# Patient Record
Sex: Female | Born: 1985 | Race: Black or African American | Hispanic: No | Marital: Single | Smoking: Current every day smoker
Health system: Southern US, Community
[De-identification: ages and names within clinical notes are randomized; demographics above are authoritative.]

---
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
## 2018-07-23 ENCOUNTER — Other Ambulatory Visit: Payer: Self-pay

## 2018-07-23 ENCOUNTER — Emergency Department (HOSPITAL_COMMUNITY)
Admission: EM | Admit: 2018-07-23 | Discharge: 2018-07-23 | Disposition: A | Discharge status: Home / Self Care | Payer: Medicaid Other | Attending: Emergency Medicine | Admitting: Emergency Medicine

## 2018-07-23 ENCOUNTER — Emergency Department (HOSPITAL_COMMUNITY): Payer: Medicaid Other

## 2018-07-23 ENCOUNTER — Encounter (HOSPITAL_COMMUNITY): Payer: Self-pay | Admitting: Emergency Medicine

## 2018-07-23 DIAGNOSIS — R079 Chest pain, unspecified: Secondary | ICD-10-CM | POA: Diagnosis present

## 2018-07-23 DIAGNOSIS — F17210 Nicotine dependence, cigarettes, uncomplicated: Secondary | ICD-10-CM | POA: Insufficient documentation

## 2018-07-23 NOTE — ED Triage Notes (Signed)
Pt reports chest spasms approximately 2 hours after eating yesterday. Pt states has had intermittent episodes since. Denies n/v/d/sob.

## 2018-07-23 NOTE — Discharge Instructions (Addendum)
Tests showed no life-threatening condition.  Suggest ibuprofen 2 to 3 tablets 3 times a day until pain gone.  Return if worse.

## 2018-07-27 NOTE — ED Provider Notes (Signed)
[facility] EMERGENCY DEPARTMENT Provider Note   CSN: [id number] Arrival date & time: 07/23/18  1051     History   Chief Complaint Chief Complaint  Patient presents with  . Chest Pain    HPI Jill Mora is a 32 y.o. female.  Chest pain intermittently since last night described as cramping, worse with position and palpation.  No dyspnea, diaphoresis, nausea.  No known history of cardiac disease.  She is a smoker.  Severity is mild.  No prolonged travel or immobilization     History reviewed. No pertinent past medical history.  There are no active problems to display for this patient.   History reviewed. No pertinent surgical history.   OB History   None      Home Medications    Prior to Admission medications   Not on File    Family History History reviewed. No pertinent family history.  Social History Social History   Tobacco Use  . Smoking status: Current Every Day Smoker    Packs/day: 1.00    Types: Cigarettes  . Smokeless tobacco: Never Used  Substance Use Topics  . Alcohol use: Not Currently    Comment: socially  . Drug use: Not on file     Allergies   Iodine; Ivp dye [iodinated diagnostic agents]; Morphine and related; and Shellfish allergy   Review of Systems Review of Systems  All other systems reviewed and are negative.    Physical Exam Updated Vital Signs BP 125/89 Comment: Simultaneous filing. User may not have seen previous data.  Pulse 66   Temp 98.7 F (37.1 C) (Oral)   Resp 18   LMP 06/14/2018   SpO2 100%   Physical Exam  Constitutional: She is oriented to person, place, and time. She appears well-developed and well-nourished.  HENT:  Head: Normocephalic and atraumatic.  Eyes: Conjunctivae are normal.  Neck: Neck supple.  Cardiovascular: Normal rate and regular rhythm.  Pulmonary/Chest: Effort normal and breath sounds normal.  Tender to palpation  Abdominal: Soft. Bowel sounds are normal.  Musculoskeletal:  Normal range of motion.  Neurological: She is alert and oriented to person, place, and time.  Skin: Skin is warm and dry.  Psychiatric: She has a normal mood and affect. Her behavior is normal.  Nursing note and vitals reviewed.    ED Treatments / Results  Labs (all labs ordered are listed, but only abnormal results are displayed) Labs Reviewed - No data to display  EKG EKG Interpretation  Date/Time:  Friday July 23 2018 11:05:29 EST Ventricular Rate:  79 PR Interval:    QRS Duration: 83 QT Interval:  374 QTC Calculation: 429 R Axis:   74 Text Interpretation:  Sinus rhythm ST elev, probable normal early repol pattern Baseline wander in lead(s) II III aVF V2 Confirmed by Donnetta Hutchingook, Kalika Smay ([id number]) on 07/23/2018 12:59:37 PM   Radiology No results found.  Procedures Procedures (including critical care time)  Medications Ordered in ED Medications - No data to display   Initial Impression / Assessment and Plan / ED Course  I have reviewed the triage vital signs and the nursing notes.  Pertinent labs & imaging results that were available during my care of the patient were reviewed by me and considered in my medical decision making (see chart for details).     Patient presents with chest pain.  She is low risk for ACS or PE.  EKG normal.  Chest x-ray shows atelectasis.  No clinical evidence of pneumonia.  Final Clinical  Impressions(s) / ED Diagnoses   Final diagnoses:  Chest pain, unspecified type    ED Discharge Orders    None       Donnetta Hutching, MD 07/27/18 [phone]

## 2018-10-13 ENCOUNTER — Encounter (HOSPITAL_COMMUNITY): Payer: Self-pay | Admitting: Emergency Medicine

## 2018-10-13 ENCOUNTER — Other Ambulatory Visit: Payer: Self-pay

## 2018-10-13 ENCOUNTER — Emergency Department (HOSPITAL_COMMUNITY)
Admission: EM | Admit: 2018-10-13 | Discharge: 2018-10-13 | Disposition: A | Discharge status: Home / Self Care | Payer: Medicaid Other | Attending: Emergency Medicine | Admitting: Emergency Medicine

## 2018-10-13 DIAGNOSIS — F17210 Nicotine dependence, cigarettes, uncomplicated: Secondary | ICD-10-CM | POA: Diagnosis not present

## 2018-10-13 DIAGNOSIS — K610 Anal abscess: Secondary | ICD-10-CM | POA: Insufficient documentation

## 2018-10-13 LAB — POC URINE PREG, ED: Preg Test, Ur: NEGATIVE

## 2018-10-13 MED ORDER — SULFAMETHOXAZOLE-TRIMETHOPRIM 800-160 MG PO TABS: 1.0000 | ORAL_TABLET | Freq: Two times a day (BID) | ORAL | 0 refills | Status: AC

## 2018-10-13 NOTE — Discharge Instructions (Addendum)
Wound Care - After I&D of Abscess  Bandages, if used, should be replaced daily or whenever soiled.    Cleaning: Clean the wound and surrounding area gently with tap water and mild soap. Rinse well and blot dry. You may shower normally. Soaking the wound in Epsom salt baths for no more than 15 minutes once a day may help rinse out any remaining pus and help with wound healing.  Clean the wound daily to prevent further infection. Do not use cleaners such as hydrogen peroxide or alcohol.  Please take all of your antibiotics until finished!   You may develop abdominal discomfort or diarrhea from the antibiotic.  You may help offset this with probiotics which you can buy or get in yogurt. Do not eat or take the probiotics until 2 hours after your antibiotic.   Scar reduction: Application of a topical antibiotic ointment, such as Neosporin, after the wound has begun to close and heal well can decrease scab formation and reduce scarring. After the wound has healed, application of ointments such as Aquaphor can also reduce scar formation.  The key to scar reduction is keeping the skin well hydrated and supple. Drinking plenty of water throughout the day (At least eight 8oz glasses of water a day) is essential to staying well hydrated.  Sun exposure: Keep the wound out of the sun. After the wound has healed, continue to protect it from the sun by wearing protective clothing or applying sunscreen.  Pain: You may use Tylenol, naproxen, or ibuprofen for pain.  Prevention: There is some people that have a predisposition to abscess formation, however, there are some things that can be done to prevent abscesses in many people.  Most abscesses form because bacteria that naturally lives on the skin gets trapped underneath the skin.  This can occur through openings too small to see. Before and after any area of skin is shaved, wax, or abraded in any manner, the area should be washed with soap and water and rinsed  well.   If you are having trouble with recurrent abscesses, it may be wise to perform a chlorhexidine wash regimen.  For 1 week, wash all of your body with chlorhexidine (available over-the-counter at most pharmacies). You may also need to reevaluate your use of daily soap as soaps with perfumes or dyes can increase the chances of infection in some people.  Follow up: Follow-up with the general surgeon on this matter.  Call to set up an appointment.  Return: Return to the ED sooner should signs of worsening infection arise, such as spreading redness, worsening puffiness/swelling, severe increase in pain, fever over 100.24F, or any other major issues.  For prescription assistance, may try using prescription discount sites or apps, such as goodrx.com  Your blood pressure was noted to be higher than normal today.  Please follow-up with your primary care provider for reevaluation and any further management of this issue.

## 2018-10-13 NOTE — ED Provider Notes (Signed)
[facility] EMERGENCY DEPARTMENT Provider Note   CSN: [id number] Arrival date & time: 10/13/18  2035    History   Chief Complaint Chief Complaint  Patient presents with  . Abscess    HPI Jill Mora is a 34 y.o. female.     HPI   Jill Mora is a 33 y.o. female, with a history of perianal abscess, presenting to the ED with  Perianal abscess that she has noted for the last 2 to 3 weeks.  She has had bloody and purulent drainage from the region.  Pain is sharp, moderate to severe, nonradiating. She has had an abscess in this region before which underwent I&D by a surgeon in [place], [place] a year ago. LMP December 2019. Denies fever/chills, N/V/C/D, abdominal pain, abnormal vaginal discharge, or any other complaints.      History reviewed. No pertinent past medical history.  There are no active problems to display for this patient.   History reviewed. No pertinent surgical history.   OB History   No obstetric history on file.      Home Medications    Prior to Admission medications   Medication Sig Start Date End Date Taking? Authorizing Provider  sulfamethoxazole-trimethoprim (BACTRIM DS,SEPTRA DS) 800-160 MG tablet Take 1 tablet by mouth 2 (two) times daily for 7 days. 10/13/18 10/20/18  Anselm Pancoast, PA-C    Family History No family history on file.  Social History Social History   Tobacco Use  . Smoking status: Current Every Day Smoker    Packs/day: 1.00    Types: Cigarettes  . Smokeless tobacco: Never Used  Substance Use Topics  . Alcohol use: Not Currently    Comment: socially  . Drug use: Not on file     Allergies   Iodine; Ivp dye [iodinated diagnostic agents]; Morphine and related; and Shellfish allergy   Review of Systems Review of Systems  Constitutional: Negative for fever.  Gastrointestinal: Negative for abdominal pain, blood in stool, constipation, diarrhea, nausea and vomiting.  Genitourinary: Negative for dysuria,  hematuria and vaginal discharge.  Musculoskeletal: Negative for back pain.  Skin: Positive for wound.  Neurological: Negative for weakness and numbness.  All other systems reviewed and are negative.    Physical Exam Updated Vital Signs BP (!) 183/104 (BP Location: Left Arm)   Pulse 89   Temp 98.1 F (36.7 C) (Oral)   Resp 20   SpO2 98%   Physical Exam Vitals signs and nursing note reviewed. Exam conducted with a chaperone present.  Constitutional:      General: She is not in acute distress.    Appearance: She is well-developed. She is not diaphoretic.  HENT:     Head: Normocephalic and atraumatic.     Mouth/Throat:     Mouth: Mucous membranes are moist.     Pharynx: Oropharynx is clear.  Eyes:     Conjunctiva/sclera: Conjunctivae normal.  Neck:     Musculoskeletal: Neck supple.  Cardiovascular:     Rate and Rhythm: Normal rate and regular rhythm.     Pulses: Normal pulses.  Pulmonary:     Effort: Pulmonary effort is normal. No respiratory distress.  Abdominal:     Palpations: Abdomen is soft.     Tenderness: There is no abdominal tenderness. There is no guarding.  Genitourinary:    Comments: Tenderness and small opening in the skin on the left medial buttock approximately 3 cm from the anal opening.  Minimal discharge present no noted fluctuance or  induration.  No erythema or increased warmth.  No tenderness to the anal sphincter or distal rectum. Musculoskeletal:     Right lower leg: No edema.     Left lower leg: No edema.  Lymphadenopathy:     Cervical: No cervical adenopathy.  Skin:    General: Skin is warm and dry.  Neurological:     Mental Status: She is alert.  Psychiatric:        Mood and Affect: Mood and affect normal.        Speech: Speech normal.        Behavior: Behavior normal.      ED Treatments / Results  Labs (all labs ordered are listed, but only abnormal results are displayed) Labs Reviewed  POC URINE PREG, ED     EKG None  Radiology No results found.  Procedures Procedures (including critical care time)  Medications Ordered in ED Medications - No data to display   Initial Impression / Assessment and Plan / ED Course  I have reviewed the triage vital signs and the nursing notes.  Pertinent labs & imaging results that were available during my care of the patient were reviewed by me and considered in my medical decision making (see chart for details).        Patient presents with a perianal abscess.  States this is actively draining.  We discussed methods to encourage continued drainage.  We will start antibiotic therapy and refer to general surgery. The patient was given instructions for home care as well as return precautions. Patient voices understanding of these instructions, accepts the plan, and is comfortable with discharge.  Vitals:   10/13/18 0855 10/13/18 1110  BP: (!) 183/104 (!) 166/102  Pulse: 89 71  Resp: 20 16  Temp: 98.1 F (36.7 C)   TempSrc: Oral   SpO2: 98% 97%   Patient's hypertension noted.  She does not appear to be symptomatic to this.  Doubt hypertensive emergency.  We will have her follow-up with her PCP on this matter.     Final Clinical Impressions(s) / ED Diagnoses   Final diagnoses:  Perianal abscess    ED Discharge Orders         Ordered    sulfamethoxazole-trimethoprim (BACTRIM DS,SEPTRA DS) 800-160 MG tablet  2 times daily     10/13/18 1122           Anselm Pancoast, [place] 10/13/18 1123    Tegeler, Canary Brim, MD 10/13/18 1729

## 2018-10-13 NOTE — ED Triage Notes (Signed)
Pt with history of peri-rectal abscess a year ago reports drainage without erythema and pain. Denies fevers. NAD at triage.

## 2019-09-15 ENCOUNTER — Ambulatory Visit
Admit: 2019-09-16 | Payer: MEDICAID | Attending: Student in an Organized Health Care Education/Training Program | Primary: Student in an Organized Health Care Education/Training Program

## 2019-09-15 DIAGNOSIS — T782XXA Anaphylactic shock, unspecified, initial encounter: Secondary | ICD-10-CM

## 2019-09-15 DIAGNOSIS — T1490XA Injury, unspecified, initial encounter: Secondary | ICD-10-CM

## 2019-09-15 DIAGNOSIS — Z809 Family history of malignant neoplasm, unspecified: Secondary | ICD-10-CM

## 2019-09-15 DIAGNOSIS — N926 Irregular menstruation, unspecified: Secondary | ICD-10-CM

## 2019-09-15 DIAGNOSIS — F419 Anxiety disorder, unspecified: Secondary | ICD-10-CM

## 2019-09-15 DIAGNOSIS — F1290 Cannabis use, unspecified, uncomplicated: Secondary | ICD-10-CM

## 2019-09-15 DIAGNOSIS — E669 Obesity, unspecified: Secondary | ICD-10-CM

## 2019-09-15 DIAGNOSIS — Z720 Tobacco use: Secondary | ICD-10-CM

## 2019-09-15 MED ORDER — EPINEPHRINE 0.3 MG/0.3 ML INJECTION, AUTO-INJECTOR
0.30.3 mg/ mL | Freq: Once | INTRAMUSCULAR | 6 refills | Status: DC | PRN
Start: 2019-09-15 — End: 2020-05-13

## 2019-09-15 MED ORDER — LEVONORGESTREL 0.15 MG-ETHINYL ESTRADIOL 30 MCG TABLETS,3 MOS PACK(91): 0.15 mg-30 mcg (91) | ORAL | 3 refills | Status: DC

## 2019-09-15 MED ORDER — SERTRALINE 25 MG TABLET: 25 mg | ORAL | 2 refills | Status: DC

## 2019-09-15 NOTE — Assessment & Plan Note (Signed)
Referral to weight mgmt clinic
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
## 2019-09-15 NOTE — Patient Instructions (Addendum)
Dear Ms Reichard,    It was good to see you in virtual clinic today. This document is your After Visit Summary, which includes what we discussed in clinic today, what the next steps are, and a list of your medications.     Things you should do after you leave clinic today:    For your mood symptoms:    - I will place referral for behavioral health navigator to contact you with list of in-network providers. They should reach out to you in the next weeks with in-network providers.  Please reach out to them to make an appointment    - Please start taking sertraline (zoloft) 25mg  once daily     - Our admin team will send you two questionnaires: GAD7 and PHQ9. Please complete    - We will set up a video visit with my practice partner for a check in next month     For your irregular periods:    - Please go to one of the [facility] labs to get the following tests done: TSH, FSH, prolactin, CBC    - Please start oral contraception pills (jolessa) one tab daily     For your allergies    - I sent in an epi pen prescription       For your obesity    - I will place a referral to weight management clinic. They will reach out to you to schedule an appointment       Healthcare maintenance:   - A1C  - Cholesterol panel     Today we ordered a set of blood tests.  Please go to one of the following locations for your labs.     IF THESE ARE 'FASTING LABS' (cholesterol, blood sugar), please do not eat for at least 10 hours prior to the test.  You CAN drink water, and you CAN take medicines before the lab test.    Laboratory [facility]  [address] First Floor  At the corner of [address]  Phone [phone]   Hours: Monday to Friday 7 a.m.  5:30 p.m.    Laboratory at [facility]  [address], First Floor  [place], [place]  Phone: [phone]  Hours: Monday to Friday 7:30 a.m.   6:30 p.m.    Blood Draw Lab at [facility]   [address] Floor   [place], [place]  [place]   Phone: [phone]   Hours: Monday to Friday, 7 a.m.  5:30 p.m.    Please confirm early or late hours by calling or checking ou website at  [email or web address]    You will receive you laboratory results either by MyChart, if you have registered, or by letter.  If there are urgent results, you will receive a call from me or from a nurse on my team. Please make sure we have the correct phone number for you at our front desk.            It was a pleasure seeing you today. Please don't hesitate to reach out if you have any additional questions or concerns.    Best,    [email or web address]

## 2019-09-15 NOTE — Telephone Encounter (Signed)
Dear Madlyn Frankel,     Could you please send Ms adylynn hertenstein and PHQ9 to complete via mychat and schedule her for a 2X video visit with my practice partner Dr. Cyndie Chime in 3-4 weeks to discuss her: anxiety (rxn to SSRIs), obesity, and irregular menstruation? Thank you very much for your help!    Best,    Shanda Bumps

## 2019-09-16 ENCOUNTER — Ambulatory Visit: Admit: 2019-09-16 | Payer: MEDICAID | Primary: Student in an Organized Health Care Education/Training Program

## 2019-09-16 DIAGNOSIS — N926 Irregular menstruation, unspecified: Secondary | ICD-10-CM

## 2019-09-16 LAB — PROLACTIN: Prolactin: 8.2 ug/L (ref 4.3–30.0)

## 2019-09-16 LAB — THYROID STIMULATING HORMONE: Thyroid Stimulating Hormone: 1.64 mIU/L (ref 0.45–4.12)

## 2019-09-16 LAB — HCG PREGNANCY, SERUM, QUANTITA: HCG Pregnancy, Serum, >= 18 ye: <1 IU/L (ref <5)

## 2019-09-16 NOTE — Result Quicknote (Signed)
Dear Kayla Frey,    Good news! Your FSH, prolactin, TSH and HCG (which was our initial workup for your irregular periods) have all come back in a healthy and normal range. We will see if your periods become more regular after starting the oral contraception pills and will continue to discuss this in your next appointment with me. Please feel free to reach out if you have any additional questions or concerns!    Best,    Park Liter, MD PGY-2

## 2019-09-18 LAB — FOLLICLE STIMULATING HORMONE,: Follicle stimulating hormone: 4 IU/L

## 2019-09-23 NOTE — Telephone Encounter (Signed)
Left a message and sent pt via mychart.  Scheduled VV on 2/18 and GAD7, PHQ9 questionnaire.

## 2019-09-26 DIAGNOSIS — F329 Major depressive disorder, single episode, unspecified: Secondary | ICD-10-CM

## 2019-09-26 DIAGNOSIS — F32A Depression, unspecified: Secondary | ICD-10-CM

## 2019-09-26 NOTE — Telephone Encounter (Signed)
Called and LM to pt in regards to scheduling error as New Patient in [facility]    Informed pt to check her Mychart for new appt time

## 2019-09-26 NOTE — Telephone Encounter (Signed)
Copied from CRM #[id number]. Topic: WM - Referral - New Renewal  >> Sep 26, 2019  8:31 AM Barnet Glasgow wrote:  REFERRAL / AUTHORIZATION TEMPLATE    PATIENT NAME: Kayla Frey  DATE OF BIRTH: August 19, 1986  MRN: [id number]    HOME PHONE NUMBER:    [phone]    ALTERNATE PHONE NUMBER:        LEAVING A MESSAGE OK?:        SPECIALTY:     wm  SPECIALTY NAME:       SPECIALTY ADDRESS:       SPECIALIST PHONE NUMBER:       SPECIALIST FAX NUMBER:       DATE OF APPOINTMENT:       REASON FOR VISIT:       SEND / FAX / MAIL REFERRAL TO:       INSURANCE INFORMATION:  PAYOR: [facility]   PLAN: [facility]  SUBSCRIBER INFO:         MESSAGE / PROBLEM:     Pt scheduled a new pt video appt with Dr. Michelle Piper on 3/23, but agent was unable to attach the referral due to a status of "closed".        MESSAGE GENERATED BY [facility]  CRM NUMBER [id number] CREATED BY:    Barnet Glasgow,     09/26/2019,      8:31 AM

## 2019-10-13 ENCOUNTER — Ambulatory Visit
Admit: 2019-10-14 | Payer: MEDICAID | Attending: Student in an Organized Health Care Education/Training Program | Primary: Student in an Organized Health Care Education/Training Program

## 2019-10-13 DIAGNOSIS — F419 Anxiety disorder, unspecified: Secondary | ICD-10-CM

## 2019-10-13 MED ORDER — ESCITALOPRAM 5 MG TABLET: 5 mg | ORAL | 3 refills | Status: DC

## 2019-10-13 NOTE — Progress Notes (Signed)
Completed a Gad-7 with pt.

## 2019-10-13 NOTE — Telephone Encounter (Signed)
Admin,   Please call to schedule a video appointment with me on 2/25 in the afternoon. Thanks!    Kayla Frey

## 2019-10-13 NOTE — Patient Instructions (Addendum)
-   pick up the lexapro 5mg  tablets and start taking them tomorrow morning.   - stop taking sertraline  - if tomorrow, when you take the lexapro in the morning you find that it makes you sleepy, you can switch to taking them at night (the next day, start taking them at night)  - someone will call you to make an appointment to see me in one week
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
## 2019-10-14 NOTE — E-Consult Note (Signed)
I am requesting an eConsult from psychiatry for this 34 y.o. woman with Anxiety.     My clinical question (including major comorbidities): Young woman with years of anxiety that manifests as obsessive thoughts about cleaning, and compulsions to clean. I'm having trouble diagnostically if this is GAD, OCD. Don't think OCPD or a delusional disorder since she finds her symptoms bothersome. Need help with diagnostics and next steps. Sertraline did not work, switched to [employer] today. No psychotic features.       The most current assessment of this problem can be found in the [facility] note dated 10/13/2019   The following studies are available in [facility]: TSH  Free T4       Date                     Value               Ref Range           Status                06/26/2000               11                  9 - 24 pmol/L       Final              Comment:    (by UC) ADD ON  ----------  Thyroid Stimulating Hormone       Date                     Value               Ref Range           Status                09/16/2019               1.64                0.45 - 4.12 mI*     Final              Comment:    Note: TSH results are flagged or not flagged according to reference intervals for non-pregnant patients. In pregnant patients, the upper limit of the TSH reference range in weeks 7-12 of the first trimester is approximately 0.5 mIU/L lower than in   non-pregnant patients and the reference interval is estimated to be 0.05 to 3.62 mIU/L.  During the 2nd and 3rd trimesters, the TSH reference range gradually returns towards the reference range for non-pregnant patients which is 0.45 - 4.12 mIU/L in the current assay.    ----------  If this clinical question is deemed too complex for eConsult, please, Schedule this patient for in-person consultation. The patient understands that she may receive a phone call from the specialty practice to schedule an appointment.    SPECIALIST'S E-CONSULT RESPONSE        Thanks for the e-consult. Classic OCD  around cleaning would focus on worries that things are out of place, or unhygienic/contaminated, and that she needs to clean them just so for them to be ok.  The interpersonal anger is much less common.  What does she think will happen if something is messy?    One way to further evaluate her symptoms around the OCD symptom cluster is to use the YBOCS - https://carter.com/  The last page is a list of common obsessions and compulsions, and I generally ask at least a couple in each category (both currently and in the past) to get a better sense of the scope of what's going on. People commonly don't volunteer aggressive or sexual obsessions unless you ask.   The first 2 pages then are used to track severity of symptoms through treatment, as you would with a PHQ9 or GAD7.      As another diagnostic possibility, people can also develop a psychological rigidity around things being cleaned just so, sometimes in response to having many things in life be completely out of her control.  If this is truly an isolated anxiety/frustration, I would ask about when this developed (if she knows), and what was going on in her life around that time.  This rigidity would more commonly be associated with interpersonal irritability than would OCD, and it could be helpful to ask about her internal monologue when someone else puts things out of place. What are the inner voice saying?  If rigidity seems to be more likely than OCD, therapy would probably be the best place to start.    I see that she was trialed on sertraline 25mg  and was fatigued without benefit.  Because this is such a low-dose of sertraline, I would say that she is intolerant of the medication (as compared to a failed trial of sertraline, which would require at least 4 weeks at sertraline 200mg ).  Lexapro is a fine medication to try next.  For OCD, we not uncommonly need higher doses of SSRIs to see benefit (rule of thumb  up to 1.5x FDA max).  If for depression we commonly see benefit in the lexapro 10-20mg  range, for OCD we're probably looking at the lexapro 20-40mg  range (and I would push it to 30mg  if possible before calling it a failed trial).  Similarly for sertraline for OCD, the range of effective doses is probably 150-300mg  daily.  Lexapro tends to be a bit more sedating than sertraline on average, so be quick to switch it to bedtime.  If she finds that too sedating, I would try fluoxetine next (most activating of the SSRIs).    Lenard Simmer Samelson-Jones, MD        I spent 21-30 minutes reviewing and completing thiseConsult.      This eConsult is based on the clinical data available to me and is furnished without benefit of a comprehensive evaluation or physical examination.  The above will need to be interpreted in light of any clinical issues, or changes in patient status, not available to me at the time of filing this eConsult.  Please alert me if you have further questions.

## 2019-10-14 NOTE — Telephone Encounter (Signed)
Spoke with pt and scheduled appt for 2/25 @ 2pm.

## 2019-10-21 NOTE — Telephone Encounter (Signed)
Appointment already scheduled for 3/31 @ 3:30pm.

## 2019-11-15 ENCOUNTER — Ambulatory Visit
Admit: 2019-11-15 | Discharge: 2019-11-23 | Payer: MEDICAID | Attending: Physician | Primary: Student in an Organized Health Care Education/Training Program

## 2019-11-15 DIAGNOSIS — Z6841 Body Mass Index (BMI) 40.0 and over, adult: Secondary | ICD-10-CM

## 2019-11-15 DIAGNOSIS — F419 Anxiety disorder, unspecified: Secondary | ICD-10-CM

## 2019-11-15 NOTE — Patient Instructions (Addendum)
Goals after this visit:  -- Referral placed to surgery clinic to discuss bariatric surgery and begin insurance approval process.    --Info on the [facility] Bariatric Support Group  Contact: Email [email or web address]    This [place] website has more info about the surgeries:  [email or web address]    Medical Office Building 1  (Ambulatory Care Center)  [facility]  [address]  Sixth Floor, Reception Desk [address]  [place], [place] [address]  Phone (existing patients): [phone]  Phone (new patients): [phone]   Fax: [phone]

## 2019-11-15 NOTE — Progress Notes (Signed)
Kayla Frey is a 34 y.o. female presenting as a new patient in the weight management program.    Obesity: Patient here with obesity.  Main purpose for today's visit is a medical intake.     Referred by: Elsie Amis, MD    Longterm Goals: Healthier lifestyles, less body aches, live longer.  Improve knee, back and hip pain.      Video visit.      Weight History:      Has always had excess weight.  In her 40's weight was the same.  Has tried various diets but weight always came back.  Family hx of excess weight.  Tried Atkins diet in the past.  Did not work.  At most lost 5#.  Affected her mood.      Aunt had gastric bypass surgery.    Lowest weight as an adult age 63, 255#.  Currently 400# weighed 2 months ago.     Raised by her aunt from age 24 to 28.    As a childhood was food restricted bc her aunt had issues with her weight. At age 86 was 79 with her ribs showing.  5'7".      Does not eat junk food.      Shellfish and nut allergy.  Walnuts especially.  But most nuts do something.    Iodine and morphine gave her hives and needed IV medication treatment.    Shares a home with her boyfriend. Working with Dr. Denna Haggard.  Only recently started Lexapro.  Helping.  Zoloft made her sleepy and without energy.    Interested in weight loss surgery.  Cousin had a sleeve.  Aware of the peri op changes to eating.        Other Potential Contributing Factors/PMH  Thyroid?  None.  Musculoskel problem? Knees, back and hips  Use of medications that may cause weight gain On OCP only recently.  No steroids.    Use of alcohol: average 0 drinks/week, minimal.  Psych History: depressed mood   Sleep "broken"  Wakes up in the middle of the night. Has always been this way.  +snoring.     Medical History (see [facility] problem list).     Eating Habits/Nutritionist:   As above.    Exercise Habits:   Had a membership at a gym and would go swimming 3x per week.  Walks her dog.  Since COVID doing on demand videos.     Likes to go fishing.     Review of Systems:  Musculoskel: Joint pain: as above.  Other systems were reviewed and are negative.     Patient's allergies, medications, past medical, surgical, social and family history were updated and reviewed as appropriate.     Physical Examination:  Ht 170.8 cm (5' 7.25") Comment: PT SELF REPORTED  Wt (!) 181.4 kg (400 lb) Comment: Self reported home weight  BMI 62.18 kg/m , Body mass index is 62.18 kg/m.  Vital signs reviewed.     Gen/Constitutional: Well developed, well nourished, no diaphoresis, no distress  Rest of exam deferred due to telehealth visit.    Labs reviewed by me today:  Component      Latest Ref Rng & Units 09/16/2019   Prolactin      4.3 - 30.0 ug/L 8.2   HCG Pregnancy, Serum, >= 18 years      <5 IU/L <1   Follicle stimulating hormone      IU/L 4.0   Thyroid Stimulating Hormone  0.45 - 4.12 mIU/L 1.64       Assessment/Plan:  34 y.o.  with BMI Body mass index is 62.18 kg/m. and longterm goal to:     New problem to me:     Obesity. Weight: (!) 181.4 kg (400 lb)(Self reported home weight) Body mass index is 62.18 kg/m.  Severe obesity.  Hereditary in etiology in part.  Severe.  At high risk for associated medical conditions.      Patient interested in:  Step by Step and Meal Replacement Program. Pt interested in bariatric surgery and has done much research on this.  Has 2 family members who have previously undergone bariatric surgery.  Insurance will require that pt undergo 6 months of weight management before approving surgery.  Will also start referral to bariatric surgery clinic and encourage patient to attend their group classes.  Will send pt the info.  Additionally pt can benefit from OSA evaluation.    - FU monthly with Dr Gianfranco Araki Piper  - In future will discuss barriers and problem areas for patient, weight loss principles, eating and exercise habits again.     - NEW GOAL(S):    -- Referral placed to surgery clinic to discuss bariatric surgery and begin insurance approval process.    --Info on the [facility] Bariatric Support Group  Contact: Email [email or web address]    This [place] website has more info about the surgeries:  [email or web address]    Medical Office Building 1  (Ambulatory Care Center)  [facility]  [address]  Sixth Floor, Reception Desk [address]  [place], [place] [address]  Phone (existing patients): [phone]  Phone (new patients): [phone]   Fax: [phone]      Other Medical Problems, (new to me):   Depression and Anxiety  Engaged in care.  Did not tolerate Zoloft.  Tolerating Lexapro.    Irregular menses likely PCOS  On OCP's to regulate menses.  OCP's not contributing to weight gain.     Tobacco use  Discuss further at next visit.    +Snoring  Consider OSA evaluation.    Pt entered our program on: 11/15/19  Starting weight was (!) 181.4 kg (400 lb)  Max weight 400 lb    Return in about 4 weeks (around 12/13/2019) for [facility], weight management, VV.      I spent a total of 51 minutes on this patient's care on the day of their visit excluding time spent related to any billed procedures. This time includes face-to-face time with the patient as well as time spent documenting in the medical record, reviewing patient's records and tests, obtaining history, placing orders, communicating with other healthcare professionals, counseling the patient, family, or caregiver, and/or care coordination for the diagnoses above.

## 2019-11-23 ENCOUNTER — Ambulatory Visit
Admit: 2019-11-23 | Payer: MEDICAID | Attending: Student in an Organized Health Care Education/Training Program | Primary: Student in an Organized Health Care Education/Training Program

## 2019-11-23 DIAGNOSIS — Z6841 Body Mass Index (BMI) 40.0 and over, adult: Secondary | ICD-10-CM

## 2019-11-23 DIAGNOSIS — L709 Acne, unspecified: Secondary | ICD-10-CM

## 2019-11-23 DIAGNOSIS — Z0000 Encounter for general adult medical examination without abnormal findings: Secondary | ICD-10-CM

## 2019-11-23 DIAGNOSIS — L700 Acne vulgaris: Secondary | ICD-10-CM

## 2019-11-23 DIAGNOSIS — N926 Irregular menstruation, unspecified: Secondary | ICD-10-CM

## 2019-11-23 DIAGNOSIS — F419 Anxiety disorder, unspecified: Secondary | ICD-10-CM

## 2019-11-23 MED ORDER — TRETINOIN 0.025 % TOPICAL CREAM: 0.025 % | TOPICAL | 5 refills | Status: DC

## 2019-11-23 MED ORDER — ESCITALOPRAM 5 MG TABLET: 5 mg | ORAL | 3 refills | Status: DC
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
## 2019-11-23 NOTE — Patient Instructions (Addendum)
Dear Kayla Frey,    It was good to see you in clinic today. This document is your After Visit Summary, which includes what we discussed in clinic today, what the next steps are, and a list of your medications.     Things you should do after you leave clinic today:    For your mood symptoms:    - Please INCREASE your lexapro from 5mg  to 10mg  every day    - Please establish care with a therapist     - Please send me a mychart message in 2-3 weeks if you are still having anxiety and obsessive thoughts for most of the day and we can increase your lexapro dose    - Please complete the PHQ9 and GAD7 questionnaires at your earliest convenience    - Let's have a follow up visit in May to check in on your mood symptoms. I have sent you a scheduling ticket on mychart that allows you to schedule a visit with me. Anytime in the next 2-3 months would be reasonable. If for some reason you are unable to schedule, please reply to this message or give a call at [phone].    For your healthcare maintenance:    - Please get the following labs completed at your earliest convenience at any of the [facility] labs: HIV, HCV, cholesterol panel, A1C    For your acne:    - Please START tretinoin cream to affected areas (application instructions below)    INSTRUCTIONS FOR APPLYING TOPICAL RETINOIDS     1. The entire face will only require a pea-sized amount of the medication.  2. Wait 20-30 minutes after gently cleansing your face at night before applying the retinoid.  3. Gently apply a small amount of the medication to all the areas on your face where you are breaking out, not just on individual pimples, just enough to barely cover the area.   4. Begin by applying the medication every other night. After two weeks, if there is no redness irritation, increase application to every night.  5. If your skin becomes overly chapped or irritated, then apply the  medication less often (i.e. every 2-3 days)  6. If you apply the mediation too soon after  washing your face or you apply too much medication, you will increase the amount of redness and irritation without increasing the benefit.  7. You will need to use a broad spectrum sunscreen to protect your skin from burning, since the retinoid will make you more sun sensitive.   8. Wash your face gently with Dove, Purpose or Cetaphil soap. AVOID washcloths, facial scrubs, astringents and any other over-the-counter products that dry or irritate the skin and might make it more difficult to use the retinoid.   9. For dryness, use Cetaphilor Aquanil lotion in the morning as a soap substitute. These lotions may be gently rinsed or wiped off. Alternatively, these lotions may be left on the skin without increasing the risk of worsening the acne.  10. Avoid cosmetic surgical procedures (microdermabrasion, dermabrasion) or waxing while using topical retinoids.      It was a pleasure seeing you today. Please don't hesitate to reach out if you have any additional questions or concerns.    Best,    [place], MD PGY-2

## 2019-12-01 NOTE — Telephone Encounter (Signed)
Re: ePA for tretinoin (RETIN-A) 0.025 % cream   Is send for review.    [facility] OF#[id number].    Thank you.  Renea Ee

## 2019-12-02 NOTE — Telephone Encounter (Signed)
Re: ePA for tretinoin (RETIN-A) 0.025 % cream is Approved.  -Per insurance Approved  Upload External Attachments  Prior authorization approved   Case ID: [id number]      Payer:  [employer]   Approved - TRETINOIN 0.025% CREAM a quantity of 20 for 30 days has been approved indefinitely on 12/01/2019     Approval Details    Authorized from December 01, 2019 to August 25, 2097        -Informed patient via my chart.      Thank you.  Renea Ee

## 2019-12-07 ENCOUNTER — Ambulatory Visit
Discharge: 2019-12-12 | Payer: MEDICAID | Attending: Physician | Primary: Student in an Organized Health Care Education/Training Program

## 2019-12-07 DIAGNOSIS — Z6841 Body Mass Index (BMI) 40.0 and over, adult: Secondary | ICD-10-CM

## 2019-12-07 DIAGNOSIS — Z720 Tobacco use: Secondary | ICD-10-CM

## 2019-12-07 NOTE — H&P (Addendum)
HPI  Thank-you for referring Kayla Frey to the [facility] at the [place], [place].  As you know, she is a 34 year old with severe obesity and obesity-related comorbidities who presents for consideration of bariatric surgery.  This is the patient's first visit to the [facility].    Today's Weight: (!) 181.4 kg (400 lb).  Her BMI (body mass index) today is calculated at 62.65, based on her disclosed height of: 5\' 7" .  Her goal weight is 220 pounds.     She has tried multiple conservative measures to achieve weight loss, including dietary, pharmacologic, or exercise programs. Although she has been able to lose some weight from diets and exercise, over time, the weight has always returned, and she has remained morbidly obese.  She has reviewed the [place] new bariatric patient orientation videos in order to understand the risks and the benefits of bariatric surgery. She now feels committed to proceeding with an operation to help with the weight loss and presents today for further consideration of bariatric surgery.    She is being followed by Dr. Luvenia Heller in the [facility] program; last visit 11/15/19.  She is actively smoking.  She is trying to cut back.  No history of prior surgeries.        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   Allergies/Contraindications   Allergen Reactions    Iodine     Morphine      Current Outpatient Medications   Medication Sig Dispense  Refill    EPINEPHrine (EPIPEN) 0.3 mg/0.3 mL injection Inject 0.3 mL (0.3 mg total) into the muscle once as needed for Anaphylaxis for up to 1 dose Use as instructed (Patient not taking: Reported on 10/13/2019  ) 1 each 6    escitalopram oxalate (LEXAPRO) 5 mg tablet Take 2 tablets (10 mg total) by mouth every morning 30 tablet 3    levonorgestrel-ethinyl estradiol (JOLESSA) 0.15 mg-30 mcg (91) tablet Take 1 tablet by mouth daily 84 tablet 3    tretinoin (RETIN-A) 0.025 % cream Apply topically nightly at bedtime Use as instructed 20 g 5     No current facility-administered medications for this visit.      Social History     Socioeconomic History    Marital status: Single     Spouse name: Not on file    Number of children: Not on file    Years of education: Not on file    Highest education level: Not on file   Occupational History    Not on file   Social Needs    Financial resource strain: Not on file    Food insecurity     Worry: Not on file     Inability: Not on file    Transportation  needs     Medical: Not on file     Non-medical: Not on file   Tobacco Use    Smoking status: Current Every Day Smoker     Packs/day: 0.50     Years: 6.00     Pack years: 3.00     Types: Cigarettes     Start date: 09/14/2014    Smokeless tobacco: Never Used   Substance and Sexual Activity    Alcohol use: Not Currently     Alcohol/week: 0.0 standard drinks     Comment: socially     Drug use: Never    Sexual activity: Yes     Partners: Male     Birth control/protection: OCP   Lifestyle    Physical activity     Days per week: Not on file     Minutes per session: Not on file    Stress: Not on file   Relationships    Social connections     Talks on phone: Not on file     Gets together: Not on file     Attends religious service: Not on file     Active member of club or organization: Not on file     Attends meetings of clubs or organizations: Not on file     Relationship status: Not on file    Intimate partner violence      Fear of current or ex partner: Not on file     Emotionally abused: Not on file     Physically abused: Not on file     Forced sexual activity: Not on file   Other Topics Concern    Not on file   Social History Narrative    Not on file     Family History   Problem Relation Name Age of Onset    Mental health problem Mother Mom     Drug abuse Mother Mom     Mental illness Mother Mom     Cancer - colon Father Dad     High blood pressure Father Dad     Drug abuse Father Dad     Colon cancer Father Dad     Heart disease Father Dad     Depression Brother Brother     Anxiety disorder Brother Brother     Mental health problem Brother Brother     Alcoholism Brother Brother     Asthma Brother Brother     Mental illness Brother Brother     Cancer - ovary Maternal Grandmother Jo ann     Cancer - lung Maternal Grandmother Alvino Chapel ann     Alcoholism Maternal Grandmother Alvino Chapel ann     Cancer Maternal Grandmother Jo ann     Cancer - colon Maternal Grandfather      Colon cancer Paternal Dallie Dad     Early death Paternal Caryn Bee      Physical Exam  Vitals:    12/07/19 0913   Weight: (!) 181.4 kg (400 lb)   Height: 170.2 cm (5\' 7" )     Body mass index is 62.65 kg/m.  Constitutional:       Appearance: Patient is well-developed. Patient is not diaphoretic.   Pulmonary:      Effort: Pulmonary effort is normal. No respiratory distress.   Abdominal:      General: She is obese   Skin:     Coloration: Skin is not pale.   Neurological:      Mental Status:She  is  alert and oriented to person, place, and time.   Psychiatric:         Behavior: Behavior normal.         Thought Content: Thought content normal.         Judgment: Judgment normal.     Duke Activity Survey Index  DASI 12/06/2019   Can you take care of yourself (eating dressing bathing or using the toilet)? 2.75 Yes   Can you walk indoors such as around your house? 1.75 Yes   Can you walk a block or two on level ground? 2.75 Yes   Can you climb a flight of  stairs or walk up a hill? 5.50 Yes   Can you run a short distance? 8.00 No   Can you do light work around the house like dusting or washing dishes? 2.70 Yes   Can you do moderate work around the house like vacuuming, sweeping floors, or carrying in groceries? 3.50   Yes   Can you do heavy work around the house like scrubbing floors or lifting and moving heavy furniture? 8.00 Yes   Can you do yard work like raking leaves, weeding, or pushing a Financial controller? 4.50 Yes   Can you have sexual relations? 5.25 Yes   Can you participate in moderate recreational activities like golf, bowling, dancing, doubles tennis, or throwing a baseball or football? 6.00 Yes   Can you participate in strenuous sports like swimming, singles tennis, football, basketball, or skiing? 7.50 No   Total Score (DASI) 42.7   Estimate peak 02 27.96   METS 7.99     This is a shared visit for services provided by me, Barbara Cower MD.  I performed a face-to-face encounter with the patient and the following portion of the note is my own:      Assessment and Plan:  In summary, Kayla Frey is a 33 year old female with morbid obesity and associated comorbidities.  She has a BMI of 62.65.  She presents for consideration of bariatric surgery.  She does satisfy the criteria set forth for bariatric surgery by the [facility].    Prior to scheduling surgery, the following tests will be needed. This can be done at [facility], or in conjunction with the patient's PCP.  __ ECG to document preoperative baseline, and completion of the Duke Activity Survey Index questionnaire in clinic.  (If patient exercise tolerance is <4 METs, patient will require a cardiac stress test)  __ Labs: CBC, CMP, lipid panel, iron panel, vitamin D 25OH, PT/INR, PTT, hemoglobin A1c, TSH, PTH    __ H. pylori screening antibody. If positive, infection can be confirmed with urea breath test or stool antigen and the infection should be treated.  __ Evaluation by a mental health  care practitioner (psychiatrist, psychologist, or therapist).  __ Evaluation by a nutritionist or dietician.  __ [facility] requires 3 months of a supervised weight loss program with monthly weigh-ins completed within the past 2 years.  This can be completed in the Weight Management program with Dr. Michelle Piper.    __ Follow up in the [facility] in 1 month  __ Evaluation by a Primary Care Provider and letter of recommendation. The letter should document your dieting history and that all health maintenance screening, particularly cancer screenings, are up-to-date.    I have provided the patient with the list of these required tests and consultations.    We discussed benefits and risks of gastric bypass surgery.  We explained  a complication rate of 12%, and a mortality rate of 0.2%.  Specific complications were explained in detail, including leakage from the gastrojejunostomy or jejunojeunostomy, pulmonary embolism, pneumonia, deep vein thrombosis, renal failure, stroke, heart attack, hernia, wound infection, bleeding, nausea, marginal ulceration, bowel obstruction, persistent obesity, weight regain, and dumping syndrome. We also explained our current understanding of how gastric bypass works: the small gastric pouch restricts oral intake. Additionally, gastrointestinal hormonal secretion is altered, which in turn increases satiety.  We also explained that the excluded stomach cannot be visualized by regular endoscopy after the gastric bypass, nor is the biliary tree accessible by endoscopy. Also, we explained the possibility for conversion to an open procedure because of extensive adhesions, bleeding, or inadequate exposure.  We quoted a likelihood for conversion to be less than 2%.    We discussed sleeve gastrectomy as another weight-loss option.  Specific complications of sleeve gastrectomy include nausea or vomiting, leakage (<1%), bleeding (<2%), heartburn (30-40%), and weight-regain.  The mortality rate is 0.1%.    We  explained the eventual need for plastic surgery for excessive skins folds after weight loss. Additionally, we spoke about the need for consistent long-term postoperative follow-up with scheduled clinic visits and laboratory tests, changes in lifestyle, diet, and eating habits and the need for supplemental calcium and vitamins was explained.    The patient understands that surgery is only a tool to help with weight loss, and that she will need to maintain a carefully organized and attentive nutritional program with the help of a dietician both before and after surgery.  She understands these plans, the technical aspects of surgery and risks involved, again including both those associated with anesthesia and surgery.  The patient was given the opportunity to ask questions, and all questions were answered.    After our thorough discussion today, she is primarily interested in the laparoscopic sleeve gastrectomy procedure, and this discussion will be reaffirmed at our next clinic visit.    Documentation of Visit Statement  I spent a total of 45 minutes in face-to-face time with the patient and in non-face-to-face activities conducted today, 12/07/19, directly related to this video visit, including reviewing records and tests, obtaining history and exam, placing orders, communicating with other healthcare professionals, counseling the patient, family, or caregiver, documenting in the medical record, and/or care coordination for the diagnoses above.    I performed this consultation using real-time Telehealth tools, including a live video connection between my location and the patient's location. Prior to initiating the consultation, I obtained informed verbal consent to perform this consultation using Telehealth tools and answered all the questions about the Telehealth interaction.  I performed this visit outside a  faciliity.    This is a shared service.

## 2019-12-07 NOTE — Telephone Encounter (Signed)
Referral pended please sign if appropriate.

## 2019-12-07 NOTE — Patient Instructions (Signed)
Bariatric Surgery: New Patient Information     Welcome to the [facility]!  Your surgeon has asked that you complete this list of tests and studies before you can be a candidate for bariatric surgery.  You do not need to repeat any of these tests if they have already been done in the last 1-2 years.  Just send [place] a copy of the final report for [place] to review.    Prior to scheduling surgery, the following tests will be needed. This can be done at [place], or in conjunction with the patient's PCP.  __ ECG to document preoperative baseline, and completion of the [facility] Activity Survey Index questionnaire in clinic.  (If patient exercise tolerance is <4 METs, patient will require a cardiac stress test)  __ Labs: CBC, CMP, lipid panel, iron panel, vitamin D 25OH, PT/INR, PTT, hemoglobin A1c, TSH, PTH    __ H. pylori screening antibody. If positive, infection can be confirmed with urea breath test or stool antigen and the infection should be treated.  __ Evaluation by a mental health care practitioner (psychiatrist, psychologist, or therapist).  __ Evaluation by a nutritionist or dietician.  __ [facility] requires 3 months of a supervised weight loss program with monthly weigh-ins completed within the past 2 years.  This can be completed in the Weight Management program with Dr. Michelle Piper.    __ Follow up in the [facility] in 1 month  __ Evaluation by a Primary Care Provider and letter of recommendation. The letter should document your dieting history and that all health maintenance screening, particularly cancer screenings, are up-to-date.    In addition, your insurance company may have additional requirements.  You should contact your insurance company now, to ensure that they cover bariatric surgery for you, and what their requirements are.    What should I do after my pre-operative items are complete?  Contact our clinic by phone or through MyChart.  If you completed your pre-operative items outside of [place],  please let us know so we can help determine if copies of the results need to be faxed our mailed to [place].    [facility]  Surgery Faculty Practice  [address] [place], [place] [address]  tel: [phone], option 5  fax: [phone]  web: [email or web address]    Once the results have been reviewed by our team, we will schedule a pre-operative video visit appointment with your surgeon. It is at this appointment that a surgery date will be scheduled as well.       Bariatric Surgery 101: The Basics  Bariatric surgery is intended for people who have a Body Mass Index (BMI) of 40 or greater and who have not had success with other weight loss therapies such as diet, exercise, and medications. A person with a BMI of 35 or greater, with one or more obesity related co-morbidity, may also be considered for bariatric surgery.     Bariatric surgery has a history of helping patients effectively transform their health. Bariatric surgery restricts the amount of food patients can eat and makes them feel satiated much earlier in the digestive process. As a tool, bariatric surgery has impressive long-term weight loss results and, in many cases, has resolved or improved associated diseases like diabetes, high blood pressure, sleep apnea, painful joints, and stress urinary incontinence.      Bariatric surgery can give you a chance to change your life, provided you are ready to work hard  and make a renewed and lasting commitment to a healthy lifestyle!    There are some important things to know about bariatric surgery:     Bariatric surgery is not cosmetic surgery.   Bariatric surgery does not involve the removal of adipose tissue (fat) by suction or surgical removal.   You must understand the benefits and risks.   You must commit to long-term lifestyle changes, including diet and exercise, which are key to the success of bariatric surgery.   Complications after surgery may  require further operations.    What are the Bariatric Procedures Offered at [facility]?    Gastric Bypass Surgery  The Roux-en-Y Gastric Bypass  often called gastric bypass  is considered the gold standard of weight loss surgery.    The Procedure  There are two components to the procedure. First, a small stomach pouch, approximately one ounce or 30 milliliters in volume, is created by dividing the top of the stomach from the rest of the stomach. Next, the first portion of the small intestine is divided, and the bottom end of the divided small intestine is brought up and connected to the newly created small stomach pouch. The procedure is completed by connecting the top portion of the divided small intestine to the small intestine further down so that the stomach acids and digestive enzymes from the bypassed stomach and first portion of small intestine will eventually mix with the food.    The gastric bypass works by several mechanisms. First, similar to most bariatric procedures, the newly created stomach pouch is considerably smaller and facilitates significantly smaller meals, which translates into less calories consumed. Additionally, because there is less digestion of food by the smaller stomach pouch, and there is a segment of small intestine that would normally absorb calories as well as nutrients that no longer has food going through it, there is probably to some degree less absorption of calories and nutrients.    Most importantly, the rerouting of the food stream produces changes in gut hormones that promote satiety, suppress hunger, and reverse one of the primary mechanisms by which obesity induces type 2 diabetes.    Advantages   Produces significant long-term weight loss (60 to 80 percent excess weight loss)   Restricts the amount of food that can be consumed   May lead to conditions that increase energy expenditure   Produces favorable changes in gut hormones that reduce appetite and enhance satiety    Typical maintenance of >50% excess weight loss    Disadvantages   From a surgical perspective, it is a more technically complex operation when compared to the sleeve.  The complication rates, while very low, are about double those of the sleeve.     With the malabsorption component, one can develop micronutrient deficiencies, particularly deficits in vitamin B12, iron, calcium, and folate.  These can be prevented with a daily multivitamin.   Requires adherence to dietary recommendations, life-long vitamin/mineral supplementation, and follow-up compliance    "The Sleeve"  The Sleeve Gastrectomy  often called "the sleeve"  is performed by removing approximately 80 percent of the stomach. The remaining stomach is a tubular pouch that resembles a banana.    The Procedure  This procedure works by several mechanisms. First, the new stomach pouch holds a considerably smaller volume than the normal stomach and helps to significantly reduce the amount of food (and thus calories) that can be consumed. The greater impact, however, seems to be the effect the surgery has on  gut hormones that impact a number of factors including hunger, satiety, and blood sugar control.    Short term studies show that the Sleeve is as effective as the Roux-en-Y gastric bypass in terms of weight loss.     Advantages   Restricts the amount of food the stomach can hold   Induces rapid and significant weight loss similar to Roux-en-Y gastric bypass for the first few years.  We are now starting to gain more long-term research data about this procedure.     Requires no re-routing of the food stream (Gastric Bypass)   Involves a relatively short hospital stay.  Typically, 1 night or less.     Causes favorable changes in gut hormones that suppress hunger, reduce appetite and improve satiety    Disadvantages   Is a non-reversible procedure   Has the potential for long-term vitamin deficiencies, although less likely when compared to the  gastric bypass.   The long-term results not as well understood given the sleeve is a newer procedure.     About a 30% chance of new or worsening heartburn symptoms after surgery.  Symptoms can typically be controlled with acid reducing medication; however, uncontrolled symptoms may require an additional surgery.     Lap Band Removal   The Adjustable Gastric Band  often called the band  involves an inflatable band that is placed around the upper portion of the stomach, creating a small stomach pouch above the band, and the rest of the stomach below the band.    This operation has fallen out of favor in the [place] and across most of the world due to high failure and complication rates.  While we no longer place lap bands, we can remove the band and convert to another bariatric surgery, such as the sleeve.  While each patient is unique, the conversion can possibly be completed in one surgery or may need to be staged as two separate surgeries.       How can I decide what procedure is right for me?  Your surgeon will evaluate your overall medical condition and typically recommend one procedure over another.  But the final decision is yours - you need to choose a procedure you are comfortable with, after you have researched the risks, benefits, and differences between them.    To help inform your decision, we offer the following resources:   EMMI educational videos: These videos provided easily digestible information to help you understand each surgery.  If you do not receive a link for this, please contact our office.    Our New Patient Orientation video on our website: [email or web address]   The [facility] website: [email or web address]   [facility] Support Group This offered through our Center for pre-operative and post-operative patients, as well as for family members and other interested persons. The meetings are virtual and held on [place] at [place] every 3rd Wednesday of the  month.  The meeting schedule and lecture topics are available on the website above. Email [place] at: [email or web address]. Registration is not necessary to attend the support group.     Why should I establish care with a nutritionist or dietician?  The best long-term success after weight-loss surgery occurs in patients who regularly see a nutritionist and change their eating habits for good.    The purpose of the pre-operative evaluation is to establish this relationship and improve your understanding of essential concepts in nutrition, such as food composition, caloric content, and calorie density. A basic knowledge  of the proportion of carbohydrate, protein, and fat found in different foods, as well as training in portion control, is essential for your long-term success. Your nutritionist will also assess your readiness to change dietary habits in order to maintain a low calorie, volume-restricted diet for life.    Why is a mental health evaluation needed?  Weight-loss surgery requires a lifelong commitment to nutritional supplementation and regular visits to your bariatric surgeon and primary care provider.  Some patients cannot make this commitment because of mental illness, depression, substance abuse, financial stress, or other life-stressors.    The purpose of your mental health evaluation is to assess whether you are mentally and emotionally prepared for the surgery, understand that the surgery is not reversible, and also understand the risks and alternatives. It should specifically address the following issues:   Screening for any of these psychiatric conditions: psychosis, major depression, obsessive-compulsive disorder, major eating disorder (such as bulimia), alcohol abuse, or substance abuse.   Establish your competency in making the decision to proceed with weight loss surgery.    Establish that you fully comprehend the seriousness of the procedure and the lifestyle and behavioral changes  that must take place after it is done;    Establish that you have realistic expectations about the outcome of surgery and can handle the stress of the period following surgery.    If I smoke, can I still have weight loss surgery?  If you smoke, it is very important that you stop smoking at least 6 weeks prior to your surgery.  This will help to improve healing and decrease the incidence of post-operative complications.    What do I do if my insurance coverage changes?  If you have an insurance change, please contact our office and provided [place] with the updated information.  Candidacy for this procedure depends upon adequate insurance coverage and the ability to obtain the required medications after discharge. About four to six weeks before your scheduled surgery, we will request verification of coverage from your insurance plan.    What if I have questions?  We will be happy to answer any questions you may have while you are awaiting your procedure.  Should you have any additional questions, please contact us at [phone], option 5.    Preparing For Bariatric Surgery  To help make the surgical process as straightforward as possible, we have created a patient education guide called What to Expect: Bariatric Surgery, which you should receive after your pre-operative appointment.  See below for some highlights:    After you meet your surgeon for the pre-operative appointment, their practice coordinator will give you specific instructions about the date and time of the operation. Information that will be reviewed at the visit:     You will go on a preoperative liquid diet for 2 weeks prior to surgery.  You can choose to buy premade shakes, such as Premiere Protein Shakes, or purchase a protein powder and mix yourself.   You will drink three shakes a day.  Other liquids such as broth, coffee, popsicles, and jello are ok during this time, but no solid food.  The liquid diet helps to kickstart weight loss and shrink  the size of the liver, which makes the operation easier for the surgeon to perform.   You will need to stop taking any nonsteroidal anti-inflammatory drugs (NSAIDs) one week before your scheduled operation date. Plan to never take these medicines ever again because you will now be at an increased  risk of gastro-intestinal ulcers. Examples of nonsteroidal anti-inflammatory drugs (NSAIDs) are: Naprosyn, Ibuprofen, Aleve, Aspirin, Celebrex, Advil/Motrin.   Your Anesthesia Prepare provider will give you detailed instructions regarding which of your medications to take or hold before surgery.   On the day before your operation you should have nothing to eat or drink after midnight. You will also take a mild bowel prep, Magnesium Citrate, around 10am the morning before surgery.  This is an over the counter medication.      Your Stay in the Hospital    What to bring  Please bring a photo I.D. with you to the hospital. We recommend that you leave all valuables at home. If you have sleep apnea and use a CPAP machine at night, you should bring your machine with you. You may wish to bring a light robe and slippers, a toothbrush and any other toiletries that will make you more comfortable.    The morning of the operation  Be sure not to eat or drink anything.  You will report at the designated time to the Surgery Waiting Area on the main entry floor of [facility], [address]. You should check in with the receptionist and then be seated. When told by the operating room staff, a [facility] employee will escort you to the 4th Floor to a gurney in the pre-op area. You will then get into a hospital gown and begin the preparations for the operation.    After the operation, also called Post-Op Day 0   You will spend one or two hours in the post-anesthesia care unit, then be transferred to a room on the surgical floor. If you have severe sleep apnea, asthma, or a lung condition, you might need to spend a short period  in the Intensive Care Unit.    Expect to be out of bed and walking. Your nurse will tell you when you are safe to walk by yourself. The first time you walk will be with your nurse.   Let your nurse know if you have pain that prevents you from moving. Oral medications will be used for pain management and works best when used regularly. Ask for another dose before the pain gets too bad, so that we can avoid chasing your pain.   It is common to feel sick to your stomach after surgery now that your stomach is so small. A good strategy to reduce this feeling is to take small, frequent sips of fluids. This will help to reduce the risk of dehydration as well. We can also give you medication, if needed, to reduce this feeling.    Post-Op Day 1   Expect to be tolerating small sips of oral fluids and walking often to reduce the risk of blood clots.   You will be started on an acid reduction medication to reduce your risk of developing ulcers in your digestive tract.   The dietitian will review the bariatric diet recommendations with you. This is a great time to have your diet specific questions answered.   Plan to be discharged from the hospital the day after surgery.    Post-Op Day 2   You will likely be home at this point; however, some patients may need to stay another night at the hospital.   At this point you should spend most of the day out of bed and walking. Have a family member help. You will continue a liquid diet as well as oral pain medication.    Prepare  for Discharge   You will be ready for discharge is you are meeting post-surgery goals: Walking on your own, tolerating liquids, and pain is well controlled   If you have a drain in place, this will be removed before you are discharged.   You will receive all medications needed at home through the [facility] to [facility]   You will be seen for post-operative follow-up through video visit with the bariatric clinic 1-3 weeks after surgery.      Post  Operation Instructions For Your Care at Home  It is very common to experience nausea and loose stools in the first month. These symptoms will go away with time.    The 24-hour contact number for the Surgery Faculty Practice is [phone].  Please call this number with any questions you might have. Call this number as soon as possible if any of the following symptoms occur:    1. Fever of greater than 101.67F  2. Nausea or vomiting (especially if you are unable to keep liquids down)  3. Severe pain at the incision  4. Increasing redness or foul-smelling drainage from the incision  5. Uncontrolled bleeding  6. Persistent diarrhea or more than 10 bowel movements in 24 hours  7. You are not able to urinate after 8 hours  8. If you experience dizziness, lightheadedness, or extreme fatigue    Medications & Pain Control  When you leave the hospital, you will be given prescriptions for several medications, some of which you will only take for a few months and others will be lifelong. These medications must be taken as prescribed and should be started within the first week of arriving home. You will be provided detailed instructions regarding the adjustment of chronic medications and addition of new medications.  Please contact our clinic with any questions.      You will be prescribed a medicine to help with pain control. Over time you will need less and less of the medicine, and you should gradually reduce the amount you take in the weeks after surgery. While Tylenol is ok, do not take any form of non-steroidal anti-inflammatory drugs (NSAIDS) such as the ones mentioned above!     Immediately following surgery and for at least the following 6 months, you will be prescribed a medicine that decreases acid production by your stomach in order to decrease the risk of ulcers.  This medication is a proton pump inhibitor (PPI), such as omeprazole (Prilosec) capsules  daily. Your surgeon may recommend that you continue  using an acid reduction medication for the rest of your life.    If you still have a gallbladder, you will be prescribed a medicine called Ursodiol to help prevent the formation of gallstones. The dose is  twice a day.  This should be taken for 6 months after surgery.    Vitamins and Minerals are Required Daily   Procare bariatric multivitamin once daily   Celebrate calcium + vitamin D chewable     While you will be able to pick up your first month supply from the [facility] at [facility], no prescription is needed.  We recommend ordering both online for subsequent refills as that is the most affordable options.  Go to Procarenow.com or Amazon.com for the multivitamin.  Go to celebratevitamins.com for the calcium chews.      Activity  It is common to feel a little more tired than usual in the first couple of weeks after surgery. This is to be  expected, and you should listen to your body to determine the appropriate level of activity.     You may return to full activity, including work, when you feel ready.  After a minimally invasive surgery this usually occurs within two weeks.   If you had minimally invasive surgery, you may want to wait 4-6 weeks before resume rigorous activity.    You may perform normal activities, such as walking up and down stairs, walking outside the house, doing chores about the house and riding in a car when you feel up to it.   You may drive after surgery as long as long as you are not taking any opioid pain medication.    Bowel Movements  You will likely have irregular bowel movements after bariatric surgery, but they will normalize over time.  If you have not had a bowel movement 4 days past your surgery, we recommend taking a dose of Milk of Magnesia. This will help for acute constipation by speeding up bowel motility.  To prevent constipation, we recommend adding powder fiber (Benefiber or Metamucil) with increased fluid intake, stool softner (Colace), smooth move tea, or small  doses of prune juice.     Wound Care  You may take showers after your operation. Let the soap and water run over your incisions. If you had a gastric bypass, expect some drainage where the drain was removed.  This is normal and will resolve after a few days.    Follow-up Contacts  Your first postoperative appointment should be scheduled within 1-3 weeks of leaving the hospital. If you don't have an appointment scheduled, MyChart your surgeon's office.    It is recommended that you meet regularly with a dietitian and join a support group in your community to assist in your lifestyle adjustments.     Post-Operative Follow-up Appointments  You will be seen by a member of our bariatric team via video visit.  If you wish to have a follow-up appointment with the dietitian, please let us know so the visit can be scheduled accordingly:   13 weeks after discharge   3 months post-op   6 months post-op   12 months post-op   every year thereafter    Monitoring lab values is important to avoid complications of malnutrition. Labs should start with the 3 month visit, and be drawn about 2 weeks before your appointment so the results can be discussed with your surgeon.  If you are getting your labs drawn at your local hospital, bring the printed results with you to your appointment.  These labs are recommended:   Complete blood count   Complete metabolic panel   Lipid Panel   Iron panel   Folate, thiamine, copper, iron, ferritin, selenium, zinc, vitamin A, D, B12   Hemoglobin A1C   PTH     Approach to Diet After Your Operation  The diet guidelines are designed to limit calories while providing a balanced meal plan to help prevent nutrient deficiencies and preserve muscle tissue. Tolerance and progression will vary greatly from patient to patient.    General Guidelines   Eat slowly and chew small bites of food thoroughly.   When you first start taking solids, avoid rice, bread, raw vegetables, and meats that are not  easily chewed such as pork and steak. Ground meats are usually better tolerated.   Eat balanced meals with small portions.   Keep a daily record of your food portions, plus calorie and protein intake.   Concentrate  on following a diet low in calories and sweets.   Avoid sugar, sugar-containing foods and beverages, concentrated sweets, and fruit juices.    Fluids  Drink extra water and low calorie or calorie-free fluids between meals to avoid dehydration. Sip about one cup of fluid between each small meal, 6 to 8 times a day. At least two liters (64 ounces or 8 cups) of fluid a day is recommended. You will gradually be able to meet this target.    We strongly warn against the use of any alcoholic beverages. Alcohol will get absorbed into your system much more quickly than before, and the sedative and mood- altering affects are more difficult to predict and control.    Protein  Preserve muscle tissue by eating foods rich in protein. High protein foods are eggs, meats, fish, seafood, tuna, poultry, soy milk, tofu, cottage cheese, yogurt, and other milk products. Your goal should be a minimum of 60-80 grams of protein per day. Do not worry if you cannot reach this goal in the first couple months after your operation.     Diet Progression After Bariatric Surgery  Please reference our "Diet after Bariatric Surgery" education guide for detailed directions regarding diet advancement after surgery.

## 2019-12-07 NOTE — Progress Notes (Signed)
Estimated body mass index is 62.65 kg/m as calculated from the following:    Height as of an earlier encounter on 12/07/19: 170.2 cm (5\' 7" ).    Weight as of an earlier encounter on 12/07/19: 181.4 kg (400 lb).    Consult Date: 12/07/2019  Surgeon: Dr. 12/09/2019    01/02/2020 -  ECG  Normal sinus rhythm  Normal ECG    01/02/2020 -  Pre Op Evaluation Labs:  CMP wnl   CBC wnl   Lipid panel wnl    PT/INR, PTT wnl   hemoglobin A1C 5.1   TSH, PTH  wnl   Iron, ferritin  wnl   Vitamin D 25(OH) 14, taking daily supplement now   H.pylori screening negative     01/04/2020 -  Evaluation by a mental health care practitioner for clearance for bariatric surgery   Cleared     01/12/2020 -  Evaluation by your Primary Care Provider and letter of recommendation/clearance for bariatric surgery   Cleared     12/21/2019 -  Evaluation by a nutritionist or dietician   Cleared     Alcohol, drug, and tobacco screening completed: Yes  quit 12/30/2019 on chantix now.  Drinks alcohol socially at birthdays or holidays.      Insurance: [facility] which requires 3 month   11/15/2019  Documented in: Dr. 11/17/2019     12/21/2019  Documented in: nutrition    01/16/2020  Documented in: pathways

## 2019-12-07 NOTE — Telephone Encounter (Signed)
Referral placed by covering provider

## 2019-12-07 NOTE — Telephone Encounter (Signed)
Duplicate message, referral already sent.

## 2019-12-08 NOTE — Telephone Encounter (Signed)
EKG has already been ordered by another provider

## 2019-12-13 NOTE — Telephone Encounter (Signed)
Behavioral Health Navigation Update    Returned pt's voice message requesting a call back requesting mental health services. Informed pt that Medi-cal plans do not cover mental health at  [facility]. Confirmed with pt that supportive resources sent by the [facility] Team via MyChart in 08/2019 had been received and reviewed. Pt said that they had an appt with one clinic but appt was canceled last minute and pt did not want to reschedule. Writer provided numbers for [employer] and [facility] and encouraged pt to call for assistance in linking to mental health services. Patient voiced understanding and will call. Instructed the pt to contact the [facility] ([place]) team should they encounter any barriers in linking to care.

## 2019-12-21 ENCOUNTER — Ambulatory Visit: Payer: MEDICAID | Attending: Registered" | Primary: Student in an Organized Health Care Education/Training Program

## 2019-12-21 DIAGNOSIS — Z6841 Body Mass Index (BMI) 40.0 and over, adult: Secondary | ICD-10-CM
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
## 2019-12-23 NOTE — Telephone Encounter (Signed)
Called patient to discuss symptoms. Since 4/24 having worsening vaginal bleeding, going through 5 pads in a day. Bright red blood with clots. Accompanied by constant mild cramping but with waves of severe cramping. Having pain despite appropriate ibuprofen dose. She is speaking in full sentences, without work of breathing and is not in acute distress.     She is out of town across the country and will return on the 8th. I told her someone from our office would call to schedule an in person appointment after the 8th. In the mean time if she were to develop heavier bleeding (going through a pad more than once an hour) or severe cramping causing N/V that she should come to the ED. I counseled her to continue the OCP as it may help with bleeding. Counseled that she can take 600mg  ibuprofen q6 with plenty of water.     Admin, please call to schedule the first available in person appointment on May 9th or later. Thank you!

## 2020-01-02 ENCOUNTER — Ambulatory Visit: Payer: MEDICAID | Primary: Student in an Organized Health Care Education/Training Program

## 2020-01-02 ENCOUNTER — Ambulatory Visit: Admit: 2020-01-02 | Payer: MEDICAID | Primary: Student in an Organized Health Care Education/Training Program

## 2020-01-02 DIAGNOSIS — Z6841 Body Mass Index (BMI) 40.0 and over, adult: Secondary | ICD-10-CM

## 2020-01-02 DIAGNOSIS — Z0000 Encounter for general adult medical examination without abnormal findings: Secondary | ICD-10-CM

## 2020-01-02 DIAGNOSIS — Z01810 Encounter for preprocedural cardiovascular examination: Secondary | ICD-10-CM

## 2020-01-02 LAB — CHOLESTEROL, LDL (INCL. TOT. A
Chol HDL Ratio: 4.5 (ref <6.0)
Cholesterol, HDL: 39 mg/dL — ABNORMAL LOW (ref >39)
Cholesterol, Total: 175 mg/dL (ref <200)
LDL Cholesterol: 124 mg/dL (ref <130)
Non HDL Cholesterol: 136 mg/dL (ref <160)
Triglycerides, serum: 61 mg/dL (ref <200)

## 2020-01-02 LAB — COMPREHENSIVE METABOLIC PANEL
AST: 16 U/L (ref 5–44)
Alanine transaminase: 18 U/L (ref 10–61)
Albumin, Serum / Plasma: 3.6 g/dL (ref 3.5–5.0)
Alkaline Phosphatase: 87 U/L (ref 38–108)
Anion Gap: 11 (ref 4–14)
Bilirubin, Total: 0.6 mg/dL (ref 0.2–1.2)
Calcium, total, Serum / Plasma: 9.1 mg/dL (ref 8.4–10.5)
Carbon Dioxide, Total: 22 mmol/L (ref 22–29)
Chloride, Serum / Plasma: 107 mmol/L (ref 101–110)
Creatinine: 0.96 mg/dL (ref 0.55–1.02)
Glucose, non-fasting: 129 mg/dL (ref 70–199)
Potassium, Serum / Plasma: 4 mmol/L (ref 3.5–5.0)
Protein, Total, Serum / Plasma: 7.6 g/dL (ref 6.3–8.6)
Sodium, Serum / Plasma: 140 mmol/L (ref 135–145)
Urea Nitrogen, Serum / Plasma: 17 mg/dL (ref 7–25)
eGFR - high estimate: 91 mL/min (ref >59)
eGFR - low estimate: 78 mL/min (ref >59)

## 2020-01-02 LAB — ECG 12-LEAD
Atrial Rate: 73 {beats}/min
Calculated P Axis: 43 degrees
Calculated R Axis: 49 degrees
Calculated T Axis: 37 degrees
P-R Interval: 148 ms
QRS Duration: 84 ms
QT Interval: 386 ms
QTcb: 425 ms
Ventricular Rate: 73 {beats}/min

## 2020-01-02 LAB — HEMOGLOBIN A1C: Hemoglobin A1c: 5.1 PERCENT (ref 4.3–5.6)

## 2020-01-02 LAB — COMPLETE BLOOD COUNT
Hematocrit: 37.1 % (ref 36.0–46.0)
Hemoglobin: 12.1 g/dL (ref 12.0–15.5)
MCH: 29.9 pg (ref 26.0–34.0)
MCHC: 32.6 g/dL (ref 31.0–36.0)
MCV: 92 fL (ref 80–100)
Platelet Count: 357 10*9/L (ref 140–450)
RBC Count: 4.05 10*12/L (ref 4.00–5.20)
WBC Count: 10.2 10*9/L — ABNORMAL HIGH (ref 3.4–10.0)

## 2020-01-02 LAB — FERRITIN: Ferritin: 82 ug/L (ref 12–160)

## 2020-01-02 LAB — THYROID STIMULATING HORMONE: Thyroid Stimulating Hormone: 1.15 mIU/L (ref 0.45–4.12)

## 2020-01-02 LAB — ACTIVATED PARTIAL THROMBOPLAST: Activated Partial Thromboplast: 26.2 s (ref 20.9–30.9)

## 2020-01-02 LAB — VITAMIN D, 25-HYDROXY: Vitamin D, 25-Hydroxy: 14 ng/mL — ABNORMAL LOW (ref 20–50)

## 2020-01-02 LAB — PROTHROMBIN TIME
Int'l Normaliz Ratio: 1 (ref 0.9–1.2)
PT: 13 s (ref 11.7–14.9)

## 2020-01-02 LAB — IRON, SERUM: Iron, serum: 60 ug/dL (ref 39–179)

## 2020-01-02 LAB — HIV ANTIBODY AND ANTIGEN COMBI: HIV Ag/Ab Combo: NEGATIVE

## 2020-01-02 LAB — ABO/RH: ABO/RH(D): O POS

## 2020-01-02 LAB — HEPATITIS C ANTIBODY WITH REFL: Hep C Ab, Qual: NEGATIVE

## 2020-01-02 MED ORDER — CHOLECALCIFEROL (VITAMIN D3) 25 MCG (1,000 UNIT) CAPSULE: 1,000 unit | ORAL | 0 refills | Status: DC

## 2020-01-02 NOTE — Result Quicknote (Signed)
Vitamin d low, otherwise labs wnl.    Dixon Boos, PA-C  [facility]   [phone]

## 2020-01-02 NOTE — Telephone Encounter (Signed)
Sent pt mychart msg. All labs normal expect Vit D 14 and WBC 10.2. Ordered vit D supplement to pt's pharmacy and shared dietary/behavior modification info to pt. Plan to repeat CBC and vit D in August.

## 2020-01-03 LAB — PARATHYROID HORMONE, INTACT: PTH: 63 ng/L (ref 18–90)

## 2020-01-03 MED ORDER — ESCITALOPRAM 5 MG TABLET: 5 mg | ORAL | 3 refills | Status: DC

## 2020-01-03 NOTE — Telephone Encounter (Signed)
Duplicate Request

## 2020-01-03 NOTE — Telephone Encounter (Signed)
Current pended order:  escitalopram oxalate (LEXAPRO) 5 mg tablet  Take 2 tablets (10 mg total) by mouth every morning    Last time approved:  escitalopram oxalate (LEXAPRO) 5 mg tablet  Take 2 tablets (10 mg total) by mouth every morning  Dispense: 30 tablet Refill: 3  [facility] [id number] 3785 2 by Michaell Cowing on 11/23/19      Last appointment: Visit date not found  Next appointment: 01/11/2020 Elsie Amis, MD    Last  BP:  BP Readings from Last 3 Encounters:   No data found for BP       Last Labs:    Lab Results   Component Value Date    Creatinine 0.96 01/02/2020    Sodium, Serum / Plasma 140 01/02/2020    Potassium, Serum / Plasma 4.0 01/02/2020    Hemoglobin A1c 5.1 01/02/2020    Cholesterol, Total 175 01/02/2020    LDL Cholesterol 124 01/02/2020    Cholesterol, HDL 39 (L) 01/02/2020    Thyroid Stimulating Hormone 1.15 01/02/2020    Hemoglobin 12.1 01/02/2020    eGFR - low estimate 78 01/02/2020    eGFR - high estimate 91 01/02/2020       If patient has not been seen in clinic for 12 months or more I have routed encounter to the administrative team to book a follow-up appointment.

## 2020-01-04 ENCOUNTER — Ambulatory Visit
Discharge: 2020-01-17 | Payer: MEDICAID | Attending: Forensic Psychiatry | Primary: Student in an Organized Health Care Education/Training Program

## 2020-01-04 DIAGNOSIS — E669 Obesity, unspecified: Secondary | ICD-10-CM

## 2020-01-04 DIAGNOSIS — F438 Other reactions to severe stress: Secondary | ICD-10-CM

## 2020-01-04 DIAGNOSIS — F429 Obsessive-compulsive disorder, unspecified: Secondary | ICD-10-CM

## 2020-01-04 DIAGNOSIS — Z7189 Other specified counseling: Secondary | ICD-10-CM

## 2020-01-04 DIAGNOSIS — Z008 Encounter for other general examination: Secondary | ICD-10-CM

## 2020-01-04 MED ORDER — ERGOCALCIFEROL (VITAMIN D2) 1,250 MCG (50,000 UNIT) CAPSULE
50000 | ORAL | 0 refills | Status: DC
Start: 2020-01-04 — End: 2020-05-23

## 2020-01-04 MED ORDER — ERGOCALCIFEROL (VITAMIN D2) 1,250 MCG (50,000 UNIT) CAPSULE
50000 | ORAL_CAPSULE | ORAL | 0 refills | Status: DC
Start: 2020-01-04 — End: 2020-01-04
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
## 2020-01-09 LAB — HELICOBACTER PYLORI IGG ANTIBO: H Pylori IgG Ab: <10 UNITS (ref <20.1)

## 2020-01-09 NOTE — Telephone Encounter (Signed)
Spoke with patient. She would like to avoid taking the ergo as it is a gel capsule and has pork derivative in it.  Will instead do vitamin D3 1,000IU twice daily for supplementation.    Dixon Boos, PA-C  [facility]   [phone]

## 2020-01-11 ENCOUNTER — Ambulatory Visit
Admit: 2020-01-11 | Payer: MEDICAID | Attending: Student in an Organized Health Care Education/Training Program | Primary: Student in an Organized Health Care Education/Training Program

## 2020-01-11 DIAGNOSIS — F17290 Nicotine dependence, other tobacco product, uncomplicated: Secondary | ICD-10-CM

## 2020-01-11 DIAGNOSIS — Z6841 Body Mass Index (BMI) 40.0 and over, adult: Secondary | ICD-10-CM

## 2020-01-11 DIAGNOSIS — N926 Irregular menstruation, unspecified: Secondary | ICD-10-CM

## 2020-01-11 DIAGNOSIS — E559 Vitamin D deficiency, unspecified: Secondary | ICD-10-CM

## 2020-01-11 DIAGNOSIS — F419 Anxiety disorder, unspecified: Secondary | ICD-10-CM

## 2020-01-11 DIAGNOSIS — Z720 Tobacco use: Secondary | ICD-10-CM

## 2020-01-11 DIAGNOSIS — Z0000 Encounter for general adult medical examination without abnormal findings: Secondary | ICD-10-CM

## 2020-01-11 MED ORDER — VARENICLINE 1 MG TABLET: 1 mg | ORAL | 2 refills | Status: DC

## 2020-01-11 MED ORDER — CHOLECALCIFEROL (VITAMIN D3) 25 MCG (1,000 UNIT) TABLET: 1000 UNITS | ORAL | 3 refills | Status: AC

## 2020-01-11 MED ORDER — VARENICLINE 0.5 MG TABLET: 0.5 mg | ORAL | 0 refills | Status: AC

## 2020-01-11 MED ORDER — ESCITALOPRAM 20 MG TABLET: 20 mg | ORAL | 3 refills | Status: DC

## 2020-01-11 NOTE — Patient Instructions (Addendum)
Dear Kayla Frey,     It was good to see you in clinic today. This document is your After Visit Summary, which includes what we discussed in clinic today, what the next steps are, and a list of your medications.     Things you should do after you leave clinic today:    For your weight management:    - I will touch base with Dr. Montez Morita to see what they need from me (eg letter of recommendation or anything else before your procedure)    For your mood symptoms    - Please INCREASE your lexapro from 10mg  to 20mg  once daily    For your tobacco cessation    - Please START chantix 0.5mg  once daily for day 1-day 3 then 0.5mg  twice a day from day 4 to day 7 and then 1mg  twice a day for 11 weeks (for a total of 12 weeks of treatment)    - I have included additional tobacco cessation information below    - I have also placed a referral to our [facility] cessation clinic who should reach out to you within the next 1-2 weeks for additional assistance     For your vaginal bleeding:    - I have placed a referral for a pelvic ultrasound. To schedule your exam, please call [facility] Scheduling [phone].    - Please continue to use condoms with sexual intercourse while you are off oral contraception medication     For your vitamin D deficiency     - I have ordered vitamin D tablets instead of capsules to your pharmacy. If they are not covered by insurance please purchase them over the counter    - We will re-check your vitamin D levels in August to ensure they are improving        Deciding About Using Medicines To Quit Smoking  How can you decide about using medicines to quit smoking?  What are the medicines you can use?  Your doctor may prescribe varenicline (Chantix) or bupropion (Zyban). These medicines can help you cope with cravings for tobacco. They are pills that don't contain nicotine.  You also can use nicotine replacement products. These do contain nicotine. There are many types.   Gum and lozenges slowly  release nicotine into your mouth.   Patches stick to your skin. They slowly release nicotine into your bloodstream.   An inhaler has a holder that contains nicotine. You breathe in a puff of nicotine vapor through your mouth and throat.   Nasal spray releases a mist that contains nicotine.  What are key points about this decision?   Using medicines can double your chances of quitting smoking. They can ease cravings and withdrawal symptoms.   Getting counseling along with using medicine can raise your chances of quitting even more.   If you smoke fewer than 5 cigarettes a day, you may not need medicines to help you quit smoking.   These medicines have less nicotine than cigarettes. And by itself, nicotine is not nearly as harmful as smoking. The tars, carbon monoxide, and other toxic chemicals in tobacco cause the harmful effects.   The side effects of nicotine replacement products depend on the type of product. For example, a patch can make your skin red and itchy. Medicines in pill form can make you sick to your stomach. They can also cause dry mouth and trouble sleeping. For most people, the side effects are not bad enough to make  them stop using the products.  Why might you choose to use medicines to quit smoking?   You have tried on your own to stop smoking, but you were not able to stop.   You smoke more than 5 cigarettes a day.   You want to increase your chances of quitting smoking.   You want to reduce your cravings and withdrawal symptoms.   You feel the benefits of medicine outweigh the side effects.  Why might you choose not to use medicine?   You want to try quitting on your own by stopping all at once ("cold [place]").   You want to cut back slowly on the number of cigarettes you smoke.   You smoke fewer than 5 cigarettes a day.   You do not like using medicine.   You feel the side effects of medicines outweigh the benefits.   You are worried about the cost of medicines.  Your  decision  Thinking about the facts and your feelings can help you make a decision that is right for you. Be sure you understand the benefits and risks of your options, and think about what else you need to do before you make the decision.  Where can you learn more?  Go to http://blackburn.com/  Enter 5018566059 in the search box to learn more about "Deciding About Using Medicines To Quit Smoking."  Current as of: March 12, 2020Content Version: 12.8   2006-2021 Healthwise, Incorporated.   Care instructions adapted under license by your healthcare professional. If you have questions about a medical condition or this instruction, always ask your healthcare professional. Ruby any warranty or liability for your use of this information.                    It was a pleasure seeing you today. Please don't hesitate to reach out if you have any additional questions or concerns.    Best,    Kary Kos, MD PGY-2    Stopping Smoking: Care Instructions    Your Care Instructions  Cigarette smokers crave the nicotine in cigarettes. Giving it up is much harder than simply changing a habit. Your body has to stop craving the nicotine. It is hard to quit, but you can do it. There are many tools that people use to quit smoking. You may find that combining tools works best for you.  There are several steps to quitting. First you get ready to quit. Then you get support to help you. After that, you learn new skills and behaviors to become a nonsmoker. For many people, a necessary step is getting and using medicine.  Your doctor will help you set up the plan that best meets your needs. You may want to attend a smoking cessation program to help you quit smoking. When you choose a program, look for one that has proven success. Ask your doctor for ideas. You will greatly increase your chances of success if you take medicine as well as get counseling or join a cessation program.  Some  of the changes you feel when you first quit tobacco are uncomfortable. Your body will miss the nicotine at first, and you may feel short-tempered and grumpy. You may have trouble sleeping or concentrating. Medicine can help you deal with these symptoms. You may struggle with changing your smoking habits and rituals. The last step is the tricky one: Be prepared for the smoking urge to continue for a time. This is a lot to deal  with, but keep at it. You will feel better.  Follow-up care is a key part of your treatment and safety. Be sure to make and go to all appointments, and call your doctor if you are having problems. Its also a good idea to know your test results and keep a list of the medicines you take.  How can you care for yourself at home?   Ask your family, friends, and coworkers for support. You have a better chance of quitting if you have help and support.   Join a support group, such as Nicotine Anonymous, for people who are trying to quit smoking.   Consider signing up for a smoking cessation program, such as the American Lung Association's Freedom from Smoking program.   Set a quit date. Pick your date carefully so that it is not right in the middle of a big deadline or stressful time. Once you quit, do not even take a puff. Get rid of all ashtrays and lighters after your last cigarette. Clean your house and your clothes so that they do not smell of smoke.   Learn how to be a nonsmoker. Think about ways you can avoid those things that make you reach for a cigarette.   Avoid situations that put you at greatest risk for smoking. For some people, it is hard to have a drink with friends without smoking. For others, they might skip a coffee break with coworkers who smoke.   Change your daily routine. Take a different route to work or eat a meal in a different place.   Cut down on stress. Calm yourself or release tension by doing an activity you enjoy, such as reading a book, taking a hot bath, or  gardening.   Talk to your doctor or pharmacist about nicotine replacement therapy, which replaces the nicotine in your body. You still get nicotine but you do not use tobacco. Nicotine replacement products help you slowly reduce the amount of nicotine you need. These products come in several forms, many of them available over-the-counter:   Nicotine patches   Nicotine gum and lozenges   Nicotine inhaler   Ask your doctor about bupropion (Wellbutrin) or varenicline (Chantix), which are prescription medicines. They do not contain nicotine. They help you by reducing withdrawal symptoms, such as stress and anxiety.   Some people find hypnosis, acupuncture, and massage helpful for ending the smoking habit.   Eat a healthy diet and get regular exercise. Having healthy habits will help your body move past its craving for nicotine.   Be prepared to keep trying. Most people are not successful the first few times they try to quit. Do not get mad at yourself if you smoke again. Make a list of things you learned and think about when you want to try again, such as next week, next month, or next year.  Where can you learn more?  Go to [employer]ruitS[employer].ca.  Enter [phone] in the search box to learn more about "Stopping Smoking: Care Instructions."  Current as of: Jan 18, 2015  Content Version: 11.1   2006-2016 Healthwise, Incorporated. Care instructions adapted under license by your healthcare professional. If you have questions about a medical condition or this instruction, always ask your healthcare professional. Healthwise, Incorporated disclaims any warranty or liability for your use of this information.    Deciding About Using Medicines To Quit Smoking    What are the medicines you can use?  Your doctor may prescribe varenicline (Chantix) or bupropion (Zyban). These  medicines can help you cope with cravings for tobacco. They are pills that don't contain nicotine.  You also can use nicotine replacement  products. These do contain nicotine. There are many types.   Gum and lozenges slowly release nicotine into your mouth.   Patches stick to your skin. They slowly release nicotine into your bloodstream.   An inhaler has a holder that contains nicotine. You breathe in a puff of nicotine vapor through your mouth and throat.   Nasal spray releases a mist that contains nicotine.  What are key points about this decision?   Using medicines can double your chances of quitting smoking. They can ease cravings and withdrawal symptoms.   Getting counseling along with using medicine can raise your chances of quitting even more.   If you smoke fewer than 5 cigarettes a day, you may not need medicines to help you quit smoking.   These medicines have less nicotine than cigarettes. And by itself, nicotine is not nearly as harmful as smoking. The tars, carbon monoxide, and other toxic chemicals in tobacco cause the harmful effects.   The side effects of nicotine replacement products depend on the type of product. For example, a patch can make your skin red and itchy. Medicines in pill form can make you sick to your stomach. They can also cause dry mouth and trouble sleeping. For most people, the side effects are not bad enough to make them stop using the products.   FDA warning. The U.S. Food and Drug Administration (FDA) warns that people who are taking bupropion or varenicline and who have any serious or unusual changes in mood or behavior or who feel like hurting themselves or someone else should stop taking the medicine and call a doctor right away. If you already have a mood or behavior problem, be sure to tell your doctor before you decide to use these medicines.  Why might you choose to use medicines to quit smoking?   You have tried on your own to stop smoking, but you were not able to stop.   You smoke more than 5 cigarettes a day.   You want to increase your chances of quitting smoking.   You want to reduce  your cravings and withdrawal symptoms.   You feel the benefits of medicine outweigh the side effects.  Why might you choose not to use medicine?   You want to try quitting on your own by stopping all at once ("cold [place]").   You want to cut back slowly on the number of cigarettes you smoke.   You smoke fewer than 5 cigarettes a day.   You do not like using medicine.   You feel the side effects of medicines outweigh the benefits.   You are worried about the cost of medicines.  Your decision  Thinking about the facts and your feelings can help you make a decision that is right for you. Be sure you understand the benefits and risks of your options, and think about what else you need to do before you make the decision.  Where can you learn more?  Go to RecruitS[employer].ca.  Enter [phone] in the search box to learn more about "Deciding About Using Medicines To Quit Smoking."  Current as of: Jan 18, 2015  Content Version: 11.1   2006-2016 Healthwise, Incorporated. Care instructions adapted under license by your healthcare professional. If you have questions about a medical condition or this instruction, always ask your healthcare professional. [employer] disclaims any  warranty or liability for your use of this information.
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
## 2020-01-11 NOTE — Telephone Encounter (Signed)
Dear Madlyn Frankel,    Can you please schedule [name] for an in-person PAP with the next available 2nd floor Harriet Butte MD or NP for routine cervical cancer screening and then add her to the recall list to make an appt w me once schedules open up for VV in July or August to discuss her weight, tobacco use and mood sxs.     Thanks!

## 2020-01-12 NOTE — Telephone Encounter (Signed)
Sent Dixon Boos, PA for bariatric surgery letter of recommendation for Kayla Frey to proceed with bariatric procedure. Let pt know letter sent w mychart msg

## 2020-01-13 MED ORDER — CLOTRIMAZOLE 1 % TOPICAL CREAM: 1 % | TOPICAL | 1 refills | Status: AC

## 2020-01-13 NOTE — Telephone Encounter (Signed)
[facility])    Patient referred to [facility] for smoking cessation counseling and support. Wants a call back. Will follow-up and call patient again.

## 2020-01-13 NOTE — Telephone Encounter (Signed)
Pt messaged about heat rash under breasts. Prescribed clotrimazole and sent pt msg with additional behavioral modification recs:     ?Daily cleansing of intertriginous skin with a mild cleanser followed by drying of affected area with a hair dryer on a cool setting    ?Aeration of affected area when feasible    ?Daily application of drying powders, such as powders composed of microporous cellulose    ?Use of absorbent material or clothing, such as cotton or merino wool, to separate skin in folds    ?Weight loss    ?Zinc oxide, petrolatum, or dimethicone topicals can be applied after cleaning the intertriginous area with mild soap and water and drying with a hair dryer on a cold setting.    ?Clotrimazole cream once to twice daily application for 2-4 weeks given common presence of candidal infection

## 2020-01-13 NOTE — Telephone Encounter (Signed)
Sent scheduling ticket.

## 2020-01-16 ENCOUNTER — Ambulatory Visit
Payer: MEDICAID | Attending: Physician Assistant | Primary: Student in an Organized Health Care Education/Training Program

## 2020-01-16 DIAGNOSIS — Z6841 Body Mass Index (BMI) 40.0 and over, adult: Secondary | ICD-10-CM

## 2020-01-16 NOTE — Progress Notes (Signed)
I performed this consultation using real-time Telehealth tools, including a live video connection between my location and the patient's location. Prior to initiating the consultation, I obtained informed verbal consent to perform this consultation using Telehealth tools and answered all the questions about the Telehealth interaction.    HPI  I saw Kayla Frey, DOB 1985-09-03, today at the [facility], Bariatric Surgery Program: [facility].  She is here for documentation of non surgical weight loss attempts to prepare for bariatric surgery.  PWC # 3 of 3.    She has been eating deli meat with cheese, beans, [place], chicken, protein drinks.  She was previously skipping breakfast (was drinking just coffee), now trying to eating something for breakfast, ie. protein shake. Switching from chips or plantain chips or carrots. She cut out energy drinks.      She has been walking x 3 days a week. Also traveling more to see family, which has increased her physical activity.      Pre-op labs showed vitamin D deficiency.  She is now talking a daily supplement for treatment.  She has completed her nutrition and psych visits. She quit smoking 12/30/2019; taking chantix.       PE  Vitals: Ht 170.2 cm (5\' 7" )   Wt (!) 186.4 kg (411 lb)   BMI 64.37 kg/m    Constitutional: oriented to person, place, and time.    Assessment & Plan  1. Class 3 severe obesity with body mass index (BMI) of 60.0 to 69.9 in adult, unspecified obesity type, unspecified whether serious comorbidity present (CMS code)  -She has completed she supervised weight loss visits.  I will see her next month for a pre-op visit. She will have her bariatric pharmacy visit the same day.  -She will continue with smoking cessation.  She is very motivated to move forward with bariatric surgery.  -She will continue to increase protein intake and decrease carbohydrate and sugar intake.   -EMMI videos assigned for  sleeve gastrectomy and anesthesia.    -Emailed her both education guides, "What to Expect: Bariatric Surgery" and "Diet after Bariatric Surgery."    I spent a total of 30 minutes on this patient's care on the day of their visit excluding time spent related to any billed procedures. This time includes time spent with the patient as well as time spent documenting in the medical record, reviewing patient's records and tests, obtaining history, placing orders, communicating with other healthcare professionals, counseling the patient, family, or caregiver, and/or care coordination for the diagnoses above.      This is an independent service.  The available consultant for this service is , MD.           Kayla Duff, PA-C  [facility]: Bariatric Surgery         [facility]  [address]nd Floor  [place], [place]  Ph: [phone] (OPT-5)  Direct Line: [phone]  Fax: [phone]

## 2020-01-17 ENCOUNTER — Ambulatory Visit
Admit: 2020-01-17 | Payer: MEDICAID | Attending: Physician | Primary: Student in an Organized Health Care Education/Training Program

## 2020-01-17 DIAGNOSIS — Z6841 Body Mass Index (BMI) 40.0 and over, adult: Secondary | ICD-10-CM

## 2020-01-17 DIAGNOSIS — Z720 Tobacco use: Secondary | ICD-10-CM

## 2020-01-17 DIAGNOSIS — E559 Vitamin D deficiency, unspecified: Secondary | ICD-10-CM

## 2020-01-17 NOTE — Patient Instructions (Signed)
Your weight today is:  Your BMI is Body mass index is 64.37 kg/m.  Your last 3 weights have been   Wt Readings from Last 3 Encounters:   01/17/20 (!) 186.4 kg (411 lb)   01/16/20 (!) 186.4 kg (411 lb)   12/20/19 (!) 181.4 kg (400 lb)       You have lost 12 lbs from your max weight.   Congratulations!    Continue with Doctor Visits (Dr. Chelse Matas Piper), nutritionist visits,Thursday evening classes.  If serious issues or concerns, behavior change counseling with therapist (Dr. Norlene Campbell) if insurance covers it or you can self-pay.     - To leave a message for your providers: [phone].      - To make or cancel appt: [phone]     Goals:  -  Continue to track your intake. Be as detailed as you can with the type and quantity. Include everything you eat and drink at the time you are eating.  Do this on paper or on-line or with an app (Myfitnesspal, LoseIt, SparkPeople).       - Bring your food diary to your visits with the nutritionist    - Attend the Thursday night classes.    - Return monthly to see Dr Raniya Golembeski Piper    - NEW GOAL(S): What is one thing you would like to change in your eating habits today?  -- Increase your protein intake, eggs, yogurt, low fat string cheese, fish, chicken, tofu.  -- Continue to eat breakfast within 30 to 60 minutes of waking.  -- Track what you eat and drink on LoseIt or MyFitnessPal.  -- Increase movement by incorporating squats, stretches and kettle bells.

## 2020-01-17 NOTE — Progress Notes (Signed)
Kayla Frey is a 34 y.o. female  adult  here for follow up in the weight management clinic.    Video Visit      Pt entered our program on: 11/15/19  Starting weight was (!) 181.4 kg (400 lb)  Max weight 423 lb  Today's weight is  (!) 186.4 kg (411 lb)    Weight change, got up to 423 and now down to 411.    Updates on medical, diet and exercise plan or changes since last visit:   Prepping and preparing for weight loss surgery.     Has follow up appt with nutrition.     Not currently tracking.      Has eliminated much of the unhealthy foods from her diet.      Stopped drinking Monster drinks and caffeine.      Has quit smoking x 19 days!    Started taking Vitamin D supplement.    Review of Systems:   Const: no fatigue   Psych: no anxiety or depression     Physical Examination:   Wt (!) 186.4 kg (411 lb) Comment: home measurement  BMI 64.37 kg/m  Body mass index is 64.37 kg/m.  Gen: Well appearing, NAD, well dressed.   Eyes: No scleral icterus, normal conjunctivae.   Psych: Normal affect, good eye contact, engaged.     Labs reviewed by me today:  Component      Latest Ref Rng & Units 01/02/2020   Albumin, Serum / Plasma      3.5 - 5.0 g/dL 3.6   Alkaline Phosphatase      38 - 108 U/L 87   ALT      10 - 61 U/L 18   AST      5 - 44 U/L 16   Bilirubin, Total      0.2 - 1.2 mg/dL 0.6   Urea Nitrogen, Serum / Plasma      7 - 25 mg/dL 17   Calcium      8.4 - 10.5 mg/dL 9.1   Chloride      101 - 110 mmol/L 107   Creatinine      0.55 - 1.02 mg/dL 0.96   eGFR - low estimate      >59 mL/min 78   eGFR - high estimate      >59 mL/min 91   Potassium      3.5 - 5.0 mmol/L 4.0   Sodium      135 - 145 mmol/L 140   Protein, Total, Serum / Plasma      6.3 - 8.6 g/dL 7.6   CO2      22 - 29 mmol/L 22   Anion Gap      4 - 14 11   Glucose      70 - 199 mg/dL 129   WBC Count      3.4 - 10.0 x10E9/L 10.2 (H)   RBC Count      4.00 - 5.20 x10E12/L 4.05   Hemoglobin      12.0 - 15.5 g/dL 12.1   Hematocrit      36.0 - 46.0 % 37.1   MCV      80  - 100 fL 92   MCH      26.0 - 34.0 pg 29.9   MCHC      31.0 - 36.0 g/dL 32.6   Platelet Count      140 - 450 x10E9/L 357   Cholesterol, Total      <  200 mg/dL 175   Triglycerides, serum      <200 mg/dL 61   Cholesterol, HDL      >39 mg/dL 39 (L)   LDL Cholesterol      <130 mg/dL 124   Chol HDL Ratio      <6.0 4.5   Non HDL Cholesterol      <160 mg/dL 136   PT      11.7 - 14.9 s 13.0   Int'l Normaliz Ratio      0.9 - 1.2 1.0   Vitamin D, 25-Hydroxy      20 - 50 ng/mL 14 (L)   Helicobacter pylori IgG Antibody      <20.1 UNITS <10.0   Iron, serum      39 - 179 ug/dL 60   Ferritin      12 - 160 ug/L 82   Thyroid Stimulating Hormone      0.45 - 4.12 mIU/L 1.15   Activated Partial Thromboplastin Time      20.9 - 30.9 s 26.2   HCV Antibody      NEG NEG   HIV Ag/Ab, 4th Gen      NEG NEG   Hemoglobin A1c      4.3 - 5.6 PERCENT 5.1   PTH      18 - 90 ng/L 63       Assessment/Plan:   Follow up patient whose longterm goal to: Healthier lifestyles, less body aches, live longer.  Improve knee, back and hip pain.      Obesity Class 3  Body mass index is 64.37 kg/m.    Weight change:  got up to 423 and now down to 411.    Patient's goals from last visit with me on 11/13/19 were:  -- Referral placed to surgery clinic to discuss bariatric surgery and begin insurance approval process.    --Info on the [facility]  Contact: Email [email or web address]     Patient set the following goals today:  -- Increase your protein intake, eggs, yogurt, low fat string cheese, fish, chicken, tofu.  -- Continue to eat breakfast within 30 to 60 minutes of waking.  -- Track what you eat and drink on LoseIt or MyFitnessPal.  -- Increase movement by incorporating squats, stretches and kettle bells.    I reviewed all her pre op labs today including Vit D that is low, A1c, liver function tests, cholesterol and others.       Other medical problems contributing to obesity include:  Depression and Anxiety  Engaged in care.   Did not tolerate Zoloft.  Tolerating Lexapro.    Irregular menses likely PCOS  On OCP's to regulate menses.  OCP's not contributing to weight gain.     Tobacco use  Pt quit smoking.  None x 19 days. Congratulated her.    +Snoring  Consider OSA evaluation.    Vit D def  New problem.  Pt taking supplementation.  Will recheck prior to weight loss surgery.    Return in about 4 weeks (around 02/14/2020) for Ticket, VV, weight management.    I spent a total of 26 minutes on this patient's care on the day of their visit excluding time spent related to any billed procedures. This time includes face-to-face time with the patient as well as time spent documenting in the medical record, reviewing patient's records and tests, obtaining history, placing orders, communicating with other healthcare professionals, counseling the patient, family, or caregiver, and/or care coordination  for the diagnoses above.    Will send note to primary care M.D.

## 2020-01-18 NOTE — Telephone Encounter (Signed)
[facility])     Patient referred to [facility] for tobacco treatment and support.     Interest in Stopping Smoking:  Scale 0-10, 10 being the highest.  Importance of stopping smoking right now: 10  Confidence that you can stop for good now: 7    Smoking History:  Age at which you first smoked: 27  Number of years smoking: 6    Number of cigarettes smoked per day:10 (last smoked 2 weeks ago)  How many minutes after you awaken do you first smoke? 5 min, with coffee    Quit Attempts  Longest duration of time that you have been smoke free: 2 months after molar extractions    Have you ever used NRT or NNRT?  Patch - severe erythema and pruritis, not unusual to have skin reactions.     Offered counseling resources:   1. [place] Smokers Hotline   2. Individual counseling with physician specialist   3. [facility] 4 week Stop Smoking/Vaping/Chewing class    Discussed:    - Nicotine withdrawal symptoms   - The need for flexibility when trying to stop smoking   - How to use Chantix   - How to use lozenge   - The benefits of counseling as part of tobacco treatment   - Planning change instead of relying solely on "willpower"    Plan:    - Start Chantix.   - Partner smokes but will remove all tobacco related materials such as ashtrays and lighter from view and smoke outside the home.   - Use delay and distraction activities like reading, cleaning, painting.    - Continue to spend more time with friends who don't smoke or who have stopped smoking.     Follow-Up:   - Patient has [facility] contact information, encouraged to call for support.   - Wants Individual counseling; Schedule with Dr. Dagoberto Reef 02/01/20 10:45.       Stefanie Libel, PA-C  Cell: [phone]    [facility]  --offering individual and group counseling on Zoom.

## 2020-01-18 NOTE — Telephone Encounter (Signed)
[facility])    Called patient to "check in" on how smoking cessation efforts are progressing and to see any additional information or help with problem solving is needed.     Doing well.   Smoke-free 21 days. No cravings.   Day 5 Chantix. No nausea, no sleep disturbance.   Excited to be smoke-free. Planning for bariatric surgery in August.     Appointment with Dr. Dagoberto Reef 02/01/20 10:45 AM  [employer] survey emailed.  [facility] resource emailed.

## 2020-01-19 NOTE — Telephone Encounter (Signed)
Reminded pt to call and schedule transvaginal US to evaluate for PCOS

## 2020-02-08 ENCOUNTER — Ambulatory Visit
Payer: MEDICAID | Attending: Physician Assistant | Primary: Student in an Organized Health Care Education/Training Program

## 2020-02-08 ENCOUNTER — Ambulatory Visit: Payer: MEDICAID | Attending: Pharmacist | Primary: Student in an Organized Health Care Education/Training Program

## 2020-02-08 DIAGNOSIS — Z6841 Body Mass Index (BMI) 40.0 and over, adult: Secondary | ICD-10-CM

## 2020-02-08 MED ORDER — DOCUSATE SODIUM 250 MG CAPSULE
250 | ORAL | Status: DC
Start: 2020-02-08 — End: 2020-05-24

## 2020-02-08 MED ORDER — BIOTIN 10,000 MCG DISINTEGRATING TABLET
10000 | ORAL | Status: AC
Start: 2020-02-08 — End: ?

## 2020-02-08 MED ORDER — PROBIOTIC PEARLS COMPLETE ORAL
ORAL | Status: DC
Start: 2020-02-08 — End: 2020-09-17

## 2020-02-08 MED ORDER — UNABLE TO FIND
0.00 refills | 30.00000 days | Status: DC
Start: 2020-02-08 — End: 2020-05-10

## 2020-02-08 NOTE — Patient Instructions (Signed)
Preparing for Surgery at [facility]                                Your surgeon has recommended that you have an operation.  The following instructions are designed to take you step-by-step through the process and to answer some frequently asked questions.    When am I having surgery and where do I go?  Your surgeon's office will provide you with the location, date, and time for your operation.  If you do not hear from your surgeon's office and want to check on the status of your upcoming procedure, please call the surgeon's office and ask to speak with his/her practice assistant.     Will I meet my anesthesiologist before surgery?  You will not meet the anesthesiologist assigned to take care of you until the day of surgery.  However, you will be assessed by one of [facility]'s nurse practitioners prior to your operation.  For some patients, this assessment will take place over the phone.  For other patients, an in-person evaluation will be required in our PREPARE clinic.      What do I need to do now?    You will receive a call from PREPARE 7-10 days before your surgery to set up either a phone consult or in-person PREPARE clinic appointment.  If you do not receive a call from the PREPARE clinic, please call your surgeon's office to confirm that your operation has been scheduled and the referral to PREPARE made.      Do I need to have any tests done prior to surgery?  Your surgeon may require laboratory or other diagnostics tests prior to surgery.  These are printed in your After Visit Summary from your surgery clinic visit, and your surgeon may have also given you printed requisition forms.    If you are seen in-person in the PREPARE Clinic, your tests will be performed during your PREPARE appointment.    If you are evaluated by phone, a member of the PREPARE team will give you explicit instructions on when and where to have these studies done.      For many operations, laboratory tests, EKGs  and chest x-rays are not needed.      If you are a Jehovah's Witness, you must notify your surgeon in advance of your procedure. In addition, you MUST discuss your desires regarding transfusion of blood products with your surgeon or the Nurse Practitioner from the PREPARE Clinic.  If you have a bleeding disorder such as von Willebrand's disease or hemophilia, notify your surgeon in advance as special arrangements may need to be made in preparation for your surgery (for example, referral to a hematologist).    Will [facility] review my health information from other places?  In order to plan for a smooth operation and recovery, we frequently need to review records from your other doctors.  If you have had any of the studies listed below, please arrange to have the reports faxed to the PREPARE clinic.  Delays in obtaining these records may lead to a delay in your upcoming procedure.    Please Fax the following records to PREPARE at [phone]:  (If any of the following were done at [facility], you do not need to provide these records)  1.  Recent notes from your primary care provider  2.  Recent blood work (within 6 months)  3.  Stress test  4.    Echocardiogram (Echo)   5.  Electrocardiogram (EKG)  6   Cardiac catheterization   7   Pacemaker or ICD (Implantable cardioverter-defibrillator)   8.  Clinic notes from any specialist who has evaluated you in the past 2 years (for example a cardiologist, pulmonologist, hematologist)    Which medications should I stop or start prior to surgery?  Our PREPARE nurse practitioner will give you detailed instructions on how to manage your medications prior to surgery.     Where do I go on the day of surgery?  And when?  PREPARE will give you instructions on where to go on the day of surgery.  Your surgeon's office will tell you when to arrive on the day of surgery.      Where is the PREPARE clinic?  There are two PREPARE clinics, one at [place] and one at [place].  If you are coming in  for an in-person appointment, PREPARE will tell you which clinic to go to.  Directions are below:    Directions to [facility] PREPARE  The [place] PREPARE clinic is located on the first floor of [facility], [address], [address], in [place].  Public parking at [facility] is available in the [place] [address] at [address]   [facility] is accessible via [employer] streetcar line [address], which stops at [address] and [address], and the following [employer] bus lines, which stop in front of the hospital: [address], [address], [address]    Directions to [place] PREPARE  The [place] [address] PREPARE clinic is located on the third floor of the [facility], [address], Reception [address] in [place]. Public parking for [facility] [place] [place] is available in the parking garage at [address].  For public transportation the [facility] is served by the [employer] [address] line, which stops at the [facility] [place] [facility] located on [address] near [address]. A new 55 [employer] line will provide service from the [address] BART station to the [place] hospitals.  For more information on Parking and transportation please call [phone]    Does my surgeon have any special instructions for me?  If your surgeon has specific instructions, they are listed below.

## 2020-02-08 NOTE — Progress Notes (Signed)
HPI  I saw Kayla Frey today in the Bariatric Surgery Center at the [place], [place].  As you know, she is in the process of being evaluated for bariatric surgery.      She presents today after having completed the necessary required preoperative tests and consultations.  Specifically, the results of the completed tests and consultations include the following:    She stopped chantix, has been 41 days without cigarettes; May 7th was her quit date.      Consult Date: 12/07/2019  Surgeon: Dr. Eulas Post    01/02/2020 -  ECG  Normal sinus rhythm  Normal ECG    01/02/2020 -  Pre Op Evaluation Labs:  CMP wnl   CBC wnl   Lipid panel wnl    PT/INR, PTT wnl   hemoglobin A1C 5.1   TSH, PTH  wnl   Iron, ferritin  wnl   Vitamin D 25(OH) 14, taking daily supplement now   H.pylori screening negative     01/04/2020 -  Evaluation by a mental health care practitioner for clearance for bariatric surgery   Cleared     01/12/2020 -  Evaluation by your Primary Care Provider and letter of recommendation/clearance for bariatric surgery   Cleared     12/21/2019 -  Evaluation by a nutritionist or dietician   Cleared     Alcohol, drug, and tobacco screening completed: Yes  quit 12/30/2019 on chantix now.  Drinks alcohol socially at birthdays or holidays.      Insurance: [facility] which requires 3 month   11/15/2019  Documented in: Dr. Luvenia Heller    12/21/2019  Documented in: nutrition    01/16/2020  Documented in: pathways           PE  Vitals:    02/08/20 1207   Weight: (!) 193.2 kg (426 lb)   Height: 170.2 cm (5\' 7" )     Height: 5\' 7"   Weight: (!) 193.2 kg (426 lb)   Constitutional:       Appearance: Patient is well-developed. Patient is not diaphoretic.   Pulmonary:      Effort: Pulmonary effort is normal. No respiratory distress.   Abdominal:      General: She is obese   Skin:     Coloration: Skin is not pale.   Neurological:      Mental Status:She is alert and oriented to person, place, and time.   Psychiatric:          Behavior: Behavior normal.         Thought Content: Thought content normal.         Judgment: Judgment normal.      Assessment and Plan  In summary, Kayla Frey is a 34 year-old female with severe obesity (BMI 66.72) and associated comorbidities.She presents for consideration of bariatric surgery.  She does satisfy the criteria set forth for bariatric surgery by the [facility].      The scheduling process will occur over the next few days and the date for surgery will likely be within the next 4-6 weeks, depending on insurance authorization. Two weeks before surgery, the patient will be placed on a very low-calorie liquid diet. We have given written and oral instructions how to do this.     Today I have again extensively discussed the technical aspects of bariatric surgery with the patient as well as the expected recovery. I have also again discussed the risks, benefits, and alternatives of these procedures.  The patient  understands that surgery is only a tool to help with weight loss, and that she will need to maintain a carefully organized and attentive nutritional program with the help of a dietician both before and after surgery.  She understands these plans, the technical aspects of surgery and risks involved, including those associated with both anesthesia and surgery.  She wishes to proceed with scheduling and surgery.       After thinking about options for surgery, she has decided to proceed with a laparoscopic sleeve gastrectomy.  The patient has asked appropriate questions, all of which have been answered today.   Prior tosurgery she will be evaluated by the [facility].     If you have any questions,please feel free to contact me at your convenience at the [facility] Center.  I appreciate the opportunity to participate in the care of your patient.    Documentation of Visit Statement  I spent a total of 25 minutes in face-to-face time with the patient and  in non-face-to-face activities conducted today, 02/08/20, directly related to this video visit, including reviewing records and tests, obtaining history and exam, placing orders, communicating with other healthcare professionals, counseling the patient, family, or caregiver, documenting in the medical record, and/or care coordination for the diagnoses above.    I performed this consultation using real-time Telehealth tools, including a live video connection between my location and the patient's location. Prior to initiating the consultation, I obtained informed verbal consent to perform this consultation using Telehealth tools and answered all the questions about the Telehealth interaction.    This is an independent service.  The available consultant for this service is Georgia Duff, MD.       Dixon Boos, PA-C  [facility]   [phone]

## 2020-02-08 NOTE — Progress Notes (Addendum)
[facility]  Pharmacy Consult Note, Pre-Operative Evaluation   Bariatric Surgery Clinic Pharmacy Recommendations    Name: Kayla Frey  MRN: [id number]  DOB: 1986-01-25  BMI: 63.37 kg/m2    Planned procedure: Sleeve Gastrectomy  Surgeon: Dr. 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       Allergies/Contraindications   Allergen Reactions    Iodine     Morphine     Shellfish Derived        Prior Outpatient Medication List:  Current Outpatient Medications on File Prior to Visit   Medication Sig Dispense Refill    biotin 10,000 mcg TABRAPID Take 1 tablet by mouth      cholecalciferol, vitamin D3, (VITAMIN D3) 1000 UNITS tablet Take 1 tablet (1,000 Units total) by mouth daily for 30 days 30 tablet 3    clotrimazole (LOTRIMIN) 1 % cream Apply topically Twice a day Use as instructed 30 g 1    docusate sodium (COLACE) 250 mg capsule Take 250 mg by mouth daily      EPINEPHrine (EPIPEN) 0.3 mg/0.3 mL injection Inject 0.3 mL (0.3 mg total) into the muscle once as needed for Anaphylaxis for up to 1 dose Use as instructed 1 each 6    escitalopram oxalate (LEXAPRO) 20 mg tablet Take 1 tablet (20 mg total) by mouth daily for 30 days 30 tablet 3    lactobacillus combo no.13 (PROBIOTIC PEARLS COMPLETE ORAL) Take by mouth 1 capsule once daily      UNABLE TO FIND Med Name: Lung Cleanse Gel Capsule 2 capsules once daily      ergocalciferol, vitamin D2, (ERGOCALCIFEROL) 50,000 unit capsule TAKE 1 CAPSULE BY MOUTH 1 TIME WEEKLY FOR 8 WEEKS (Patient not taking: Reported on 01/11/2020  ) 12 capsule 0    varenicline (CHANTIX) 1 mg tablet Take 1 tablet (1 mg total) by mouth Twice a day for 154 days For weeks 2-12. (Patient not taking: Reported on 02/08/2020  )  56 tablet 2     No current facility-administered medications on file prior to visit.       UPDATED Outpatient Medication List:  Your Medications at the End of This Visit       Disp Refills Start End    biotin 10,000 mcg TABRAPID        Sig - Route: Take 1 tablet by mouth - Oral    Class: Historical Med    cholecalciferol, vitamin D3, (VITAMIN D3) 1000 UNITS tablet 30 tablet 3 01/11/2020 02/10/2020    Sig - Route: Take 1 tablet (1,000 Units total) by mouth daily for 30 days - Oral    clotrimazole (LOTRIMIN) 1 % cream 30 g 1 01/13/2020     Sig - Route: Apply topically Twice a day Use as instructed - Topical    docusate sodium (COLACE) 250 mg capsule        Sig - Route: Take 250 mg by mouth daily - Oral    Class: Historical Med    EPINEPHrine (EPIPEN) 0.3 mg/0.3 mL injection 1 each 6 09/15/2019     Sig - Route: Inject 0.3 mL (0.3  mg total) into the muscle once as needed for Anaphylaxis for up to 1 dose Use as instructed - Intramuscular    Cosign for Ordering: Accepted by Lujean Amel, MD on 09/16/2019 12:57 PM    ergocalciferol, vitamin D2, (ERGOCALCIFEROL) 50,000 unit capsule 12 capsule 0 01/04/2020     Sig: TAKE 1 CAPSULE BY MOUTH 1 TIME WEEKLY FOR 8 WEEKS    Notes to Pharmacy: **Patient requests 90 days supply**    escitalopram oxalate (LEXAPRO) 20 mg tablet 30 tablet 3 01/11/2020 02/10/2020    Sig - Route: Take 1 tablet (20 mg total) by mouth daily for 30 days - Oral    lactobacillus combo no.13 (PROBIOTIC PEARLS COMPLETE ORAL)        Sig - Route: Take by mouth 1 capsule once daily - Oral    Class: Historical Med    UNABLE TO FIND        Sig: Med Name: Lung Cleanse Gel Capsule 2 capsules once daily    Class: Historical Med    varenicline (CHANTIX) 1 mg tablet 56 tablet 2 01/11/2020 06/13/2020    Sig - Route: Take 1 tablet (1 mg total) by mouth Twice a day for 154 days For weeks 2-12. - Oral          Assessment and Plan  Kayla Frey is a 34 y.o. female being seen in anticipation of bariatric surgery.   [x]  gallbladder  intact   [x]  signed up for Meds to [facility]    Preliminary VTE risk assessment is MILD (0.27% predicted probability of 30-day post-discharge VTE) based on the following criteria:  Patient characteristics:   Female   Age < 33 years   BMI ? 50 kg/m2   No CHF    No Dyspnea at Rest   No Paraplegia   No past history of VTE   No presence of congenital or acquired hypercoagulable     Final VTE risk assessment pending surgical course:   Assumptions in pre-op setting:   Yes to Non-Gastric Band Surgery (Sleeve or RYGB)   No to Operation Time ? 3 hours   No to Return to Operating Room   No to Length of Stay ? 3 days   *if any of the criteria change, please recalculate risk Lelon Frohlich Surg [phone])     Note the following regarding home meds as reported by patient:  - Patient stopped taking Varenicline several weeks ago because she felt that she no longer needed it for smoking cessation.  Potential to restart in the future.     PRE-surgery pharmacy recommendations  - HOLD all supplements (probiotic, lung cleanse, biotin, vitamin D) and NSAIDs for 2 weeks prior to surgery    POST-surgery pharmacy recommendations    **Please follow bariatric surgery pathway protocol    CONTINUE the following medications as you normally take them AFTER surgery:  - Clotrimazole (Lotrimin) cream  - Epinephrine (Epipen) injection as needed  - Docusate 250mg  capsule  - Probiotic pearl capsule  - Lung cleanse gel capsule  - Biotin 100,000 rapid dissolve tablet    CONTINUE the following medications, but CRUSH all tablets before taking them for 3 months AFTER surgery:  - Escitalopram (Lexapro) tablet  - Varenicline (Chantix) tablet:   o Discontinued for the past few weeks because patient feels she is able to quit smoking without this prescription. Potential to restart in the future.     DISCONTINUE the following medications AFTER surgery:  - Cholecalciferol (Vitamin D3) tablet daily: REPLACE  with new ProCare Multivitamin    NEW Medications to start on  Discharge:  Prescription:  1. Ursodiol 300mg  capsules (ONLY if gallbladder in place):   ? 1 capsule by mouth twice daily for 6 months   ? To prevent gallstones  2. Omeprazole 20mg  capsules:   ? 1 capsule by mouth daily for 6 months  ? Best taken 30 minutes before breakfast  ? To prevent stomach ulcers  3. Gabapentin capsule 300mg    ? 2 capsules (600mg ) by mouth nightly for 7 days  ? For pain  4. Acetaminophen (Tylenol) 500mg  tablet extra strength  ? CRUSH 2 tablets (1000mg ) take by mouth three times daily  ? For pain, take around the clock initially then transition to as needed for mild pain  ? Never take more than 4000mg  per day (maximum daily amount)  5. Pain medication: To be determined at discharge if additional needed    Over-the-counter:   1. ProCare Bariatric Complete Multivitamin capsules:  ? Take 1 capsule by mouth daily at bedtime for life  ? No gummy vitamins. They do not contain all the nutrients you need  ? Take separately from calcium         2. Celebrate Calcium Citrate 500mg  + Vitamin D 500unit softchews:   ? Take 1 softchew by mouth twice daily with breakfast and dinner for life  ? If you do not eat a diet rich in calcium, take one additional softchew with lunch daily  ? Take separately from multivitamin       Additional Comments  -------------------------------  Instruction to patient:  -- You will receive a 84-month supply of our preferred nutritional supplement regimen (ProCare Bariatric multivitamin + Celebrate Calcium Citrate/Vitamin D soft chew). Please be sure to purchase more before you run out. Note that the ProCare Bariatric Complete Multivitamin is currently available for purchase online at [email or web address] and sometimes .   Note that the Celebrate Calcium Citrate with Vitamin D Soft Chew is currently available for purchase online at [email or web address] and sometimes . If you are unable to obtain our preferred regimen, please refer to our alternate regimen in your  dietary information packet.  -- To access refills of your medications, you may go to any [facility]. If you would like to pick up your refills at a non-[facility] pharmacy, simply ask your preferred pharmacy to transfer your prescriptions [facility] [address] [facility].    Post Bariatric Surgery Medication Education  1. CRUSH all tablets for 3 months after surgery. May take capsules whole after surgery or OPEN capsules for 3 months after surgery. Please consult with outpatient pharmacist or clinic if starting a new medication during this time to make sure it can be crushed or opened.   2. PERMANENTLY discontinue use of all "NSAIDS" (Non-Steroidal Anti-Inflammatory Drugs), including aleve, motrin, ibuprofen, and naproxen. Avoid aspirin unless approved by your surgeon. TYLENOL (acetaminophen) is okay to take for pain.  3. PERMANENTLY discontinue use of all sleep aid medications (ex. benadryl, ambien) and benzodiazepines (ex. ativan, xanax, valium).    - Contraception: use a back-up barrier method (condoms) to prevent pregnancy, recommended to prevent pregnancy for at least one year after bariatric surgery    Home Pharmacy:  [facility], [facility] - [address] AT [facility] [address]  [address]  [place] [id number] [facility]  Phone: [phone] Fax: [phone]    Preferred Pharmacy on discharge:  [facility] [place] AT [place]   [address], [phone]  -  You have consented to the [facility]to[facility]Beds Program.    Your medications, including your over-the-counter supplements, will be delivered to your bedside prior to your hospital discharge.    Additional medication counseling points discussed:  - Adherence to medication  - Contraception  - Smoking cessation    I performed this evaluation using real-time telehealth tools, including a live video Zoom connection between my location and the patient's location. Prior to initiating, the patient consented to perform this evaluation using telehealth  tools.     Summary  I spent a total of 30 minutes virtually face-to-face with the patient reviewing medication plans, changes, new medications, counseling regarding dosing instructions and adherence. I completed and documented a full pre-operative medication reconciliation, review of therapeutic changes post-operatively, and counseled on new medications.     Patient expressed understanding. All questions were answered.      Arlyn Dunning, PharmD, MS  Clinical Pharmacist

## 2020-02-08 NOTE — Patient Instructions (Addendum)
Bariatric Surgery Clinic Pharmacy Recommendations    PRE-surgery pharmacy recommendations  - HOLD all supplements (probiotic, lung cleanse, biotin, vitamin D) and NSAIDs for 2 weeks prior to surgery    POST-surgery pharmacy recommendations      CONTINUE the following medications as you normally take them AFTER surgery:  - Clotrimazole (Lotrimin) cream  - Epinephrine (Epipen) injection as needed  - Docusate 250mg  capsule  - Probiotic pearl capsule  - Lung cleanse gel capsule  - Biotin 100,000 rapid dissolve tablet    CONTINUE the following medications, but CRUSH all tablets before taking them for 3 months AFTER surgery:  - Escitalopram (Lexapro) tablet  - Varenicline (Chantix) tablet (if you restart taking varenicline)     DISCONTINUE the following medications AFTER surgery:  - Cholecalciferol (Vitamin D3) tablet daily: REPLACE with new ProCare Multivitamin    NEW Medications to start on Discharge:  Prescription:  1. Ursodiol 300mg  capsules (ONLY if gallbladder in place):   ? 1 capsule by mouth twice daily for 6 months   ? To prevent gallstones  2. Omeprazole 20mg  capsules:   ? 1 capsule by mouth daily for 6 months  ? Best taken 30 minutes before breakfast  ? To prevent stomach ulcers  3. Gabapentin capsule 300mg    ? 2 capsules (600mg ) by mouth nightly for 7 days  ? For pain  4. Acetaminophen (Tylenol) 500mg  tablet extra strength  ? CRUSH 2 tablets (1000mg ) take by mouth three times daily  ? For pain, take around the clock initially then transition to as needed for mild pain  ? Never take more than 4000mg  per day (maximum daily amount)  5. Pain medication: To be determined at discharge if additional needed    Over-the-counter:   1. ProCare Bariatric Complete Multivitamin capsules:  ? Take 1 capsule by mouth daily at bedtime for life  ? No gummy vitamins. They do not contain all the nutrients you need  ? Take separately from calcium         2. Celebrate Calcium Citrate 500mg  + Vitamin D 500unit softchews:   ? Take 1  softchew by mouth twice daily with breakfast and dinner for life  ? If you do not eat a diet rich in calcium, take one additional softchew with lunch daily  ? Take separately from multivitamin       Additional Comments  -------------------------------  Instruction to patient:  -- You will receive a 57-month supply of our preferred nutritional supplement regimen (ProCare Bariatric multivitamin + Celebrate Calcium Citrate/Vitamin D soft chew). Please be sure to purchase more before you run out. Note that the ProCare Bariatric Complete Multivitamin is currently available for purchase online at [email or web address] and sometimes [email or web address]   Note that the Celebrate Calcium Citrate with Vitamin D Soft Chew is currently available for purchase online at [email or web address] and sometimes [email or web address] If you are unable to obtain our preferred regimen, please refer to our alternate regimen in your dietary information packet.  -- To access refills of your medications, you may go to any [facility]. If you would like to pick up your refills at a non-[facility] pharmacy, simply ask your preferred pharmacy to transfer your prescriptions from [place] at [place].    Post Bariatric Surgery Medication Education  1. CRUSH all tablets for 3 months after surgery. May take capsules whole after surgery or OPEN capsules for 3 months after surgery. Please consult with outpatient pharmacist or clinic if starting a new medication during this time to make sure  it can be crushed or opened.   2. PERMANENTLY discontinue use of all "NSAIDS" (Non-Steroidal Anti-Inflammatory Drugs), including aleve, motrin, ibuprofen, and naproxen. Avoid aspirin unless approved by your surgeon. TYLENOL (acetaminophen) is okay to take for pain.  3. PERMANENTLY discontinue use of all sleep aid medications (ex. benadryl, ambien) and benzodiazepines (ex. ativan, xanax, valium).    - Contraception: use a back-up barrier method (condoms) to prevent pregnancy,  recommended to prevent pregnancy for at least one year after bariatric surgery

## 2020-02-09 MED ORDER — ESCITALOPRAM 20 MG TABLET: 20 mg | ORAL | 3 refills | Status: AC

## 2020-02-29 ENCOUNTER — Ambulatory Visit
Admit: 2020-02-29 | Payer: MEDICAID | Attending: Student in an Organized Health Care Education/Training Program | Primary: Student in an Organized Health Care Education/Training Program

## 2020-02-29 DIAGNOSIS — Z720 Tobacco use: Secondary | ICD-10-CM

## 2020-02-29 DIAGNOSIS — F419 Anxiety disorder, unspecified: Secondary | ICD-10-CM

## 2020-02-29 DIAGNOSIS — Z6841 Body Mass Index (BMI) 40.0 and over, adult: Secondary | ICD-10-CM

## 2020-02-29 DIAGNOSIS — E559 Vitamin D deficiency, unspecified: Secondary | ICD-10-CM
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
## 2020-02-29 NOTE — Patient Instructions (Addendum)
Dear [name],     It was good to see you in clinic today. This document is your After Visit Summary, which includes what we discussed in clinic today, what the next steps are, and a list of your medications.     Things you should do after you leave clinic today:    For the mood symptoms:    - Please complete mood questionnaires    - Continue lexapro 20mg  daily    For the weight issues:     - Please let me know the final date once you hear back for surgery    For tobacco    - Please ensure follow up with     - Continue the chantix as prescribed    I will send you a mychart scheduling appointment to see you in September      It was a pleasure seeing you today. Please don't hesitate to reach out if you have any additional questions or concerns.    Best,    October, MD

## 2020-04-04 MED ORDER — VARENICLINE 1 MG TABLET: 1 mg | ORAL | 0 refills | Status: DC

## 2020-04-04 MED ORDER — ESCITALOPRAM 20 MG TABLET: 20 mg | ORAL | 3 refills | Status: DC

## 2020-04-04 NOTE — Telephone Encounter (Signed)
Refilled lexapro 20mg  every day and offered in-person visit instead of VV for feet swelling with additional conservative mgmt recs

## 2020-04-04 NOTE — Telephone Encounter (Signed)
Current pended order:  varenicline (CHANTIX) 1 mg tablet  Take 1 tablet (1 mg total) by mouth Twice a day for 154 days For weeks 2-12.    Last time approved:  varenicline (CHANTIX) 1 mg tablet  Take 1 tablet (1 mg total) by mouth Twice a day for 154 days For weeks 2-12.  Dispense: 56 tablet Refill: 2  [facility] 1545 2 by Gershon Crane on 01/11/20      Last appointment: Visit date not found  Next appointment: 05/03/2020 Elsie Amis, MD    Last  BP:  BP Readings from Last 3 Encounters:   No data found for BP       Last Labs:    Lab Results   Component Value Date    Creatinine 0.96 01/02/2020    Sodium, Serum / Plasma 140 01/02/2020    Potassium, Serum / Plasma 4.0 01/02/2020    Hemoglobin A1c 5.1 01/02/2020    Cholesterol, Total 175 01/02/2020    LDL Cholesterol 124 01/02/2020    Cholesterol, HDL 39 (L) 01/02/2020    Thyroid Stimulating Hormone 1.15 01/02/2020    Hemoglobin 12.1 01/02/2020    eGFR - low estimate 78 01/02/2020    eGFR - high estimate 91 01/02/2020       If patient has not been seen in clinic for 12 months or more I have routed encounter to the administrative team to book a follow-up appointment.

## 2020-04-04 NOTE — Telephone Encounter (Signed)
lexapro not on current med list

## 2020-04-09 IMAGING — DX DG CHEST 2V
2 series · 2 of 2 positions shown · non-contrast
Comparison: No recent prior.

CLINICAL DATA: Chest pain.

EXAM:
CHEST - 2 VIEW

[chest pa]
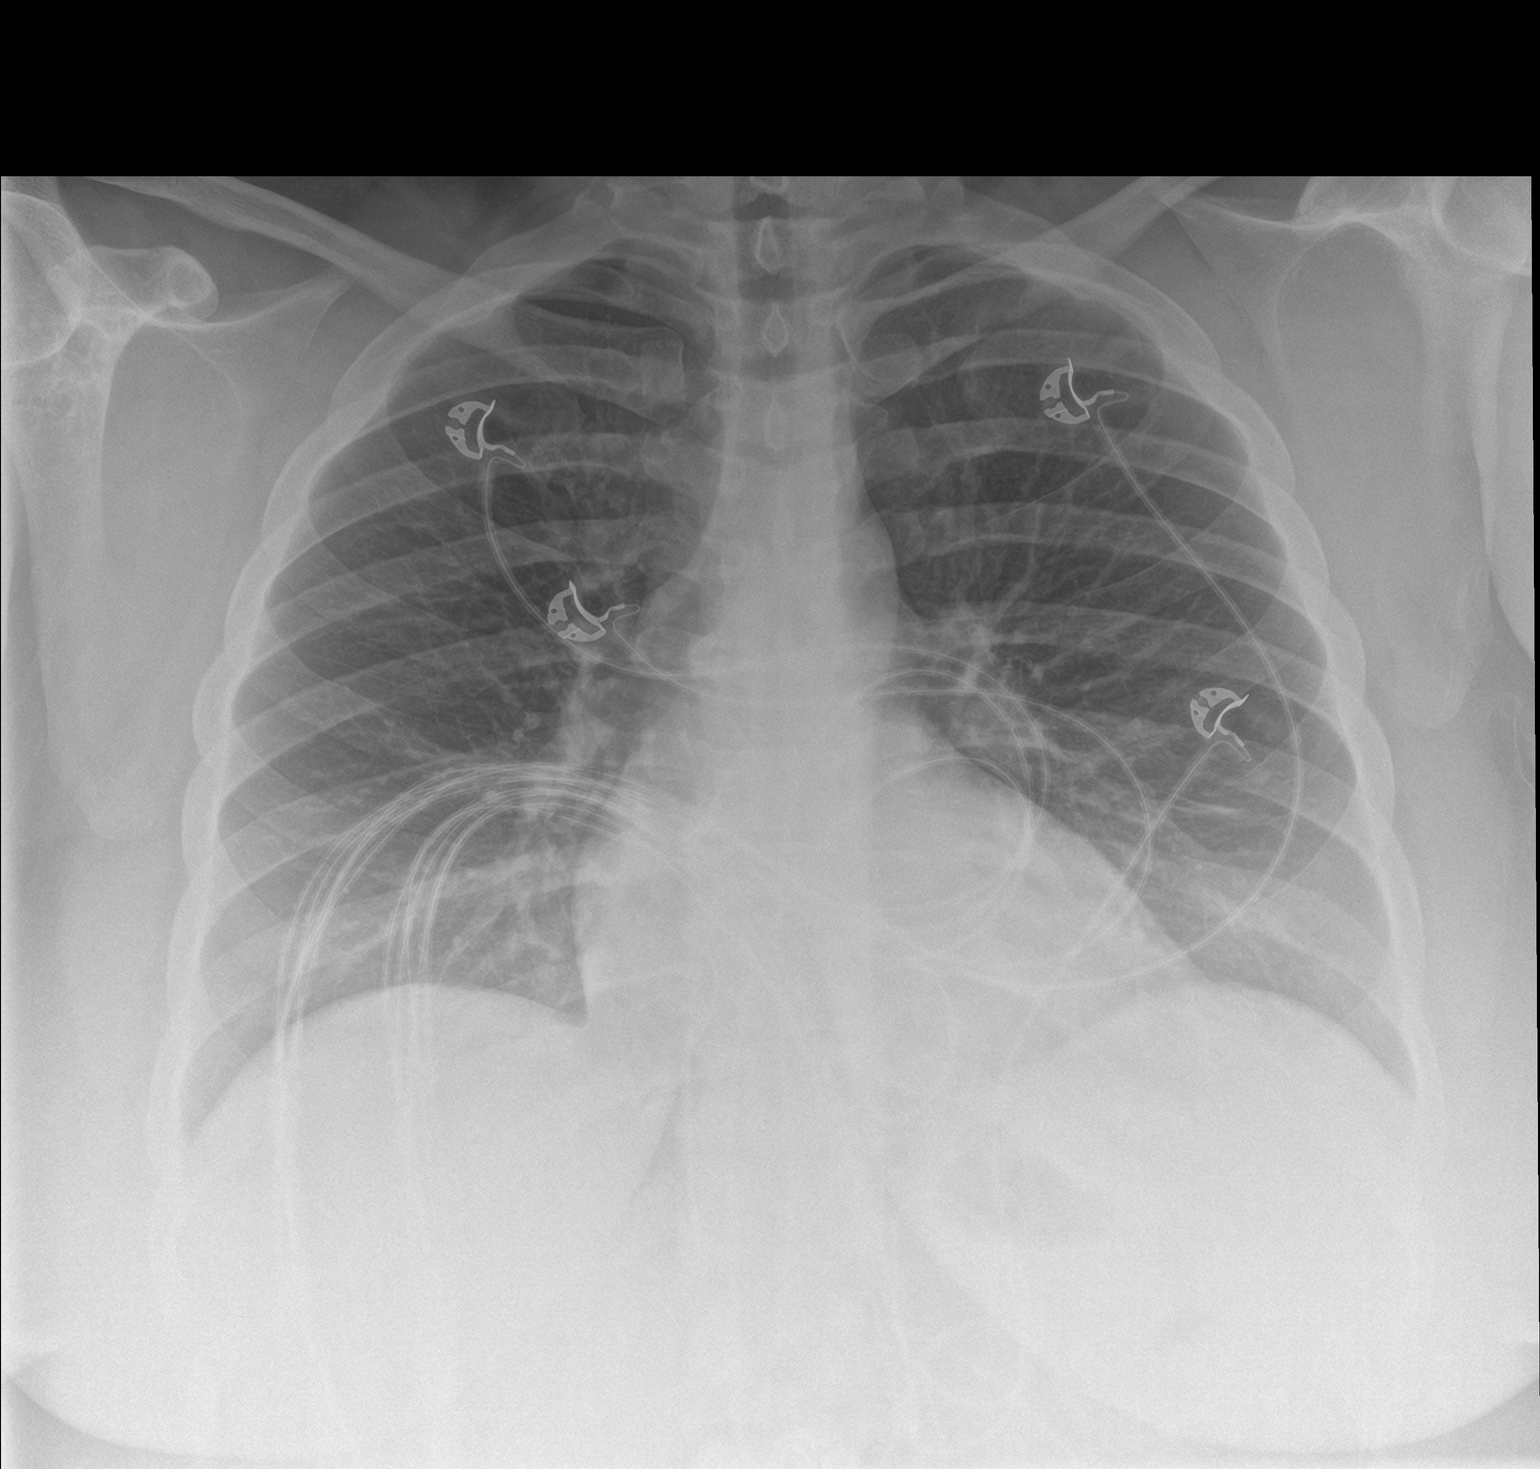

[chest lat]
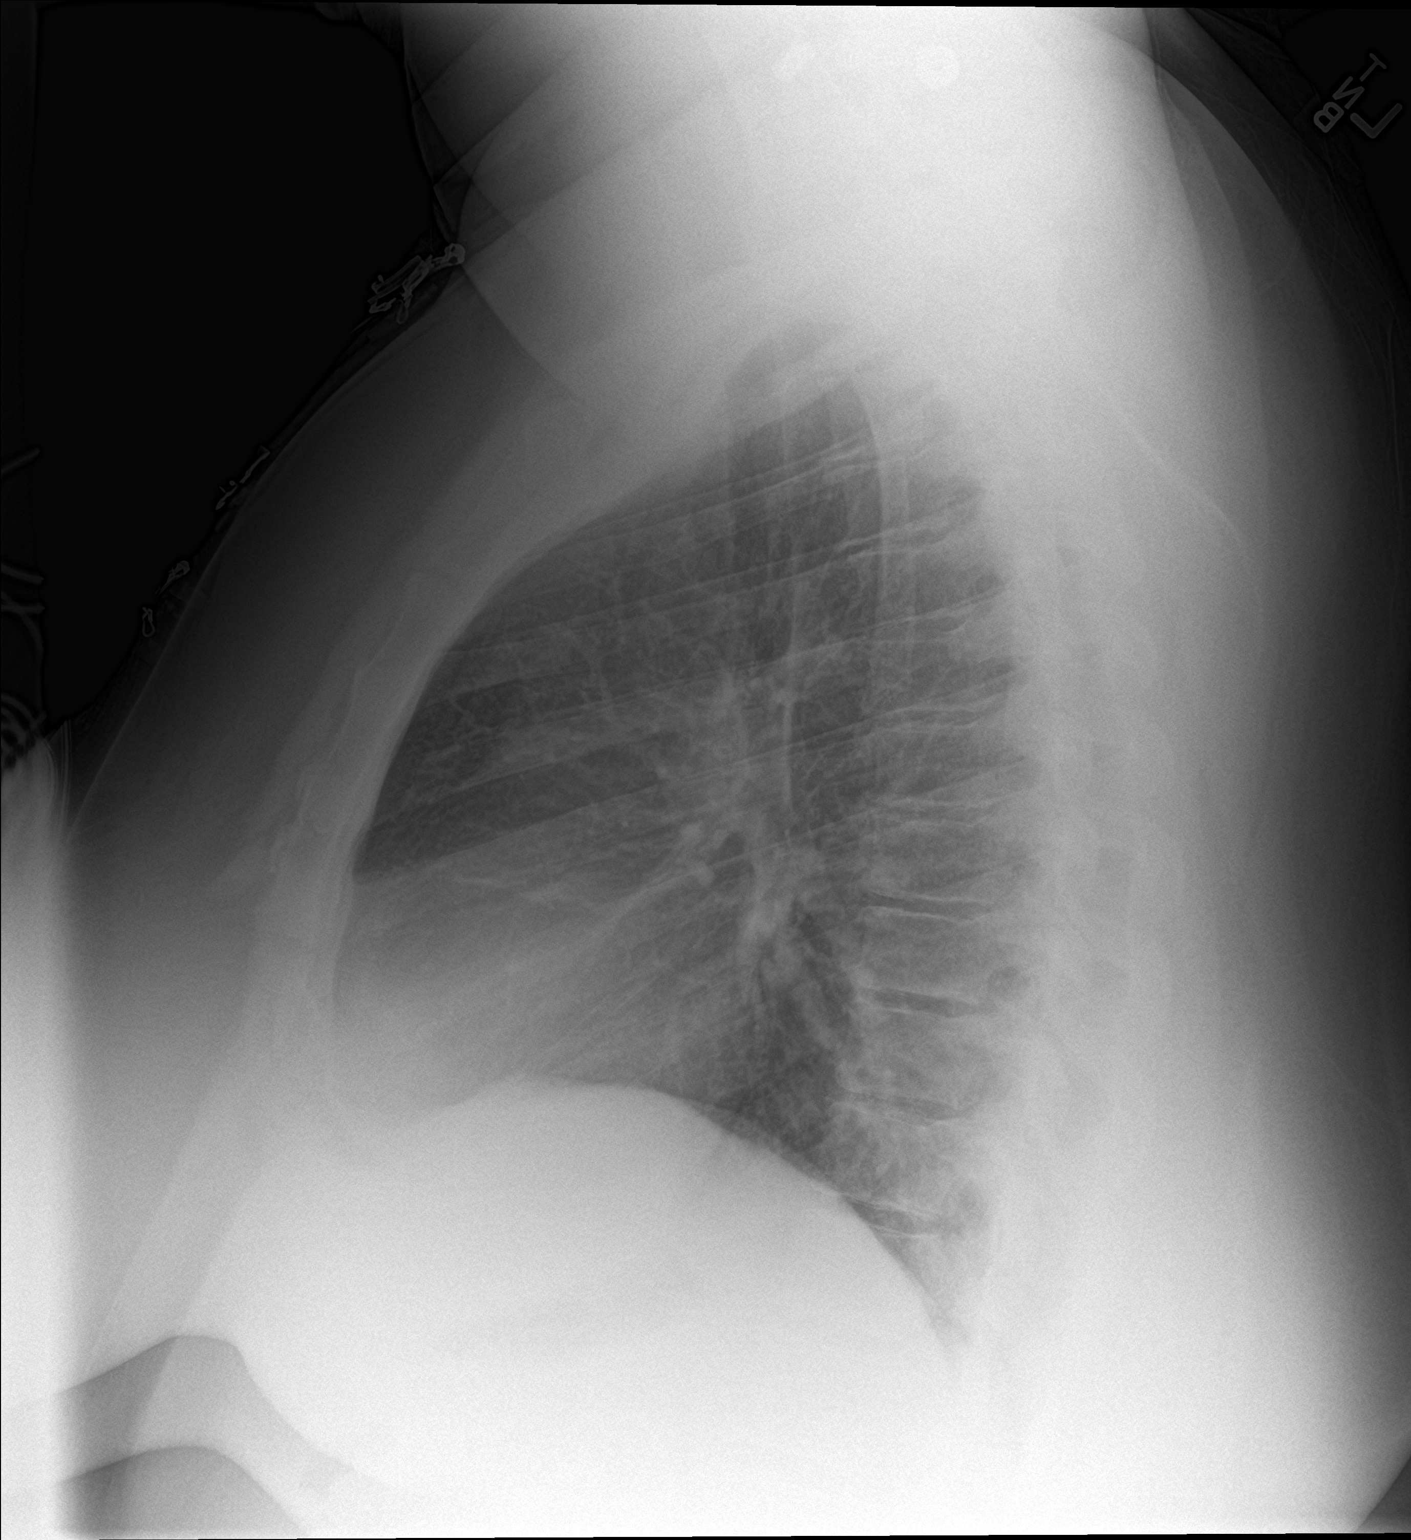

[2 of 2 positions shown; findings below may reference images not displayed]

FINDINGS: Mediastinum and hilar structures normal. Low lung volumes with mild
bibasilar atelectasis/infiltrates. No pleural effusion or
pneumothorax. Heart size normal.
IMPRESSION: Low lung volumes with mild bibasilar atelectasis/infiltrates.

## 2020-04-09 MED ORDER — BUPROPION HCL SR 150 MG TABLET,12 HR SUSTAINED-RELEASE: 150 mg | ORAL | 0 refills | Status: AC

## 2020-04-09 NOTE — Telephone Encounter (Signed)
Started bupropion for tobacco cessation. Counseled on SEs and poss interactions w lexapro

## 2020-04-25 NOTE — Telephone Encounter (Signed)
[facility])    Called patient to "check in" on how his smoking cessation efforts are progressing and to see if he needs additional information or any help with problem solving.    Patient states she decided against app't with Dr. Dagoberto Reef. Started Chantix when we last spoke and felt confident she could do it on her own.    Remains smoke-free since at least 01/18/20.  Congratulated patient.

## 2020-05-07 NOTE — Telephone Encounter (Signed)
Dear Kayla Frey- please call pt and schedule for first available floor two provider this week for new elevated BP and leg swelling evaluation.    Thanks!

## 2020-05-08 NOTE — Telephone Encounter (Signed)
Called and spoke with pt, scheduled for this Thursday 9/16 at 2:15 with Dr. Francia Greaves for elevated BP and leg swelling evaluation

## 2020-05-10 ENCOUNTER — Ambulatory Visit
Admit: 2020-05-12 | Payer: MEDICAID | Attending: Student in an Organized Health Care Education/Training Program | Primary: Student in an Organized Health Care Education/Training Program

## 2020-05-10 DIAGNOSIS — R030 Elevated blood-pressure reading, without diagnosis of hypertension: Secondary | ICD-10-CM

## 2020-05-10 NOTE — Telephone Encounter (Signed)
Patient calling for the following reason:    Has OV with MD Francia Greaves today for hypertension and leg edema   Pt can not clear the Covid questions- cough and congestion   Pt has a BP monitor at home and knows how to use   Converted OV to VV  Best number to reach caller at: same  Patient's home phone number: [phone]       OTT Reason for Call: Admin - appt
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
## 2020-05-10 NOTE — Patient Instructions (Addendum)
--   It was very nice to see you today!  Please send me a message via MyChart anytime with any questions about your health, or call our office with any urgent concerns.    -- Please stop taking the blood pressure medicine and come to clinic in 1 week to get your blood pressure taken    -- If you do choose to continue taking your brother's medicine, please do not take more than 25 mg at a time    -- Please go to the emergency room if your blood pressure is greater than 180 and you have symptoms such as chest pain, headache, blurry vision

## 2020-05-11 NOTE — Telephone Encounter (Signed)
Admin, please call patient to help obtain outside record for COVID test so that she can be cleared to come to [facility] for in person appointment next week. Thank you.

## 2020-05-14 ENCOUNTER — Ambulatory Visit: Admit: 2020-05-14 | Payer: MEDICAID | Primary: Student in an Organized Health Care Education/Training Program

## 2020-05-14 DIAGNOSIS — E559 Vitamin D deficiency, unspecified: Secondary | ICD-10-CM

## 2020-05-14 DIAGNOSIS — N926 Irregular menstruation, unspecified: Secondary | ICD-10-CM

## 2020-05-14 DIAGNOSIS — D72829 Elevated white blood cell count, unspecified: Secondary | ICD-10-CM

## 2020-05-14 LAB — COMPLETE BLOOD COUNT WITH DIFF
Abs Basophils: 0.03 10*9/L (ref 0.00–0.10)
Abs Eosinophils: 0.16 10*9/L (ref 0.00–0.40)
Abs Imm Granulocytes: 0.06 10*9/L
Abs Lymphocytes: 3.16 10*9/L (ref 1.00–3.40)
Abs Monocytes: 0.65 10*9/L (ref 0.20–0.80)
Abs Neutrophils: 6.97 10*9/L — ABNORMAL HIGH (ref 1.80–6.80)
Hematocrit: 38.1 % (ref 36.0–46.0)
Hemoglobin: 12.2 g/dL (ref 12.0–15.5)
MCH: 29.1 pg (ref 26.0–34.0)
MCHC: 32 g/dL (ref 31.0–36.0)
MCV: 91 fL (ref 80–100)
Platelet Count: 398 10*9/L (ref 140–450)
RBC Count: 4.19 10*12/L (ref 4.00–5.20)
WBC Count: 11 10*9/L — ABNORMAL HIGH (ref 3.4–10.0)

## 2020-05-14 LAB — VITAMIN D, 25-HYDROXY: Vitamin D, 25-Hydroxy: 19 ng/mL — ABNORMAL LOW (ref 20–50)

## 2020-05-14 MED ORDER — EPINEPHRINE 0.3 MG/0.3 ML INJECTION, AUTO-INJECTOR
0.3 | Freq: Once | INTRAMUSCULAR | 6 refills | Status: AC | PRN
Start: 2020-05-14 — End: 2021-06-17

## 2020-05-14 MED ORDER — ERGOCALCIFEROL (VITAMIN D2) 1,250 MCG (50,000 UNIT) CAPSULE
50000 | ORAL_CAPSULE | ORAL | 3 refills | Status: AC
Start: 2020-05-14 — End: 2020-09-03

## 2020-05-14 NOTE — Result Quicknote (Signed)
I reviewed the recent tests. There is nothing concerning about the results.

## 2020-05-14 NOTE — Telephone Encounter (Signed)
Current pended order:  EPINEPHrine (EPIPEN) 0.3 mg/0.3 mL injection  Inject 0.3 mL (0.3 mg total) into the muscle once as needed for Anaphylaxis for up to 1 dose Use as instructed    Last time approved:  EPINEPHrine (EPIPEN) 0.3 mg/0.3 mL injection  Inject 0.3 mL (0.3 mg total) into the muscle once as needed for Anaphylaxis for up to 1 dose Use as instructed  Dispense: 1 each Refill: 6  [facility] 1545 2 by Magda Paganini A on 09/15/19        Last office visit:    Visit date not found   Last video visit:     05/10/2020   Last scheduled telephone encounter:  Visit date not found    Next appointment:  05/15/2020 Linus Salmons Phillips-Bright, NP    Last  BP:  BP Readings from Last 1 Encounters:   No data found for BP       Last Labs:    Lab Results   Component Value Date    Creatinine 0.96 01/02/2020    Sodium, Serum / Plasma 140 01/02/2020    Potassium, Serum / Plasma 4.0 01/02/2020    Hemoglobin A1c 5.1 01/02/2020    Cholesterol, Total 175 01/02/2020    LDL Cholesterol 124 01/02/2020    Cholesterol, HDL 39 (L) 01/02/2020    Thyroid Stimulating Hormone 1.15 01/02/2020    Hemoglobin 12.2 05/14/2020    eGFR - low estimate 78 01/02/2020    eGFR - high estimate 91 01/02/2020       If patient has not been seen in clinic for 12 months or more I have routed encounter to the administrative team to book a follow-up appointment.

## 2020-05-14 NOTE — Telephone Encounter (Signed)
Pt clarified ran out of Vit D. Refilled Rx and ordered re-check in 3 mo

## 2020-05-14 NOTE — Addendum Note (Signed)
Addended by: Park Liter on: 05/14/2020 04:56 PM     Modules accepted: Orders

## 2020-05-14 NOTE — Telephone Encounter (Addendum)
WBC elevated and Vit D low. Notified pt, clarifying if taking daily vit D and will re-check CBC in one month. Has appt w me this mo

## 2020-05-14 NOTE — Telephone Encounter (Signed)
Called, left message and sent pt mychart message to complete ROI form so we can obtain pt outside records for COVID 19 test so she is cleared to come into clinic for her appt with NP Francee Piccolo Bright

## 2020-05-16 LAB — TESTOSTERONE, TOTAL, IMMUNOASS: Testosterone,Total: 40 ng/dL (ref 9–55)

## 2020-05-17 LAB — HYDROXYPROGESTERONE, 17-: Hydroxyprogesterone, 17-: 22 ng/dL

## 2020-05-23 ENCOUNTER — Ambulatory Visit
Admit: 2020-05-23 | Payer: MEDICAID | Attending: Student in an Organized Health Care Education/Training Program | Primary: Student in an Organized Health Care Education/Training Program

## 2020-05-23 DIAGNOSIS — Z0000 Encounter for general adult medical examination without abnormal findings: Secondary | ICD-10-CM

## 2020-05-23 DIAGNOSIS — Z6841 Body Mass Index (BMI) 40.0 and over, adult: Secondary | ICD-10-CM

## 2020-05-23 DIAGNOSIS — R030 Elevated blood-pressure reading, without diagnosis of hypertension: Secondary | ICD-10-CM

## 2020-05-23 DIAGNOSIS — F419 Anxiety disorder, unspecified: Secondary | ICD-10-CM

## 2020-05-23 DIAGNOSIS — E559 Vitamin D deficiency, unspecified: Secondary | ICD-10-CM

## 2020-05-23 NOTE — Patient Instructions (Addendum)
Dear Kayla Frey,     It was good to see you in clinic today. This document is your After Visit Summary, which includes what we discussed in clinic today, what the next steps are, and a list of your medications.     Things you should do after you leave clinic today:    Amazing job with your continued smoking cessation! No need to start the bupropion since your cravings appear to be well-controlled    For your mood symptoms    - Continue the lexapro 20mg  once daily     - Please re-schedule the appointment to meet your new therapist    For your weight loss    - I will be thinking of you and wishing you the best for your upcoming surgery on 10/7!    For your blood pressure    - We will schedule an in-person clinic visit for early November so that we can measure your blood pressure and start medications if necessary    For your routine healthcare    - We will do a PAP (cervical cancer screening) in November    - I also strongly encourage you to consider getting the COVID19 and flu vaccines. I have included some up to date information about the COVID vaccine below. Happy to continue discussing this at future appointments     Who is eligible for vaccination?  All persons 16 years and older.  Children to age 27 are eligible for the Pfizer vaccine.     How can I schedule my vaccine appointment or where can I drop in?      Online:      [email or web address]       Phone:      You may call the MyTurn scheduling number.  -  MyTurn  ([place] COVID-19 Hotline): [phone] (M-F 8AM-8PM, Sa-Su 8AM-5PM)      [facility] Locations - drop in or call to schedule   Patients may drop-in or schedule an appointment   Scheduling line:  [phone]   For more information visit:  [phone]    Locations, Days & Time:    [facility] campus  [address] First Floor  [address] [facility]  Wednesdays, 8 a.m. - noon     [address][birth year][address] Building  [address] Fourth Floor  Mondays and Thursdays, 8 a.m. - noon      [place] [facility]  [address]  Tuesdays, 8 - 3 p.m., and Fridays, 10 a.m. - 4:30 p.m.        Retail Pharmacy - the following pharmacies have a partnership with [facility] to deliver vaccine to our patients.   1.      [address], [place], [place]   a.                   Phone: [phone]   b.                  Hours: M-F 9AM-9PM   2.      [address] [place], [address] [place]    a.                   Phone: [phone]   b.                  Hours: M-F 9AM-9PM   3.      [address], [place], [address] [place]   a.  Phone: [phone]   b.                  Hours: M-F 8AM-9PM   4.      [address] [place] [address]    a.                   Phone: [phone]   b.                  Hours: M-F 9AM-9PM   5.      [address], [place], [place] [address]   a.                   Phone: [phone]   b.                  Hours: M-F 8AM-9PM   6.      [address] [place], [place], [address]    a.                   Phone: [phone]   b.                  Hours: M-F 8AM-9PM   7.      [address] [place], [place] [address]   a.                   Phone: [phone]   b.                  Hours: M-F 8AM-9PM   8.      [address] [place], [place] [address]   a.                  Phone: [phone]   b.                  Hours: M-F 8AM-9PM       Full list of vaccine locations  This website lists vaccine sites in the area, including local pharmacies:  [email or web address]       Sincerely,  Cottie Banda                            It was a pleasure seeing you today. Please don't hesitate to reach out if you have any additional questions or concerns.    Best,    Park Liter, MD
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
## 2020-05-24 NOTE — Anesthesia Pre-Procedure Evaluation (Addendum)
[facility]  Anesthesia Preprocedure Evaluation    Procedure Information     Case: [id number] Date/Time: 05/31/20 0730    Procedure: LAPAROSCOPIC SLEEVE GASTRECTOMY FOR WEIGHT LOSS (N/A Abdomen)    Diagnosis: Severe obesity (CMS code) [E66.01]    Pre-op diagnosis: Severe obesity (CMS code) [E66.01]    Location: [facility] OR [facility] / [facility]    Surgeons: Georgia Duff, MD          Precautions          None          Relevant Problems   No relevant active problems         Anesthesia Encounter History        CC/HPI/Past Medical History Summary: 34 yo female w/morbid obesity, s/f Laparoscopic sleeve gastrectomy on 05/31/20 w/Dr. Montez Morita    PMH  - Pt denies cardiopulmonary dz.  BP was elevated over the summer but reportedly is now in 150's/80's.  No meds at this time and will revisit with her PCP after surgery.  - OCD/Anxiety - controlled w/Lexapro    (Please refer to APeX Allergies, Problems, Past Medical History, Past Surgical History, Social History, and Family History activities, Results for current data from these respective sections of the chart; these sections of the chart are also summarized in reports, including the Patient Summary Extracts found in Chart Review)      Summary of Outside Records:    01/02/20 ECG:  IMPRESSION:   Normal sinus rhythm  Normal ECG  No previous ECGs available  Confirmed by LEE, BYRON ([id number]) on 01/02/2020 10:41:48 AM  Summary of Prior Anesthetics: No prior anesthetics  Unaware family history of adverse reactions to anesthesia.      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 No pertinent surgical history.  Allergies/Contraindications   Allergen Reactions    Iodine Hives and Itching     IV Iodine.  Unsure about topical iodine    Shellfish Derived Anaphylaxis    Tree Nuts Anaphylaxis    Morphine Nausea And Vomiting     Current Medications       Dosage    biotin 10,000 mcg TABRAPID Take 1 tablet by mouth    clotrimazole (LOTRIMIN) 1 % cream Apply topically Twice a day Use as instructed    EPINEPHrine (EPIPEN) 0.3 mg/0.3 mL injection Inject 0.3 mL (0.3 mg total) into the muscle once as needed for Anaphylaxis for up to 1 dose Use as instructed    ergocalciferol, vitamin D2, (ERGOCALCIFEROL) 50,000 unit capsule Take 1 capsule (50,000 Units total) by mouth every 7 (seven) days for 112 days Use as instructed    escitalopram oxalate (LEXAPRO)  20 mg tablet Take 1 tablet (20 mg total) by mouth daily    lactobacillus combo no.13 (PROBIOTIC PEARLS COMPLETE ORAL) Take by mouth 1 capsule once daily        Ht 170.2 cm (5' 7.01")   Wt (!) 198.5 kg (437 lb 9.6 oz)   LMP  (LMP Unknown)   BMI 68.52 kg/m          Review of Systems Functional Status: Climb a flight of stairs or walk up a hill (5.50 METs) Lives in 2 story house and goes up/down stairs regularly.  Walks to store.  Denies exertional cp/sob  Constitutional: Negative for activity change, appetite change, chills and fever.   Airway: Positive for other dental problems (chipped teeth.) and snoring. Negative for limited neck movement, difficulty opening mouth, loose teeth, dental hardware, TMJ problem, witnessed apnea and CPAP  HENT: Negative for hearing loss, trouble swallowing and loose teeth.   Eyes: Negative for visual disturbance.   Respiratory: Negative for Recent URI Symptoms and shortness of breath.    Cardiovascular: Negative.  Negative for chest pain.    Gastrointestinal: Negative.  Negative for Negative for GERD symptoms.   Genitourinary: Negative.  Negative for dysuria.        Overactive bladder.  No sx of UTI   Musculoskeletal: Negative.    Neurological: Negative.    Hematological: Does not bruise/bleed easily.   Endocrine/Metabolic:   Endo/Heme/Allergy negative    Psychiatric/Behavioral/Developmental:        OCD/Anxiety - Controlled w/Lexapro       Physical Exam    Airway:   Modified Mallampati score: II. Thyromental distance: > 6.5 cm. Mouth opening: good. Neck range of motion: full. Cardiovascular:      Rate and Rhythm: Regular rhythm.   Pulmonary:      Effort: Pulmonary effort is normal.      Breath sounds: Normal breath sounds.     Dental: The patient has no loose teeth.  She has no chipped or damaged teeth.  The patient has no missing teeth.        PC only, needs PE on DOS.  AVS discussed and sent to My Chart.  [place], NP  Prepare (Pre-Operative Clinic) Assessment/Plan/Narrative  Pt denies pregnancy?  Yes.  Not sexually active in at least 1 month  Does patient wish pregnancy test? No, but is agreeable to a test.  Ordered for MOS prn  Would a positive pregnancy test change her decision about surgery?  N/A        Obstructive Sleep Apnea Screening  STOPBANG Score:      S Do you Snore loudly (louder than talking or loud  enough to be heard through closed doors)? No    T Do you often feel Tired, fatigued, or sleepy during daytime? No    O Has anyone Observed you stop breathing during  your sleep? No    P Do you have or are you being treated for high blood Pressure? No    B BMI more than 35 KG/m^2? Yes    A Age over 65 years old? No    N Neck circumference > 16 inches (40cm)? Yes    G Gender: Female? No    STOPBANG Total Score 2         Risk Level (based only on STOPBANG) Low       CPAP/BiPAP prescribed - No.              Anesthesia Assessment and Plan  Day  Of Surgery Provider Chart Review:  NPO status verified  Medications reviewed  Allergies reviewed  Problem list  reviewed  Anesthesia history reviewed  Pertinent labs reviewed  Consults reviewed    ASA 3       Anesthesia Plan  Anesthesia Type: general  Induction Technique: IntraVenous  Invasive Monitors/Vascular Access: None  Airway Plan: oral ET tube  Possible Advanced Airway Techniques: None  Other Techniques: None  Planned Recovery Location: PACU    Blood Product Preparation  Blood Products Plan: Reviewed peri-op blood report  Consent: reviewed consent for blood transfusion  Plan: Appropriate blood products available/type and screen performed  Comments:    Anesthesia Potential Complication Discussion  There is the possibility of rare but serious complications.    Informed Consent for Anesthesia  Consent obtained from patient    Risks, benefits and alternatives including those of invasive monitoring discussed. Increased risks (as above) discussed.  Questions invited and all answered.  Interpreter: N/A - patient/guardian's preferred language is [place].  Consent granted for anesthetic plan    Quality Measure Documentation   Opioid Therapy Planned? Yes    (See Anesthesia Record for attending attestation)    [Please note, smart link data included in this note may not reflect changes since note creation. Please see appropriate section of APeX for up-to-the minute information.]

## 2020-05-24 NOTE — Patient Instructions (Signed)
We want your upcoming surgical visit to be as safe and comfortable as possible. The following instructions are designed to prepare you for your surgical hospitalization.  Please read and follow all instructions carefully.    Procedure Location  Your procedure is scheduled to take place at [facility] -  OR Arrivals: [address] 1st Floor. Report to Room M104J at assigned ARRIVAL time noted below. Phone [phone]    Procedure Date and Time  Please arrive on 05/31/2020 per surgeon's office.       Contact your surgeon's office if you have not received your time of arrival for the day surgery    If for any reason your surgery start time is changed, you will receive a call from your surgeons office.  Should you have any questions regarding the date or time of arrival, please call your surgeons office and ask to speak with his/her practice assistant.    Preparing for Surgery  What can I eat or drink on the day of surgery?   Please do not have anything to eat or drink except clear liquids after midnight the evening before your surgery (including gum, candy or mints).   You may have clear liquids on the day of surgery up to 2 hours prior to arrival.   Clear liquids include:   Non-pulp, clear apple juice.   Tea with sugar or sweetener (NO milk, cream, or milk substitute)   Gatorade   Water   If you drink anything other than clear liquids on the day of surgery, or if you have ANYTHING to drink in the two hours prior to hospital arrival, then your doctors will cancel your surgery for your safety.   If you have been instructed to take any medications the day of surgery, take them with a small sip of water.     If your surgeon has given you additional instructions about what to eat or drink before the day of surgery, please follow those instructions as well.    Which medications should I take on the day of surgery?   Kayla Frey   Prior to Surgery Medication Instructions [id number]    Printed  on:05/24/20 1018   Medication Information Take Morning of Surgery Special Instructions          biotin 10,000 mcg TABRAPID  (biotin)  Take 1 tablet by mouth   Don't take on morning of surgery     clotrimazole (LOTRIMIN) 1 % cream  (clotrimazole)  Apply topically Twice a day Use as instructed   Don't use on morning of surgery Don't use evening before surgery    EPINEPHrine (EPIPEN) 0.3 mg/0.3 mL injection  (epinephrine)  Inject 0.3 mL (0.3 mg total) into the muscle once as needed for Anaphylaxis for up to 1 dose Use as instructed   Use as needed     ergocalciferol, vitamin D2, (ERGOCALCIFEROL) 50,000 unit capsule  (ergocalciferol (vitamin D2))  Take 1 capsule (50,000 Units total) by mouth every 7 (seven) days for 112 days Use as instructed   May take on scheduled day but not on morning of surgery     escitalopram oxalate (LEXAPRO) 20 mg tablet  (escitalopram oxalate)  Take 1 tablet (20 mg total) by mouth daily   Take on morning of surgery     lactobacillus combo no.13 (PROBIOTIC PEARLS COMPLETE ORAL)  (lactobacillus combo no.13)  Take by mouth 1 capsule once daily   Don't take on morning of surgery  Preparing to Come to the [facility] with Hibiclens (Chlorhexidine) soap or chlorhexidine cloth to prevent infections     Instructions:   You should shower with Hibiclens soap or chlorhexidine cloth at least two times before your surgery-on the night before surgery and the morning before your surgery.     How to shower with Hibiclens soap:   1.  Rinse your body with warm water.   2.  Wash your hair with regular shampoo.   Rinse your hair with water. If you are having neck surgery, use Hibiclens soap instead of your regular shampoo to wash your hair.   Rinse your hair with water.   3.  Wet a clean sponge. Turn off the water. Apply Hibiclens soap liberally.   4.  Firmly massage all areas: neck, arms, chest, back, abdomen, hips, groin, genitals (external only) and buttocks. Avoid using Hibiclens soap on your  face. Clean your legs and feet and between your fingers and toes. Pay special attention to the site of your surgery and all surrounding skin. Ask for help to clean your back if you are having back surgery.   5.  Lather again before rinsing.   6.  Turn on the water and rinse Hibiclens off your body.   7.  Dry off with a clean towel.   8.  Don't apply lotions or powders.   9.  Use clean clothes and freshly laundered bed linens    How to wash with Ready Prep CHG (Chlorhexidine Cloth)              1.  Rinse your body with warm water.         2.  Wash your hair with regular shampoo. Rinse your hair with water.        3.  Use one cloth to cleanse treatment area        4.  Vigorously scrub skin back and forth for three minutes        5.  Allow area to dry for one minute. Do not rinse        6.  Do not use on open wounds   7. See package details for more instructions    Important showering reminders:    Do not use any other soaps or body wash when using Hibiclens. Other soaps can block the Hibiclens benefits.    After showering, do not apply lotion, cream, powder, deodorant, or hair conditioner.    Do not shave or remove body hair. Shaving your face is generally fine. If you are having head surgery, however, ask your doctor whether you can shave.    Hibiclens is safe to use on minor wounds, rashes, burns, and over staples and stitches.    Allergic reactions are rare but may occur. If you have an allergic reaction, stop using Hibiclens and call your doctor if you have a skin irritation.    If you are allergic to Hibiclens, please follow the bathing instructions above using an over-the-counter regular soap instead of Hibiclens..    If your surgeon has given you additional instructions, please follow those.           Wear casual, loose fitting, comfortable clothing and leave all valuables, including jewelry, and large sums of cash at home.   Leave your valuables at home. Belongings that remain with you are your  responsibility. [facility] is not liable for loss or damage. If valuables are brought to the hospital, they will be identified and  recorded by our admitting team. All jewelry must be removed and left at home. If rings cannot be removed, we encourage you to have them removed by a jeweler prior to surgery to avoid damage or loss of ring.  [facility] is not responsible of lose or damaged jewelry. This policy protects the patient and prevents the items from being lost or damaged.    Leave contact lenses at home. Wear your eyeglasses and bring a case.   If you develop any illness prior to surgery (fever, cough, sore throat, cold, flu, infection), OR START A NEW MEDICATION, please call your surgeon and the [facility] at [phone].   If you are spending the night you may bring toiletries and sleeping clothes if you desire; otherwise the hospital will provide them for you.    DO NOT bring your medications with you to the hospital unless you were specifically instructed to do so.   DO bring a list of your medications including dose(s) and when you take them.   DO bring TWO forms of ID - including one ID with a photo.    Leaving the Hospital   Please ask your surgeon about your anticipated length of stay.   Please make sure your ride is available to pick you up by 12N on day of discharge   It is recommended that all patients have a responsible person at home the first night after discharge from the hospital.   ALL patients, including same day surgery patients, must arrange for an adult to drive/escort them home upon discharge.  Patients going home the same day of surgery will have their procedure cancelled if these arrangements are not made ahead of time.    Family and Friends  Friends and family may wait in the assigned waiting areas.  Patient care coordinators are available in these patient waiting areas to provide updates regarding patient progress; hospital room assignments; and discharge planning  (for same day surgery).    [facility] OR, [place]. Room #M104J    Guided Imagery  Research has shown that listening to guided imagery is helpful for many health conditions.  [facility] offers these sessions to listen to at the following website: [email or web address].[email or web address]     Please consider guided imagery to prepare for surgery and for coping with stress, sleep and pain.      COVID Screening On-Demand  Screening for COVID is required for all patients and visitors arriving to a [facility] facility. To expedite the screening process, you are welcome to utilize the [facility] [facility] screening on-demand tool. Please follow the link from your smartphone or scan the QR code to open the screening tool on the day of your arrival and answer the screening questions prior to your arrival.     On-demand COVID screening: tiny.Person[employer].co.uk      Temporary Visitor Restrictions During the COVID-19 Emergency    Wed like to provide advance notice of additional protections our hospital has temporarily put in place for the safety of our patients and visitors, including you, your loved ones, and our healthcare providers.     Visitors are restricted from the hospital and our surgical waiting area will be closed to non-patients.   If you are not going home the day of surgery, we ask that your family members/visitors stay at home.   If you are going home the day of your surgery, your family member or person who will drive you home will be unable to accompany you to the  preop / pacu area.  We strongly encourage your family member/driver to practice social distancing in the location they chose to wait.  If the driver lives in [place], we encourage them to return home to wait.       Patients will need to undergo a health screening on arrival and will not be allowed if they have symptoms of illness, including cough, runny nose, sneezing, fever, and sore throat.   Additionally, children under the age of  68 are restricted from the hospital.    Thank you for your cooperation.    [facility], Radiation Oncology Procedure Room Visitor Policy:     Patients are allowed one guest to accompany them to Radiation Oncology but must wait in our main waiting room, [facility] lobby, or [place] (or leave and return for patient pick-up)   Guests are not allowed to be in the procedure suites or patient holding bays   Our staff will be contacting the guest/caregiver regarding patients' status and pick-up information     FAQ     1.     What if I or the person who is scheduled to take me home following my procedure has one of the symptoms you stated?   Please attempt to secure a ride home with a different friend or family member. If thats not possible, please contact your surgeons office immediately to inquire whether or not it is appropriate to reschedule the procedure.     2.     Why are children not allowed?   This is a precaution put in place by our clinical leadership and intended to ensure the safety of our patients. Younger children can carry viruses even when they are not symptomatic.

## 2020-05-24 NOTE — H&P (View-Only) (Signed)
[facility]  Anesthesia Preprocedure Evaluation    Procedure Information     Case: [id number] Date/Time: 05/31/20 0730    Procedure: LAPAROSCOPIC SLEEVE GASTRECTOMY FOR WEIGHT LOSS (N/A Abdomen)    Diagnosis: Severe obesity (CMS code) [E66.01]    Pre-op diagnosis: Severe obesity (CMS code) [E66.01]    Location: [facility] OR 27 / [facility]    Surgeons: Georgia Duff, MD          Precautions          None          Relevant Problems   No relevant active problems         Anesthesia Encounter History        CC/HPI/Past Medical History Summary: 34 yo female w/morbid obesity, s/f Laparoscopic sleeve gastrectomy on 05/31/20 w/Dr. Montez Morita    PMH  - Pt denies cardiopulmonary dz.  BP was elevated over the summer but reportedly is now in 150's/80's.  No meds at this time and will revisit with her PCP after surgery.  - OCD/Anxiety - controlled w/Lexapro    (Please refer to APeX Allergies, Problems, Past Medical History, Past Surgical History, Social History, and Family History activities, Results for current data from these respective sections of the chart; these sections of the chart are also summarized in reports, including the Patient Summary Extracts found in Chart Review)      Summary of Outside Records:    01/02/20 ECG:  IMPRESSION:   Normal sinus rhythm  Normal ECG  No previous ECGs available  Confirmed by LEE, BYRON ([id number]) on 01/02/2020 10:41:48 AM  Summary of Prior Anesthetics: No prior anesthetics  Unaware family history of adverse reactions to anesthesia.      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 No pertinent surgical history.  Allergies/Contraindications   Allergen Reactions    Iodine Hives and Itching     IV Iodine.  Unsure about topical iodine    Shellfish Derived Anaphylaxis    Tree Nuts Anaphylaxis    Morphine Nausea And Vomiting     Current Medications       Dosage    biotin 10,000 mcg TABRAPID Take 1 tablet by mouth    clotrimazole (LOTRIMIN) 1 % cream Apply topically Twice a day Use as instructed    EPINEPHrine (EPIPEN) 0.3 mg/0.3 mL injection Inject 0.3 mL (0.3 mg total) into the muscle once as needed for Anaphylaxis for up to 1 dose Use as instructed    ergocalciferol, vitamin D2, (ERGOCALCIFEROL) 50,000 unit capsule Take 1 capsule (50,000 Units total) by mouth every 7 (seven) days for 112 days Use as instructed    escitalopram oxalate (LEXAPRO)  20 mg tablet Take 1 tablet (20 mg total) by mouth daily    lactobacillus combo no.13 (PROBIOTIC PEARLS COMPLETE ORAL) Take by mouth 1 capsule once daily        Ht 170.2 cm (5' 7.01")   Wt (!) 198.5 kg (437 lb 9.6 oz)   LMP  (LMP Unknown)   BMI 68.52 kg/m          Review of Systems Functional Status: Climb a flight of stairs or walk up a hill (5.50 METs) Lives in 2 story house and goes up/down stairs regularly.  Walks to store.  Denies exertional cp/sob  Constitutional: Negative for activity change, appetite change, chills and fever.   Airway: Positive for other dental problems (chipped teeth.) and snoring. Negative for limited neck movement, difficulty opening mouth, loose teeth, dental hardware, TMJ problem, witnessed apnea and CPAP  HENT: Negative for hearing loss and trouble swallowing.   Eyes: Negative for visual disturbance.   Respiratory: Negative for Recent URI Symptoms and shortness of breath.    Cardiovascular: Negative.  Negative for chest pain.   Gastrointestinal: Negative.   Negative for Negative for GERD symptoms.   Genitourinary: Negative.  Negative for dysuria.        Overactive bladder.  No sx of UTI   Musculoskeletal: Negative.    Neurological: Negative.    Hematological: Does not bruise/bleed easily.   Endocrine/Metabolic:   Endo/Heme/Allergy negative    Psychiatric/Behavioral/Developmental:        OCD/Anxiety - Controlled w/Lexapro       Physical Exam  PC only, needs PE on DOS.  AVS discussed and sent to My Chart.  Sdowd, NP  Prepare (Pre-Operative Clinic) Assessment/Plan/Narrative  Pt denies pregnancy?  Yes.  Not sexually active in at least 1 month  Does patient wish pregnancy test? No, but is agreeable to a test.  Ordered for MOS prn  Would a positive pregnancy test change her decision about surgery?  N/A        Stop [facility] information    Anesthesia Assessment and Plan  Day Of Surgery Provider Chart Review:  NPO status verified  Medications reviewed  Allergies reviewed  Problem list reviewed  Anesthesia history reviewed  Pertinent labs reviewed  Consults reviewed    ASA 3       Anesthesia Plan  Anesthesia Type: general  Induction Technique: IntraVenous  Invasive Monitors/Vascular Access: None  Airway Plan: oral ET tube  Possible Advanced Airway Techniques: None  Other Techniques: None  Planned Recovery Location: PACU    Blood Product PreparationBlood Products Plan: Reviewed peri-op blood report    Quality Measure Documentation   Opioid Therapy Planned? Yes    (See Anesthesia Record for attending attestation)    [Please note, smart link data included in this note may not reflect changes since note creation. Please see appropriate section of APeX for up-to-the minute information.]

## 2020-05-27 ENCOUNTER — Ambulatory Visit
Admit: 2020-05-27 | Discharge: 2020-05-27 | Payer: MEDICAID | Primary: Student in an Organized Health Care Education/Training Program

## 2020-05-27 DIAGNOSIS — Z6841 Body Mass Index (BMI) 40.0 and over, adult: Secondary | ICD-10-CM

## 2020-05-28 LAB — COVID-19 RNA, RT-PCR/NUCLEIC A: COVID-19 RNA, RT-PCR/Nucleic A: NOT DETECTED

## 2020-05-31 ENCOUNTER — Inpatient Hospital Stay
Admit: 2020-05-31 | Discharge: 2020-06-04 | Disposition: A | Discharge status: Home / Self Care | Payer: MEDICAID | Attending: Physician | Admitting: Physician

## 2020-05-31 DIAGNOSIS — F17200 Nicotine dependence, unspecified, uncomplicated: Secondary | ICD-10-CM

## 2020-05-31 DIAGNOSIS — F4310 Post-traumatic stress disorder, unspecified: Secondary | ICD-10-CM

## 2020-05-31 DIAGNOSIS — R030 Elevated blood-pressure reading, without diagnosis of hypertension: Secondary | ICD-10-CM

## 2020-05-31 DIAGNOSIS — I951 Orthostatic hypotension: Secondary | ICD-10-CM

## 2020-05-31 DIAGNOSIS — R710 Precipitous drop in hematocrit: Secondary | ICD-10-CM

## 2020-05-31 DIAGNOSIS — F419 Anxiety disorder, unspecified: Secondary | ICD-10-CM

## 2020-05-31 DIAGNOSIS — R000 Tachycardia, unspecified: Secondary | ICD-10-CM

## 2020-05-31 DIAGNOSIS — Z6841 Body Mass Index (BMI) 40.0 and over, adult: Secondary | ICD-10-CM

## 2020-05-31 DIAGNOSIS — N393 Stress incontinence (female) (male): Secondary | ICD-10-CM

## 2020-05-31 DIAGNOSIS — K760 Fatty (change of) liver, not elsewhere classified: Secondary | ICD-10-CM

## 2020-05-31 DIAGNOSIS — D62 Acute posthemorrhagic anemia: Secondary | ICD-10-CM

## 2020-05-31 DIAGNOSIS — Z87891 Personal history of nicotine dependence: Secondary | ICD-10-CM

## 2020-05-31 DIAGNOSIS — L409 Psoriasis, unspecified: Secondary | ICD-10-CM

## 2020-05-31 DIAGNOSIS — N926 Irregular menstruation, unspecified: Secondary | ICD-10-CM

## 2020-05-31 DIAGNOSIS — E559 Vitamin D deficiency, unspecified: Secondary | ICD-10-CM

## 2020-05-31 DIAGNOSIS — K91870 Postprocedural hematoma of a digestive system organ or structure following a digestive system procedure: Secondary | ICD-10-CM

## 2020-05-31 DIAGNOSIS — F32A Depression, unspecified: Secondary | ICD-10-CM

## 2020-05-31 DIAGNOSIS — I8393 Asymptomatic varicose veins of bilateral lower extremities: Secondary | ICD-10-CM

## 2020-05-31 DIAGNOSIS — Z20822 Contact with and (suspected) exposure to covid-19: Secondary | ICD-10-CM

## 2020-05-31 LAB — COMPLETE BLOOD COUNT
Hematocrit: 36.6 % (ref 36.0–46.0)
Hematocrit: 38.9 % (ref 36.0–46.0)
Hemoglobin: 11.6 g/dL — ABNORMAL LOW (ref 12.0–15.5)
Hemoglobin: 12.3 g/dL (ref 12.0–15.5)
MCH: 29.1 pg (ref 26.0–34.0)
MCH: 28.9 pg (ref 26.0–34.0)
MCHC: 31.7 g/dL (ref 31.0–36.0)
MCHC: 31.6 g/dL (ref 31.0–36.0)
MCV: 92 fL (ref 80–100)
MCV: 91 fL (ref 80–100)
Platelet Count: 286 10*9/L (ref 140–450)
Platelet Count: 320 10*9/L (ref 140–450)
RBC Count: 3.98 10*12/L — ABNORMAL LOW (ref 4.00–5.20)
RBC Count: 4.26 10*12/L (ref 4.00–5.20)
WBC Count: 13.1 10*9/L — ABNORMAL HIGH (ref 3.4–10.0)
WBC Count: 15.9 10*9/L — ABNORMAL HIGH (ref 3.4–10.0)

## 2020-05-31 LAB — POCT URINE PREGNANCY, >= 18 YE: POCT Preg Test, Ur: NEGATIVE

## 2020-05-31 MED ORDER — CELECOXIB 200 MG CAPSULE
200 | Freq: Two times a day (BID) | ORAL | Status: DC
Start: 2020-05-31 — End: 2020-06-01
  Administered 2020-05-31 – 2020-06-01 (×2): 200 mg via ORAL

## 2020-05-31 MED ORDER — BUPIVACAINE (PF) 0.25 % (2.5 MG/ML) INJECTION SOLUTION
2.5 | INTRAMUSCULAR | Status: AC
Start: 2020-05-31 — End: ?

## 2020-05-31 MED ORDER — CEFAZOLIN 1 GRAM SOLUTION FOR INJECTION
1 | INTRAMUSCULAR | Status: DC | PRN
Start: 2020-05-31 — End: 2020-05-31
  Administered 2020-05-31: 15:00:00 3000 via INTRAVENOUS

## 2020-05-31 MED ORDER — SCOPOLAMINE 1 MG OVER 3 DAYS TRANSDERMAL PATCH
1 | TRANSDERMAL | Status: DC
Start: 2020-05-31 — End: 2020-06-04
  Administered 2020-06-03: 20:00:00 via TRANSDERMAL

## 2020-05-31 MED ORDER — ONDANSETRON HCL (PF) 4 MG/2 ML INJECTION SOLUTION
4 | INTRAMUSCULAR | Status: DC | PRN
Start: 2020-05-31 — End: 2020-05-31
  Administered 2020-05-31: 17:00:00 4 via INTRAVENOUS

## 2020-05-31 MED ORDER — LACTATED RINGERS IV BOLUS
INTRAVENOUS | Status: AC
  Administered 2020-06-01: 07:00:00 500 mL via INTRAVENOUS

## 2020-05-31 MED ORDER — POLYETHYLENE GLYCOL 3350 17 GRAM ORAL POWDER PACKET
17 | Freq: Every day | ORAL | Status: DC | PRN
Start: 2020-05-31 — End: 2020-06-04

## 2020-05-31 MED ORDER — MAGNESIUM SULFATE 1 GRAM/100 ML IN DEXTROSE 5 % INTRAVENOUS PIGGYBACK
1 | INTRAVENOUS | Status: DC | PRN
Start: 2020-05-31 — End: 2020-05-31
  Administered 2020-05-31: 15:00:00 2 via INTRAVENOUS

## 2020-05-31 MED ORDER — BUPIVACAINE (PF) 0.25 % (2.5 MG/ML) INJECTION SOLUTION
2.5 | INTRAMUSCULAR | Status: DC | PRN
Start: 2020-05-31 — End: 2020-05-31
  Administered 2020-05-31: 17:00:00 30 via INTRAMUSCULAR

## 2020-05-31 MED ORDER — HEPARIN, PORCINE (PF) 5,000 UNIT/0.5 ML INJECTION SOLUTION
5000 | Freq: Three times a day (TID) | INTRAMUSCULAR | Status: DC
Start: 2020-05-31 — End: 2020-06-01
  Administered 2020-05-31 – 2020-06-01 (×3): 5000 [IU] via SUBCUTANEOUS

## 2020-05-31 MED ORDER — PROPOFOL 10 MG/ML INTRAVENOUS EMULSION
10 | INTRAVENOUS | Status: DC | PRN
Start: 2020-05-31 — End: 2020-05-31
  Administered 2020-05-31 (×2): 30 via INTRAVENOUS
  Administered 2020-05-31: 15:00:00 300 via INTRAVENOUS
  Administered 2020-05-31 (×2): 30 via INTRAVENOUS
  Administered 2020-05-31: 17:00:00 20 via INTRAVENOUS
  Administered 2020-05-31: 15:00:00 30 via INTRAVENOUS
  Administered 2020-05-31: 15:00:00 50 via INTRAVENOUS

## 2020-05-31 MED ORDER — LIDOCAINE (PF) 10 MG/ML (1 %) INJECTION SOLUTION
10 | Freq: Once | INTRAMUSCULAR | Status: DC | PRN
Start: 2020-05-31 — End: 2020-05-31

## 2020-05-31 MED ORDER — FENTANYL (PF) 50 MCG/ML INJECTION SOLUTION
50 | INTRAMUSCULAR | Status: DC | PRN
Start: 2020-05-31 — End: 2020-05-31
  Administered 2020-05-31: 17:00:00 50 via INTRAVENOUS
  Administered 2020-05-31 (×2): 25 via INTRAVENOUS
  Administered 2020-05-31: 15:00:00 50 via INTRAVENOUS
  Administered 2020-05-31: 15:00:00 100 via INTRAVENOUS

## 2020-05-31 MED ORDER — ONDANSETRON HCL (PF) 4 MG/2 ML INJECTION SOLUTION
4 | Freq: Four times a day (QID) | INTRAMUSCULAR | Status: DC | PRN
Start: 2020-05-31 — End: 2020-06-04
  Administered 2020-05-31: 20:00:00 4 mg via INTRAVENOUS

## 2020-05-31 MED ORDER — DEXMEDETOMIDINE 100 MCG/ML INTRAVENOUS SOLUTION
100 | INTRAVENOUS | Status: DC | PRN
Start: 2020-05-31 — End: 2020-05-31
  Administered 2020-05-31: 17:00:00 4 via INTRAVENOUS
  Administered 2020-05-31 (×2): 8 via INTRAVENOUS

## 2020-05-31 MED ORDER — LIDOCAINE (PF) 100 MG/5 ML (2 %) INTRAVENOUS SYRINGE
100 | INTRAVENOUS | Status: DC | PRN
Start: 2020-05-31 — End: 2020-05-31
  Administered 2020-05-31: 15:00:00 100 via INTRAVENOUS

## 2020-05-31 MED ORDER — BISACODYL 10 MG RECTAL SUPPOSITORY
10 | Freq: Every day | RECTAL | Status: DC | PRN
Start: 2020-05-31 — End: 2020-06-04

## 2020-05-31 MED ORDER — HEPARIN, PORCINE (PF) 5,000 UNIT/0.5 ML INJECTION SOLUTION
5000 | INTRAMUSCULAR | Status: DC | PRN
Start: 2020-05-31 — End: 2020-05-31
  Administered 2020-05-31: 15:00:00 5000 via SUBCUTANEOUS

## 2020-05-31 MED ORDER — KETAMINE 50 MG/ML INJECTION SOLUTION
50 | INTRAMUSCULAR | Status: DC | PRN
Start: 2020-05-31 — End: 2020-05-31
  Administered 2020-05-31: 15:00:00 75 via INTRAVENOUS

## 2020-05-31 MED ORDER — HEPARIN, PORCINE (PF) 5,000 UNIT/0.5 ML INJECTION SOLUTION
5000 | INTRAMUSCULAR | Status: AC
Start: 2020-05-31 — End: ?

## 2020-05-31 MED ORDER — ROCURONIUM 10 MG/ML INTRAVENOUS SOLUTION
10 | INTRAVENOUS | Status: DC | PRN
Start: 2020-05-31 — End: 2020-05-31
  Administered 2020-05-31: 15:00:00 10 via INTRAVENOUS

## 2020-05-31 MED ORDER — FENTANYL (PF) 50 MCG/ML INJECTION SOLUTION
50 | INTRAMUSCULAR | Status: DC | PRN
Start: 2020-05-31 — End: 2020-05-31
  Administered 2020-05-31 (×2): 25 ug via INTRAVENOUS
  Administered 2020-05-31 (×2): 50 ug via INTRAVENOUS

## 2020-05-31 MED ORDER — SUGAMMADEX 100 MG/ML INTRAVENOUS SOLUTION
100 | INTRAVENOUS | Status: DC | PRN
Start: 2020-05-31 — End: 2020-05-31
  Administered 2020-05-31: 17:00:00 500 via INTRAVENOUS

## 2020-05-31 MED ORDER — SCOPOLAMINE 1 MG OVER 3 DAYS TRANSDERMAL PATCH
1 | TRANSDERMAL | Status: AC
Start: 2020-05-31 — End: 2020-06-03
  Administered 2020-05-31: 14:00:00 via TRANSDERMAL

## 2020-05-31 MED ORDER — ONDANSETRON HCL 4 MG TABLET
4 | Freq: Four times a day (QID) | ORAL | Status: DC | PRN
Start: 2020-05-31 — End: 2020-06-04

## 2020-05-31 MED ORDER — HYDROMORPHONE (PF) 0.5 MG/0.5 ML INJECTION SYRINGE
0.5 | Freq: Once | INTRAMUSCULAR | Status: AC
Start: 2020-05-31 — End: 2020-05-31
  Administered 2020-06-01: 01:00:00 0.2 mg via INTRAVENOUS

## 2020-05-31 MED ORDER — SENNOSIDES 8.6 MG TABLET
8.6 | Freq: Every day | ORAL | Status: DC
Start: 2020-05-31 — End: 2020-06-04

## 2020-05-31 MED ORDER — LIDOCAINE 5 % TOPICAL PATCH
5 | Freq: Every day | TOPICAL | Status: DC
Start: 2020-05-31 — End: 2020-06-04
  Administered 2020-05-31 – 2020-06-04 (×5): via TOPICAL

## 2020-05-31 MED ORDER — ACETAMINOPHEN 500 MG TABLET
500 mg | ORAL | Status: DC
  Administered 2020-06-01 – 2020-06-04 (×7): 1000 mg via ORAL

## 2020-05-31 MED ORDER — LACTATED RINGERS IV BOLUS
INTRAVENOUS | Status: AC
  Administered 2020-06-01: 04:00:00 250 mL via INTRAVENOUS

## 2020-05-31 MED ORDER — ACETAMINOPHEN 1,000 MG/100 ML (10 MG/ML) INTRAVENOUS SOLUTION (ONCE)
1000 | Freq: Once | INTRAVENOUS | Status: AC
Start: 2020-05-31 — End: 2020-05-31
  Administered 2020-05-31: 16:00:00 1000 mg via INTRAVENOUS

## 2020-05-31 MED ORDER — MIDAZOLAM 1 MG/ML INJECTION SOLUTION
1 | INTRAMUSCULAR | Status: DC | PRN
Start: 2020-05-31 — End: 2020-05-31
  Administered 2020-05-31 (×2): 1 via INTRAVENOUS

## 2020-05-31 MED ORDER — MENTHOL 5.8 MG LOZENGES
5.8 | Status: DC | PRN
Start: 2020-05-31 — End: 2020-05-31

## 2020-05-31 MED ORDER — LABETALOL 5 MG/ML INTRAVENOUS SOLUTION
5 | INTRAVENOUS | Status: DC | PRN
Start: 2020-05-31 — End: 2020-05-31
  Administered 2020-05-31: 16:00:00 10 via INTRAVENOUS

## 2020-05-31 MED ORDER — SODIUM CHLORIDE 0.9 % IRRIGATION SOLUTION
0.9 | Status: DC | PRN
Start: 2020-05-31 — End: 2020-05-31

## 2020-05-31 MED ORDER — ESCITALOPRAM 10 MG TABLET
10 | Freq: Every morning | ORAL | Status: DC
Start: 2020-05-31 — End: 2020-06-04
  Administered 2020-05-31 – 2020-06-04 (×5): 20 mg via ORAL

## 2020-05-31 MED ORDER — SODIUM CHLORIDE 0.9 % (FLUSH) INJECTION SYRINGE
0.9 | INTRAMUSCULAR | Status: DC | PRN
Start: 2020-05-31 — End: 2020-06-04

## 2020-05-31 MED ORDER — LACTATED RINGERS INTRAVENOUS SOLUTION
INTRAVENOUS | Status: DC
Start: 2020-05-31 — End: 2020-05-31
  Administered 2020-05-31: 14:00:00 30 mL/h via INTRAVENOUS

## 2020-05-31 MED ORDER — LACTATED RINGERS IV BOLUS
INTRAVENOUS | Status: AC
  Administered 2020-06-01: 05:00:00 250 mL via INTRAVENOUS

## 2020-05-31 MED ORDER — SUCCINYLCHOLINE(PF)200 MG/10 ML(20 MG/ML)-NACL,ISO INTRAVENOUS SYRINGE
200 | INTRAVENOUS | Status: DC | PRN
Start: 2020-05-31 — End: 2020-05-31
  Administered 2020-05-31: 15:00:00 160 via INTRAVENOUS

## 2020-05-31 MED ORDER — OXYCODONE 5 MG TABLET
5 | ORAL | Status: DC | PRN
Start: 2020-05-31 — End: 2020-06-04
  Administered 2020-05-31 – 2020-06-04 (×11): 5 mg via ORAL

## 2020-05-31 MED ORDER — LANSOPRAZOLE 30 MG DELAYED RELEASE,DISINTEGRATING TABLET
30 | Freq: Every morning | ORAL | Status: DC
Start: 2020-05-31 — End: 2020-06-01
  Administered 2020-05-31 – 2020-06-01 (×2): 30 mg via ORAL

## 2020-05-31 MED ORDER — GABAPENTIN 300 MG CAPSULE
300 | Freq: Every day | ORAL | Status: DC
Start: 2020-05-31 — End: 2020-06-04
  Administered 2020-06-02: 04:00:00 300 mg via ORAL
  Administered 2020-06-03 – 2020-06-04 (×2): 600 mg via ORAL

## 2020-05-31 MED ORDER — SODIUM CHLORIDE 0.9 % (FLUSH) INJECTION SYRINGE
0.9 | Freq: Three times a day (TID) | INTRAMUSCULAR | Status: DC
Start: 2020-05-31 — End: 2020-06-04
  Administered 2020-05-31 – 2020-06-04 (×11): 3 mL via INTRAVENOUS

## 2020-05-31 MED ORDER — DEXTROSE 5 % AND LACTATED RINGERS INTRAVENOUS SOLUTION
INTRAVENOUS | Status: AC
Start: 2020-05-31 — End: 2020-06-02
  Administered 2020-05-31 – 2020-06-02 (×4): 100 mL/h via INTRAVENOUS

## 2020-05-31 MED ORDER — HYDROGEN PEROXIDE 3 % SOLUTION
3 | Status: DC | PRN
Start: 2020-05-31 — End: 2020-05-31
  Administered 2020-05-31: 17:00:00 10 via TOPICAL

## 2020-05-31 MED ORDER — PROCHLORPERAZINE EDISYLATE 10 MG/2 ML (5 MG/ML) INJECTION SOLUTION
10 | Freq: Four times a day (QID) | INTRAMUSCULAR | Status: DC | PRN
Start: 2020-05-31 — End: 2020-05-31
  Administered 2020-05-31: 17:00:00 5 mg via INTRAVENOUS

## 2020-05-31 MED ORDER — SCOPOLAMINE 1 MG OVER 3 DAYS TRANSDERMAL PATCH
1 | TRANSDERMAL | Status: DC
Start: 2020-05-31 — End: 2020-05-31

## 2020-05-31 MED ORDER — HYDROMORPHONE (PF) 0.5 MG/0.5 ML INJECTION SYRINGE
0.5 | INTRAMUSCULAR | Status: DC | PRN
Start: 2020-05-31 — End: 2020-05-31
  Administered 2020-05-31 (×3): 0.5 mg via INTRAVENOUS

## 2020-05-31 MED ORDER — ACETAMINOPHEN 500 MG TABLET
500 | Freq: Four times a day (QID) | ORAL | Status: DC
Start: 2020-05-31 — End: 2020-05-31
  Administered 2020-05-31 – 2020-06-01 (×2): 1000 mg via ORAL

## 2020-05-31 MED FILL — DILAUDID (PF) 0.5 MG/0.5 ML INJECTION SYRINGE: 0.5 mg/ mL | INTRAMUSCULAR | Qty: 0.5

## 2020-05-31 MED FILL — HEPARIN, PORCINE (PF) 5,000 UNIT/0.5 ML INJECTION SOLUTION: 5000 unit/0.5 mL | INTRAMUSCULAR | Qty: 0.5

## 2020-05-31 MED FILL — BUPIVACAINE (PF) 0.25 % (2.5 MG/ML) INJECTION SOLUTION: 2.5 mg/mL (0.25 %) | INTRAMUSCULAR | Qty: 90

## 2020-05-31 MED FILL — OFIRMEV 1,000 MG/100 ML (10 MG/ML) INTRAVENOUS SOLUTION: 1000 mg/100 mL (10 mg/mL) | INTRAVENOUS | Qty: 100

## 2020-05-31 MED FILL — ESCITALOPRAM 10 MG TABLET: 10 mg | ORAL | Qty: 2

## 2020-05-31 MED FILL — PROCHLORPERAZINE EDISYLATE 10 MG/2 ML (5 MG/ML) INJECTION SOLUTION: 10 mg/2 mL (5 mg/mL) | INTRAMUSCULAR | Qty: 2

## 2020-05-31 MED FILL — LANSOPRAZOLE 30 MG DELAYED RELEASE,DISINTEGRATING TABLET: 30 mg | ORAL | Qty: 1

## 2020-05-31 MED FILL — LIDOCAINE 5 % TOPICAL PATCH: 5 % | TOPICAL | Qty: 1

## 2020-05-31 MED FILL — SCOPOLAMINE 1 MG OVER 3 DAYS TRANSDERMAL PATCH: 1 mg over 3 days | TRANSDERMAL | Qty: 1

## 2020-05-31 MED FILL — OXYCODONE 5 MG TABLET: 5 mg | ORAL | Qty: 1

## 2020-05-31 MED FILL — ACETAMINOPHEN 500 MG TABLET: 500 mg | ORAL | Qty: 2

## 2020-05-31 MED FILL — CELECOXIB 200 MG CAPSULE: 200 mg | ORAL | Qty: 1

## 2020-05-31 MED FILL — ONDANSETRON HCL (PF) 4 MG/2 ML INJECTION SOLUTION: 4 mg/2 mL | INTRAMUSCULAR | Qty: 2

## 2020-05-31 MED FILL — HEPARIN, PORCINE (PF) 5,000 UNIT/0.5 ML INJECTION SOLUTION: 5000 unit/0.5 mL | INTRAMUSCULAR | Qty: 1.5

## 2020-05-31 MED FILL — FENTANYL (PF) 50 MCG/ML INJECTION SOLUTION: 50 mcg/mL | INTRAMUSCULAR | Qty: 2

## 2020-05-31 NOTE — Plan of Care (Signed)
Problem: Pain,  Acute / Chronic- Adult  Goal: Control of acute pain  Outcome: Progress within 12 hours  Goal: Involvement in pain management plan  Outcome: Progress within 12 hours     Problem: Fall, at Risk or Actual - Adult  Goal: Absence of falls and fall related injury  Outcome: Progress within 12 hours  Goal: Knowledge of fall prevention  Outcome: Progress within 12 hours

## 2020-05-31 NOTE — Plan of Care (Signed)
Problem: Anxiety - Perianesthesia Care  Goal: Minimize anxiety  05/31/2020 1355 by Inez Catalina, RN  Outcome: Progress within 12 hours  05/31/2020 1354 by Inez Catalina, RN  Outcome: Progress within 12 hours     Problem: Hemodynamic Instability - Perianesthesia Care  Goal: Maintain / promote hemodynamic stability  05/31/2020 1355 by Inez Catalina, RN  Outcome: Progress within 12 hours  05/31/2020 1354 by Inez Catalina, RN  Outcome: Progress within 12 hours     Problem: Discharge Planning - Adult  Goal: Knowledge of and participation in plan of care  05/31/2020 1355 by Inez Catalina, RN  Outcome: Progress within 12 hours  05/31/2020 1354 by Inez Catalina, RN  Outcome: Progress within 12 hours

## 2020-05-31 NOTE — Progress Notes (Addendum)
SURGERY POST-OP /PROGRESS NOTE     Problem-based Assessment & Plan  Kayla Frey is a 34 y.o. female with a history of anxiety, depression, trauma in childhood, and morbid obesity with a BMI of 68. She was taken to the OR on 05/31/2020 for elective laparoscopic sleeve gastrectomy with Dr. Montez Morita. The patient tolerated the procedure well and there were no complications. Immediately after the procedure, the patient was extubated and the foley was removed. The patient recovered in the PACU and was transferred to the acute care floor.     ##s/p lap sleeve gastrectomy    ##BMI 60.0-69.9, adult (CMS code)  - 6 hr CBC pending.  - Stage I bariatric diet w/ LR at 100 mL/hr  - All tablets crushed. Capsules okay.  - Continue PO APAP, gabapentin, Celebrex, lidocaine patch, and crushed oxycodone. Avoid IV narcotics.  - Scopolamine patch, PRN Zofran  - Lansoprazole  - CPO.  - Encourage incentive spirometry  - Lung expansion pathway  - Encourage ambulation  - SCDs  - SQH  - Extended DVT ppx on DC. NO aspirin.  - Penrose drain to be removed on POD 2.    ##PTSD  ##Anxiety disorder  - Continue lexapro   - Female staff only for personal care    ##Irregular menstrual cycle  - Cont OCPs      24 Hour Course  OR. Voided.    Subjective  Epigastic pain tolerable. No nausea. Glad surgery is over.    Vitals  Temp:  [36 C (96.8 F)] 36 C (96.8 F)  Heart Rate:  [74] 74  BP: (145)/(84) 145/84  SpO2:  [98 %] 98 %    Input / Output  I/O last 2 completed shifts plus current shift:  In: 1200 [I.V.:1000; IV Piggyback:200]  Out: 500 [Urine:500]    Physical Exam  Physical Exam  Constitutional:       General: She is not in acute distress.     Appearance: Normal appearance. She is obese.   HENT:      Head: Normocephalic.      Right Ear: External ear normal.      Left Ear: External ear normal.   Cardiovascular:      Pulses: Normal pulses.   Pulmonary:      Effort: Pulmonary effort is normal. No respiratory distress.   Abdominal:      General: There is  no distension.      Palpations: Abdomen is soft.      Tenderness: There is abdominal tenderness. There is no guarding.      Comments: Laparoscopic incisions glued and open to air. No surrounding erythema, increased warmth, or drainage. Obese, non distended abdomen appropriately tender to palpation.      Musculoskeletal:         General: Normal range of motion.   Skin:     General: Skin is dry.      Capillary Refill: Capillary refill takes less than 2 seconds.   Neurological:      General: No focal deficit present.      Mental Status: She is alert and oriented to person, place, and time.   Psychiatric:         Behavior: Behavior normal.         Data  Recent Labs     05/31/20  1011   WBC 13.1*   HGB 11.6*   HCT 36.6   PLT 286         Brief Operative Note  Surgeon(s) and Role:     * Georgia Duff, MD - Primary     * Roe Rutherford, MD - Fellow - Assisting    Date of Operation: 06-01-20    Pre-Op Diagnosis Codes:     * Severe obesity (CMS code) [E66.01]    Post-Op Diagnosis Codes:     * Severe obesity (CMS code) [E66.01]    Procedure(s) and Anesthesia Type:     * LAPAROSCOPIC SLEEVE GASTRECTOMY FOR WEIGHT LOSS - Anes-General    Implants:   Implant Name Type Inv. Item Serial No. Manufacturer Lot No. LRB No. Used Action   [employer]   VST10 - [id number] Other [employer]   VST10 [id number]  [id number] N/A 1 Implanted        ID Type Source Tests Collected by Time Destination   1 : Greater Curvature of the Stomach Other Other(specify) SURGICAL PATHOLOGY Georgia Duff, MD June 01, 2020 [phone]        Findings: fatty liver    Drains: none    Complications: none    Disposition and Plan: admit to RNF    Estimated Blood Loss: 20 mL           During this hospitalization the patient is also being treated for:   N/A    Code Status: FULL    Adelina Mings MSN, FNP-BC  Nurse Practitioner  [facility]      June 01, 2020

## 2020-05-31 NOTE — Discharge Instructions - Pharmacy (Addendum)
Your home medications:    CONTINUE the following medications as you normally take them:   Clotrimazole (Lotrimin) cream   Epinephrine (Epipen) injection as needed   Biotin 100,000 rapid dissolve tablet   Ergocalciferol 50,000 international units capsule once weekly    CONTINUE the following medications, but CRUSH all tablets before taking them for 3 months:   Escitalopram (Lexapro) tablet    NEW Medications to start on Discharge:  Prescription:  1. Ursodiol 300mg  capsules:   ? 1 capsule by mouth twice daily for 6 months   ? To prevent gallstones  2. Omeprazole 20mg  capsules:   ? 1 capsule by mouth twice daily  ? To prevent stomach ulcers  3. Gabapentin capsule 300mg    ? 2 capsules (600mg ) by mouth nightly for 7 days  ? For pain  4. Acetaminophen (Tylenol) 500mg  tablet extra strength  ? CRUSH 2 tablets (1000mg ) take by mouth three times daily  ? For pain, take around the clock initially then transition to as needed for mild pain  ? Never take more than 4000mg  per day (maximum daily amount)  5. Pain medication: Oxycodone 5mg  tablet                     - CRUSH 1 tablet (5mg ) take by mouth every four hours as needed                      - For pain not controlled by Tylenol    Over-the-counter:   1. ProCare Bariatric Complete Multivitamin capsules:  ? Take 1 capsule by mouth daily at bedtime for life  ? No gummy vitamins. They do not contain all the nutrients you need  ? Take separately from calcium         2. Celebrate Calcium Citrate 500mg  + Vitamin D 500unit softchews:   ? Take 1 softchew by mouth twice daily with breakfast and dinner for life  ? If you do not eat a diet rich in calcium, take one additional softchew with lunch daily  ? Take separately from multivitamin      Additional Comments  -------------------------------  - Contraception: use a back-up barrier method (condoms) to prevent pregnancy, recommended to prevent pregnancy for at least one year after bariatric surgery   -- You will receive a  46-month supply of our preferred nutritional supplement regimen (ProCare Bariatric multivitamin + Celebrate Calcium Citrate/Vitamin D soft chew). Please be sure to purchase more before you run out. Note that the ProCare Bariatric Complete Multivitamin is currently available for purchase online at [email or web address] and sometimes .   Note that the Celebrate Calcium Citrate with Vitamin D Soft Chew is currently available for purchase online at [email or web address] and sometimes . If you are unable to obtain our preferred regimen, please refer to our alternate regimen in your dietary information packet.  -- To access refills of your medications, you may go to any [facility]. If you would like to pick up your refills at a non-[facility] pharmacy, simply ask your preferred pharmacy to transfer your prescriptions from [facility] at [address] [facility] [facility].    Post Bariatric Surgery Medication Education  1. CRUSH all tablets for 3 months after surgery. May take capsules whole after surgery or OPEN capsules for 3 months after surgery. Please consult with outpatient pharmacist or clinic if starting a new medication during this time to make sure it can be crushed or opened.   2. PERMANENTLY discontinue use of all "NSAIDS" (Non-Steroidal Anti-Inflammatory  Drugs), including aleve, motrin, ibuprofen, and naproxen. Avoid aspirin unless approved by your surgeon. TYLENOL (acetaminophen) is okay to take for pain.  3. PERMANENTLY discontinue use of all sleep aid medications (ex. benadryl, ambien) and benzodiazepines (ex. ativan, xanax, valium).

## 2020-05-31 NOTE — Brief Op Note (Signed)
Brief Operative Note    Surgeon(s) and Role:     * Georgia Duff, MD - Primary     * Roe Rutherford, MD - Fellow - Assisting    Date of Operation: 06-25-2020    Pre-Op Diagnosis Codes:     * Severe obesity (CMS code) [E66.01]    Post-Op Diagnosis Codes:     * Severe obesity (CMS code) [E66.01]    Procedure(s) and Anesthesia Type:     * LAPAROSCOPIC SLEEVE GASTRECTOMY FOR WEIGHT LOSS - Anes-General    Implants:   Implant Name Type Inv. Item Serial No. Manufacturer Lot No. LRB No. Used Action   SEALANT [employer] H[employer] VISTASEAL   VST10 - [id number] Other [employer] H[employer]IST[employer]   VST10 [id number]  [id number] N/A 1 Implanted        ID Type Source Tests Collected by Time Destination   1 : Greater Curvature of the Stomach Other Other(specify) SURGICAL PATHOLOGY Georgia Duff, MD Jun 25, 2020 [phone]           Findings: fatty liver    Drains: none    Complications: none    Disposition and Plan: admit to [facility]    Estimated Blood Loss: 20 mL      Was VTE prophylaxis ordered? Yes  Start time: Within 8 hours       Were NSAIDS ordered? Yes

## 2020-05-31 NOTE — Op Note (Signed)
Operative Report - [facility]    Surgeon(s) and Role:     * Georgia Duff, MD - Primary     * Roe Rutherford, MD - Fellow - Assisting    Date of Operation: 05/31/2020    PREOPERATIVE DIAGNOSIS:  Severe obesity.     POSTOPERATIVE DIAGNOSIS:  Severe obesity.     OPERATION:   1. Laparoscopic sleeve gastrectomy.  2. Bilateral rectus sheath block with Marcaine     ANESTHESIA:  General endotracheal     CLINICAL INDICATIONS:  This patient presented with severe obesity with Body mass index is 68.87 kg/m.    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      FINDINGS:  The sleeve was created and sized using a 40-French ViSiGi bougie.  The sleeve started 6 cm proximal to the pylorus.       DETAILED DESCRIPTION OF PROCEDURE:    The patient was taken to the operative suite and placed in the supine position and a general anesthetic administered.  Both arms were placed on arm boards.  All pressure points were carefully assessed and padded.  Abdominal hair was clipped as needed. The patient's abdomen was then sterilely prepped and draped in the usual fashion.  SCDs were placed and turned on. Heparin was given subcutaneously for DVT prophylaxis.  Appropriate antibiotics were given intravenously for wound prophylaxis.  A time-out was performed.     We gained entry into the abdominal cavity using a Veres needle at Palmer's point.  The Veres needle was inserted so that the tip was just within the abdominal cavity.  The abdomen was then insufflated to a pressure of 15 mmHg.     We next made a transverse incision just above and to the left of the patient's umbilicus measuring 11 mm in length.  Using an optical  trocar and a 0 degree scope, we inserted the trocar into the insufflated abdomen under direct vision.  We then traded out our gas supply to that port.  We traded out our scope for a 10-30 scope.  We then inserted it and looked at the abdomen.     I first looked at the Veres needle entry site.  The Veres was removed. There was no significant injury to the underlying viscera.  We next looked at the abdominal cavity.  The visualized portions of the liver, colon, and small bowel appeared normal, as did the stomach.     We next placed our working ports.  We made a a 5 mm subxyphoid incision, followed by a 5 mm and 15 mm port in the right paramedian location centered 10 and 20 cm from the xiphoid.  We placed another 5 mm port in the left mid abdomen and a 5 mm port in the left subcostal space.  All these were placed under direct vision.     We inserted a toothed grasper through the subxiphoid port and used it to retract the left lateral segment of the liver off the anterior surface of the stomach by grasping the diaphragm just anterior to  the gastroesophageal juction.  This provided excellent exposure of the lesser curve and the anterior wall of the stomach.  We then passed the ViSiGi suction/bougie device.  It was positioned along the lesser curve and suction applied.     We measured a point 6 cm proximal to the pylorus and we marked the greater curvature at this point.  Then we gained entry into the lesser sac using the 40mm LigaSure.  We walked around the greater curvature of the stomach, taking down the terminal branches of the right gastroepiploic vessels and then the short gastric vessels until we completely freed the stomach fundus away from the spleen.  We also dissected the fundus back to the left crus of the diaphragm, so that we could see the phrenoesophageal ligament.  The posterior attachments were then taken down all the way to the lesser curvature, dividing any posterior gastric vessels that were present.   This completely mobilized the stomach.       Starting 6 cm proximal to the pylorus, we next sleeved the stomach using first an endoGIA 18mm black (5-4-86mm) load, and then sequential fires of the EndoGIA 45 or 60 black stapler.  We did this using the bougie as a guide but we took care not to stretch the stomach tightly over the bougie, particularly at the incisura.  We took this up all the way to the His angle, staying just off the fat pad and His angle at the phenoesophageal junction.  All the staples were inspected after each firing and well-formed.     Once freed, we took the greater curvature of the stomach and placed it into a 15 mm Endocatch bag.  It was then pulled out through the 15 mm port site and sent to the pathologist.    Next, we suctioned along the staple line and bleeding points were cauterized as needed. Our next step was to remove the ViSiGi.      We next performed omentopexy. We reattached the sleeved stomach to the cut edge of the gastrocolic omentum with interrupted 2-0 polysorb sutures. We next used fibrin glue and squirted along the staple line for hemostasis.     The fascia at the 15 and 11 mm port sites was closed with interrupted #1 Vicryl sutures.     I next performed a bilateral rectus sheath block with Marcaine.  This was done by passing a spinal needle around the circumference of the closed 52mm and 2mm port sites and instilling local anesthetic into the rectus sheath under direct vision all along the periphery of the closed fascia.    The abdomen was desufflated and the remaining ports removed.  The wound edges were then washed out.  Bleeding points were cauterized.  The skin edges were closed with 4-0 Biosyn.  Indermil was applied as a sterile dressing.     The patient tolerated the procedure well, was extubated in the operating room, and taken to the recovery room in stable condition.  Sponge, needle, and instrument counts were correct at the end of the case.    Estimated Blood Loss:  minimal    Pathology: greater curvature of stomach    Condition at end of operation: stable    Disposition and Plan: care per standard bariatric pathway.    The primary surgeon (listed above) was scrubbed and present throughout the entire surgical procedure. If the assistant surgeon was other than a qualified resident, I certify that the services were medically necessary and there was no qualified  resident available to perform the services.

## 2020-05-31 NOTE — Anesthesia Procedure Notes (Signed)
Airway  Date/Time: 05/31/2020 7:45 AM  Urgency: elective    no    Final Airway Details  Final airway type: endotracheal airway      Successful airway: ETT - single  Cuffed: yes   Successful intubation technique: video laryngoscopy - GlideScope  Endotracheal tube insertion site: oral  Blade: Macintosh  Blade size: #3  ETT size (mm): 7.5  Cormack-Lehane Classification (DL): grade I - full view of glottis  Placement verified by: chest auscultation and capnometry   Measured from: lips  ETT Depth (cm): 25  Number of attempts at approach: 1  Number of other approaches attempted: 0    General Information and Staff  Patient location during procedure: OR  Performed: CRNA   Anesthesiologist: Carter Kitten, MD  Resident/Fellow/CRNA: Orland Dec, CRNA    Indications and Patient Condition  Indications for airway management: anesthesia  Spontaneous Ventilation: absent  Sedation level: anesthesia  Preoxygenated: yes  Patient position: sniffing  MILS not maintained throughout  Mask difficulty assessment: 0 - not attempted      For full procedure information, see associated anesthesia event/encounter.

## 2020-05-31 NOTE — Consults (Signed)
Initial Respiratory Care Consult for Airway Clearance Technique/Lung Expansion Technique Pathway    Identified Pathway: LET Pathway  ACT Pathway:    LET Pathway: Oscillating PEP    Frequency: BID & PRN    Primary pulmonary status/diagnosis: Suspected or impending atelectasis (LET Pathway)  Contraindications: NO: Evaluate for frequency of technique(s)  Chest X-ray: Clear or not available  Right breath sounds: Clear, Diminished  Left breath sounds: Clear, Diminished  Respirations: 20  Respiratory pattern: Regular  Dyspnea Assessment (Revised Borg): Nothing at all  Cough: Strong, Non-productive  SpO2: 99 %  Level of consciousness: Alert, Awake  Level of activity: In bed  Smoking history: Less than or equal to 1 pk/day  Surgical history (Present Admission): Thoracic or upper abdominal  Total points: 6    Kayla Frey  Time spent: 15 minutes

## 2020-05-31 NOTE — Anesthesia Post-Procedure Evaluation (Addendum)
Anesthesia Post-op Evaluation    Scheduled date of Operation: 05/31/2020    Scheduled Surgeon(s):Jonathan T. Montez Morita, MDMark Ashley Jacobs, MD  Scheduled Procedure(s):LAPAROSCOPIC SLEEVE GASTRECTOMY FOR WEIGHT LOSS    Final Anesthesia Type: general    Assessment  Respiratory Function:      Airway Patency: Excellent      Respiratory Rate: See vitals below      SpO2: See vitals below      Overall Respiratory Assessment: Stable  Cardiovascular Function:      Pulse Rate: See Vitals Below      Blood Pressure: See Vitals Below      Cardiac status: Stable  Mental Status:      RASS Score: 0 Alert or calm  Temperature: Normothermic  Pain Control: Adequate  Nausea and Vomiting: Absent  Fluids/Hydration Status: Euvolemic    Complications (anesthesia/case associated complications, possible complications, and/or significant issues; as of time of note completion: Emergence agitation  Recommendations, Comments & Considerations (including descriptions of any apparent complications and/or significant issues): Emergence agitation. Improved with dexmedetomidine, small doses of propofol, and fentanyl. No desaturation. Vitals stable.  Patient calm and relaxed when I returned to check on her later in PACU, complaining of mild soreness in abdomen..    Plan  Follow-up care: As per primary team    Post-op Note Status: Complete, patient participated in evaluation, which occurred after recovery from anesthesia but prior to 48 hours from end of case                 Recent Pre-op and Post-op Vital Signs  Vitals:    05/31/20 1130 05/31/20 1145 05/31/20 1200   BP: (!) 160/89 (!) 161/88 (!) 160/89   Pulse: 71 73 72   Resp: (!) 22 (!) 25 (!) 23   Temp:      TempSrc:      SpO2: 98% 98% 97%     Last Vital Signs Out of Room to Anesthesia Stop  Vitals Value Taken Time   Pulse 74 05/31/20 1012   Resp 11 05/31/20 1011   SpO2 99 % 05/31/20 1012   BP 181/111 05/31/20 1000   Arterial Line BP (mmHg)      Arterial Line MAP (mmHg)     Arterial Line 2 BP (mmHg)      Arterial Line 2 MAP (mmHg)     Temp 36 C (96.8 F) 05/31/20 1000   Temp src Temporal 05/31/20 1000   Vitals shown include unvalidated device data.    Case Tracking Events:  Event Time In   In SWA - Pre Reg 0530   In Facility 0607   In SWA 0608   In Pre-op 0610   Anesthesia Start 0721   Anesthesia Ready 0753   In Room 0730   Airway-Start 0745   Procedure Start 0807   PACU/Unit Notified 0902   Procedure Finish 0933   Extubation 0936   Out of Room 0954   Anesthesia Finish 1013   In PACU 1000   Ready for Discharge 1212   Patient Out of PACU 1213   RN Return to PACU 1230

## 2020-05-31 NOTE — Interval H&P Note (Signed)
ATTENDING SURGEON PREOPERATIVE NOTE  Kayla Frey is a 34 y.o. female with the following pre-op diagnosis: Pre-Op Diagnosis Codes:     * Severe obesity (CMS code) [E66.01]    Planned procedure: laparoscopic sleeve gastrectomy  Indications:  severe obesity    INTERVAL HISTORY AND PHYSICAL  The patient's history and physical were reviewed.  I have performed an appropriate, condition-specific physical examination today, and the indication still exists for surgery.     There are no changes in the patients health history, review of systems, or physical findings since the previously recorded history and physical.    Physical exam done today: abdomen soft    DOCUMENTATION OF INFORMED CONSENT  I have discussed the risks, benefits, and alternatives of the procedure with the patient and/or the patient's medical decision-maker.  This discussion included, but was not limited to, the risk of bleeding, infection, damage to anatomical structures, need for reoperation, or even death.  The patient and/or the patient's medical decision maker understands, has had all of his/her questions answered, and desires to proceed.  Informed consent obtained.    SIMULTANEOUS OPERATIONS  If I am attending more than one operation simultaneously, this surgeon is my backup: none

## 2020-06-01 DIAGNOSIS — R002 Palpitations: Secondary | ICD-10-CM

## 2020-06-01 LAB — COMPLETE BLOOD COUNT
Hematocrit: 28.2 % — ABNORMAL LOW (ref 36.0–46.0)
Hematocrit: 26.1 % — ABNORMAL LOW (ref 36.0–46.0)
Hemoglobin: 9.1 g/dL — ABNORMAL LOW (ref 12.0–15.5)
Hemoglobin: 8.3 g/dL — ABNORMAL LOW (ref 12.0–15.5)
MCH: 29.2 pg (ref 26.0–34.0)
MCH: 29 pg (ref 26.0–34.0)
MCHC: 32.3 g/dL (ref 31.0–36.0)
MCHC: 31.8 g/dL (ref 31.0–36.0)
MCV: 90 fL (ref 80–100)
MCV: 91 fL (ref 80–100)
Platelet Count: 373 10*9/L (ref 140–450)
Platelet Count: 351 10*9/L (ref 140–450)
RBC Count: 3.12 10*12/L — ABNORMAL LOW (ref 4.00–5.20)
RBC Count: 2.86 10*12/L — ABNORMAL LOW (ref 4.00–5.20)
WBC Count: 21.8 10*9/L — ABNORMAL HIGH (ref 3.4–10.0)
WBC Count: 20.9 10*9/L — ABNORMAL HIGH (ref 3.4–10.0)

## 2020-06-01 LAB — COMPLETE BLOOD COUNT WITH DIFF
Abs Basophils: 0.04 10*9/L (ref 0.00–0.10)
Abs Basophils: 0.03 10*9/L (ref 0.00–0.10)
Abs Eosinophils: 0 10*9/L (ref 0.00–0.40)
Abs Eosinophils: 0 10*9/L (ref 0.00–0.40)
Abs Imm Granulocytes: 0.15 10*9/L — ABNORMAL HIGH (ref <0.10)
Abs Imm Granulocytes: 0.22 10*9/L — ABNORMAL HIGH (ref <0.10)
Abs Lymphocytes: 1.6 10*9/L (ref 1.00–3.40)
Abs Lymphocytes: 1.79 10*9/L (ref 1.00–3.40)
Abs Monocytes: 0.69 10*9/L (ref 0.20–0.80)
Abs Monocytes: 1 10*9/L — ABNORMAL HIGH (ref 0.20–0.80)
Abs Neutrophils: 20.62 10*9/L — ABNORMAL HIGH (ref 1.80–6.80)
Abs Neutrophils: 20.6 10*9/L — ABNORMAL HIGH (ref 1.80–6.80)
Hematocrit: 30.5 % — ABNORMAL LOW (ref 36.0–46.0)
Hematocrit: 30.5 % — ABNORMAL LOW (ref 36.0–46.0)
Hemoglobin: 9.7 g/dL — ABNORMAL LOW (ref 12.0–15.5)
Hemoglobin: 9.9 g/dL — ABNORMAL LOW (ref 12.0–15.5)
MCH: 29.1 pg (ref 26.0–34.0)
MCH: 29.6 pg (ref 26.0–34.0)
MCHC: 31.8 g/dL (ref 31.0–36.0)
MCHC: 32.5 g/dL (ref 31.0–36.0)
MCV: 92 fL (ref 80–100)
MCV: 91 fL (ref 80–100)
Platelet Count: 420 10*9/L (ref 140–450)
Platelet Count: 403 10*9/L (ref 140–450)
RBC Count: 3.33 10*12/L — ABNORMAL LOW (ref 4.00–5.20)
RBC Count: 3.35 10*12/L — ABNORMAL LOW (ref 4.00–5.20)
WBC Count: 23.1 10*9/L — ABNORMAL HIGH (ref 3.4–10.0)
WBC Count: 23.6 10*9/L — ABNORMAL HIGH (ref 3.4–10.0)

## 2020-06-01 LAB — BASIC METABOLIC PANEL (NA, K,
Anion Gap: 9 (ref 4–14)
Anion Gap: 8 (ref 4–14)
Calcium, total, Serum / Plasma: 8.5 mg/dL (ref 8.4–10.5)
Calcium, total, Serum / Plasma: 8.5 mg/dL (ref 8.4–10.5)
Carbon Dioxide, Total: 23 mmol/L (ref 22–29)
Carbon Dioxide, Total: 25 mmol/L (ref 22–29)
Chloride, Serum / Plasma: 102 mmol/L (ref 101–110)
Chloride, Serum / Plasma: 103 mmol/L (ref 101–110)
Creatinine: 1.21 mg/dL — ABNORMAL HIGH (ref 0.55–1.02)
Creatinine: 1.19 mg/dL — ABNORMAL HIGH (ref 0.55–1.02)
Glucose, non-fasting: 183 mg/dL (ref 70–199)
Glucose, non-fasting: 165 mg/dL (ref 70–199)
Potassium, Serum / Plasma: 4.5 mmol/L (ref 3.5–5.0)
Potassium, Serum / Plasma: 4.8 mmol/L (ref 3.5–5.0)
Sodium, Serum / Plasma: 134 mmol/L — ABNORMAL LOW (ref 135–145)
Sodium, Serum / Plasma: 136 mmol/L (ref 135–145)
Urea Nitrogen, Serum / Plasma: 16 mg/dL (ref 7–25)
Urea Nitrogen, Serum / Plasma: 17 mg/dL (ref 7–25)
eGFR - high estimate: 68 mL/min (ref >59)
eGFR - high estimate: 69 mL/min (ref >59)
eGFR - low estimate: 59 mL/min — ABNORMAL LOW (ref >59)
eGFR - low estimate: 60 mL/min (ref >59)

## 2020-06-01 LAB — MAGNESIUM, SERUM / PLASMA: Magnesium, Serum / Plasma: 2.2 mg/dL (ref 1.6–2.6)

## 2020-06-01 LAB — COVID-19 RNA, RT-PCR/NUCLEIC A: COVID-19 RNA, RT-PCR/Nucleic A: NOT DETECTED

## 2020-06-01 LAB — LACTATE, PLASMA
Lactate, plasma: 2.8 mmol/L — ABNORMAL HIGH (ref 0.5–2.2)
Lactate, plasma: 1.4 mmol/L (ref 0.5–2.2)

## 2020-06-01 LAB — POCT GLUCOSE: Glucose, iSTAT: 238 mg/dL — ABNORMAL HIGH (ref 70–199)

## 2020-06-01 LAB — PHOSPHORUS, SERUM / PLASMA: Phosphorus, Serum / Plasma: 3.7 mg/dL (ref 2.3–4.7)

## 2020-06-01 MED ORDER — SODIUM CHLORIDE 0.9 % IV BOLUS
0.9 | Freq: Once | INTRAVENOUS | Status: AC
Start: 2020-06-01 — End: 2020-06-01
  Administered 2020-06-01: 14:00:00 1000 mL via INTRAVENOUS

## 2020-06-01 MED ORDER — ALBUMIN, HUMAN 5 % INTRAVENOUS SOLUTION
5 % | INTRAVENOUS | Status: AC
  Administered 2020-06-02: 05:00:00 250 mL via INTRAVENOUS

## 2020-06-01 MED ORDER — PANTOPRAZOLE 40 MG INTRAVENOUS SOLUTION
4 | Freq: Two times a day (BID) | INTRAVENOUS | Status: DC
Start: 2020-06-01 — End: 2020-06-04
  Administered 2020-06-02 – 2020-06-04 (×6): 40 mg via INTRAVENOUS

## 2020-06-01 MED ORDER — LACTATED RINGERS IV BOLUS
INTRAVENOUS | Status: DC
Start: 2020-06-01 — End: 2020-06-01
  Administered 2020-06-01: 20:00:00 1000 mL via INTRAVENOUS

## 2020-06-01 MED FILL — LANSOPRAZOLE 30 MG DELAYED RELEASE,DISINTEGRATING TABLET: 30 mg | ORAL | Qty: 1

## 2020-06-01 MED FILL — ACETAMINOPHEN 500 MG TABLET: 500 mg | ORAL | Qty: 2

## 2020-06-01 MED FILL — HEPARIN, PORCINE (PF) 5,000 UNIT/0.5 ML INJECTION SOLUTION: 5000 unit/0.5 mL | INTRAMUSCULAR | Qty: 0.5

## 2020-06-01 MED FILL — OXYCODONE 5 MG TABLET: 5 mg | ORAL | Qty: 1

## 2020-06-01 MED FILL — DILAUDID (PF) 0.5 MG/0.5 ML INJECTION SYRINGE: 0.5 mg/ mL | INTRAMUSCULAR | Qty: 0.5

## 2020-06-01 MED FILL — LIDOCAINE 5 % TOPICAL PATCH: 5 % | TOPICAL | Qty: 1

## 2020-06-01 MED FILL — ESCITALOPRAM 10 MG TABLET: 10 mg | ORAL | Qty: 2

## 2020-06-01 MED FILL — CELECOXIB 200 MG CAPSULE: 200 mg | ORAL | Qty: 1

## 2020-06-01 MED FILL — ONDANSETRON HCL (PF) 4 MG/2 ML INJECTION SOLUTION: 4 mg/2 mL | INTRAMUSCULAR | Qty: 2

## 2020-06-01 MED FILL — GABAPENTIN 300 MG CAPSULE: 300 mg | ORAL | Qty: 2

## 2020-06-01 NOTE — Consults (Addendum)
[facility]  Department of Nutrition Services  Bariatric Diet Education    Assessment Data:  Patient history: 34 y.o. female with PMH of anxiety, depression, trauma in childhood, and morbid obesity. S/P elective laparoscopic sleeve gastrectomy 05/31/2020. Nutrition consulted for bariatric diet education.     Diet order: Bariatric Diet Bariatric Stage 2 (Full Liquid)     Anthropometrics:  Ht: 170.2 cm (5' 7.01")  Wt for BMI of 25: 72.4 kg  Pre-op Weight: (10/7) 199.6 kg  Excess Body Weight (EBW = Pre-op wt - Wt for BMI of 25): 127.2 kg  BMI: 68.9 kg/m2 (based on 199.6 kg) c/w class 3 obesity    Biochemical:   Biochemical:  Lab Results   Component Value Date    NA 134 (L) 06/01/2020    K 4.5 06/01/2020    BUN 16 06/01/2020    CREAT 1.21 (H) 06/01/2020    GLU 183 06/01/2020    CA 8.5 06/01/2020    MG 2.2 06/01/2020    PO4 3.7 06/01/2020   Comment: Na lab level low, noted pt receiving repletion.CREAT lab level elevated; noted pt found to have fatty liver (per progress note 10/7)    Nutritionally relevant medications:    0.9% sodium chloride bolus 1000 mL Intravenous Once    escitalopram oxalate  20 mg Oral Q AM [place]    lansoprazole  30 mg Oral Q AM Before Breakfast [place]    scopolamine  1 patch Transdermal Q72H    senna  17.2 mg Oral Daily At Bedtime [facility]   Continuous Meds: dextrose 5% in lactated ringers infusion  PRN Meds: Zofran, oxyCODONE    Nutrition History: Met with pt at bedside. PTA, pt confirms she was on full liquid for 2 weeks prior to surgery and was consuming Premier protein oral supplement. Pt also reports taking biotin, vitamin D, and Bari Melts vitamins. In-house, pt has been experiencing abdominal pain and n/v last night post-op; denies diarrhea or constipation. Pt reports trying some cold cranberry juice but did not tolerate well, states that warm water helped with GI symptoms.     Nutrition Diagnosis: Limited food and nutrition related knowledge related to lack of prior nutrition related  education as evidenced by new calorie restricted diet guidelines s/p recent bariatric surgery.    Nutrition Intervention:  Comprehensive nutrition education:   Met w/ pt at bedside to discuss the following nutrition plan s/p bariatric surgery:  --> Goal of calorie restricted diet, 60 to 80 grams protein of low fat selections.  --> Provided written guidelines of meal progression from clear liquid diet in the hospital with transition to thicker liquids, high protein meals after discharge with gradual increase to pureed soft foods as tolerated.  --> Avoid concentrated sweets and sweetened beverages to minimize risk of dumping syndrome  --> Reviewed the following bariatric vitamin/mineral regimen:   '[x]'  Option #1: ProCare MVI   ProCare Bariatric Complete Chewable or Capsule Multivitamin with Minerals (1 tab, daily) PO.    Celebrate Calcium Citrate + Vitamin D Soft Chew (500 mg Ca + 500 units vitamin D chew, BID) PO.  --> Fluid goal of ~2 liters per day to prevent dehydration  --> Discussed bariatric plate method to be followed for life after diet advancement.  --> Follow up with outpatient dietitian and support group recommended.  --> Materials provided: Summary of Diet Advancement for Bariatric Surgery", "Bariatric Vitamin and Mineral Recommendations", "Bariatric Plate Method", "Diet after Bariatric Surgery"    Barriers to Learning: noted pt  somnolent    Comprehension: pt receptive to education and verbalized understanding of the material covered, asking appropriate questions. Appears in the preparation stage of change. Expect good compliance.     Nutrition Monitoring and Evaluation Goals:   See nutrition care plan for goals/progress.    Butler Denmark  Dietetic Intern  Voalte: [phone]    Huntley Dec, RD, CNSC  Voalte: [phone]  Salomon Mast Pager: [phone]

## 2020-06-01 NOTE — Consults (Signed)
Follow up Respiratory Care Consult for Airway Clearance Technique/Lung Expansion Technique Pathway    Identified Pathway: LET Pathway  LET Pathway: Oscillating PEP    Frequency: BID+PRN/ Self Administer      Primary pulmonary status/diagnosis: Suspected or impending atelectasis (LET Pathway)  Contraindications: NO: Evaluate for frequency of technique(s)  Chest X-ray: Clear or not available  Right breath sounds: Clear, Diminished  Left breath sounds: Clear, Diminished  Respirations: 18  Respiratory pattern: Regular, Unlabored  Dyspnea Assessment (Revised Borg): Nothing at all  Cough: Strong, Non-productive  SpO2: 99 %  Level of consciousness: Alert, Awake  Level of activity: In bed  Smoking history: Less than or equal to 1 pk/day  Surgical history (Present Admission): Thoracic or upper abdominal  Total points: 7      [name] RCP  Time spent: 

## 2020-06-01 NOTE — Plan of Care (Signed)
Problem: Pain,  Acute / Chronic- Adult  Goal: Control of acute pain  Outcome: Progress within 12 hours     Problem: Fall, at Risk or Actual - Adult  Goal: Absence of falls and fall related injury  Outcome: Progress within 12 hours  Goal: Knowledge of fall prevention  Outcome: Progress within 12 hours

## 2020-06-01 NOTE — Progress Notes (Addendum)
SURGERY POST-OP /PROGRESS NOTE     Problem-based Assessment & Plan  Kayla Frey is a 34 y.o. female with a history of anxiety, depression, trauma in childhood, and morbid obesity with a BMI of 68. She was taken to the OR on 05/31/2020 for elective laparoscopic sleeve gastrectomy with Dr. Montez Morita. The patient tolerated the procedure well and there were no complications. Immediately after the procedure, the patient was extubated and the foley was removed. The patient recovered in the PACU and was transferred to the acute care floor.     ##s/p lap sleeve gastrectomy    ##BMI 60.0-69.9, adult (CMS code)  -  Serial CBC   - Bolus 1 L NS  - Stage 2 bariatric diet w/ LR at 100 mL/hr  - All tablets crushed. Capsules okay.  - Continue PO APAP, gabapentin, Celebrex, lidocaine patch, and crushed oxycodone. Avoid IV narcotics.  - Scopolamine patch, PRN Zofran  - Lansoprazole  - CPO.  - Encourage incentive spirometry  - Lung expansion pathway  - Encourage ambulation  - SCDs  - SQH  - Extended DVT ppx on DC. NO aspirin.  - Penrose drain to be removed on POD 2.      ##PTSD  ##Anxiety disorder  - Continue lexapro   - Female staff only for personal care    ##Irregular menstrual cycle  - Cont OCPs    24 Hour Course  O/N dizzy at edge of bed BP 128/42 HR 130s unable to stand, supine BP 102/76 HR 116 ->160. LR 250 x2, EKG ST. All day in ST. CBC trend 12.3 -> 9.7-> 9.1 -> 8.4 -> 7.8. Lactate 2.8 ->1.4 Total boluses since midnight 3 L.    Subjective  Epigastic pain tolerable. No nausea, dizzy. Denies chest pain.    Vitals  Temp:  [36 C (96.8 F)-37 C (98.6 F)] 37 C (98.6 F)  Heart Rate:  [107-165] 108  *Resp:  [16-20] 18  BP: (102-147)/(42-94) 136/80  SpO2:  [98 %-100 %] 100 %    Input / Output  I/O last 2 completed shifts plus current shift:  In: 6660 [P.O.:460; I.V.:3000; IV Piggyback:3200]  Out: 1270 [Urine:1220; Emesis:50]    Physical Exam  Physical Exam  Constitutional:       General: She is not in acute distress.      Appearance: Normal appearance. She is obese.   HENT:      Head: Normocephalic.      Right Ear: External ear normal.      Left Ear: External ear normal.   Cardiovascular:      Pulses: Normal pulses.   Pulmonary:      Effort: Pulmonary effort is normal. No respiratory distress.   Abdominal:      General: There is no distension.      Palpations: Abdomen is soft.      Tenderness: There is abdominal tenderness. There is no guarding.      Comments: Laparoscopic incisions glued and open to air. No surrounding erythema, increased warmth, or drainage. Obese, non distended abdomen appropriately tender to palpation.      Musculoskeletal:         General: Normal range of motion.   Skin:     General: Skin is dry.      Capillary Refill: Capillary refill takes less than 2 seconds.   Neurological:      General: No focal deficit present.      Mental Status: She is alert and oriented to person, place, and time.  Psychiatric:         Behavior: Behavior normal.         Data  Recent Labs     06/01/20  1639 06/01/20  1154 06/01/20  0506 06-20-20  2331 20-Jun-2020  2058 2020-06-20  1452 June 20, 2020  1011   WBC 19.4* 20.9* 21.8* 23.6* 23.1* 15.9* 13.1*   HGB 7.8* 8.3* 9.1* 9.9* 9.7* 12.3 11.6*   HCT 24.4* 26.1* 28.2* 30.5* 30.5* 38.9 36.6   PLT 307 351 373 403 420 320 286   NA  --  136 134*  --   --   --   --    K  --  4.8 4.5  --   --   --   --    CL  --  103 102  --   --   --   --    CO2  --  25 23  --   --   --   --    BUN  --  17 16  --   --   --   --    CREAT  --  1.19* 1.21*  --   --   --   --    GLU  --  165 183  --   --   --   --    CA  --  8.5 8.5  --   --   --   --    MG  --   --  2.2  --   --   --   --    PO4  --   --  3.7  --   --   --   --          Brief Operative Note    Surgeon(s) and Role:     * Georgia Duff, MD - Primary     * Roe Rutherford, MD - Fellow - Assisting    Date of Operation: 06/20/20    Pre-Op Diagnosis Codes:     * Severe obesity (CMS code) [E66.01]    Post-Op Diagnosis Codes:     * Severe obesity  (CMS code) [E66.01]    Procedure(s) and Anesthesia Type:     * LAPAROSCOPIC SLEEVE GASTRECTOMY FOR WEIGHT LOSS - Anes-General    Implants:   Implant Name Type Inv. Item Serial No. Manufacturer Lot No. LRB No. Used Action   [employer]   VST10 - [id number] Other [employer]   VST10 [id number]  [id number] N/A 1 Implanted        ID Type Source Tests Collected by Time Destination   1 : Greater Curvature of the Stomach Other Other(specify) SURGICAL PATHOLOGY Georgia Duff, MD 20-Jun-2020 [phone]        Findings: fatty liver    Drains: none    Complications: none    Disposition and Plan: admit to RNF    Estimated Blood Loss: 20 mL             During this hospitalization the patient is also being treated for:   N/A    Code Status: FULL    Adelina Mings MSN, FNP-BC  Nurse Practitioner  [facility]      06/01/2020

## 2020-06-01 NOTE — Consults (Signed)
CLINICAL PHARMACY DISCHARGE NOTE       Service: [facility]  Pharmacy Consult Note, Post-Operative  Bariatric Surgery Discharge Recommendations     Name: Kayla Frey  MRN: [id number]  DOB: 03-12-1986     Procedure:Sleeve gastrectomy  Surgeon: Dr. Georgia Duff, MD    Summary    I reviewed the patient's medication list.  Updates to their medications include: discontinuation of Chantix, probiotic, and docusate.     Upon review of the patients discharge medication plan, she recalled all of the pertinent medication instructions as previously discussed with bariatric clinic pharmacist. Any changes to the patients discharge medication plan were reviewed with the patient and all questions were answered.     I evaluated the patients need for extended VTE prophylaxis on discharge. Based on their calculated risk due to high BMI, the patient qualifies for extended prophylaxis.  The recommended regimen is: Enoxaparin 60 mg subcutaneously twice daily for 14 days.  This recommendation was discussed with the surgery team.    Insurance coverage was determined for the patients discharge medications and any problems were communicated to the surgery team.    I spent a total of 30 minutes with the patient via telephone and 30 minutes preparing their discharge medications.    Marianna Payment, Pharmacy Student  General Surgery  [place]: available by name

## 2020-06-02 LAB — BASIC METABOLIC PANEL (NA, K,
Anion Gap: 6 (ref 4–14)
Calcium, total, Serum / Plasma: 8.5 mg/dL (ref 8.4–10.5)
Carbon Dioxide, Total: 28 mmol/L (ref 22–29)
Chloride, Serum / Plasma: 104 mmol/L (ref 101–110)
Creatinine: 0.78 mg/dL (ref 0.55–1.02)
Glucose, non-fasting: 105 mg/dL (ref 70–199)
Potassium, Serum / Plasma: 4.3 mmol/L (ref 3.5–5.0)
Sodium, Serum / Plasma: 138 mmol/L (ref 135–145)
Urea Nitrogen, Serum / Plasma: 11 mg/dL (ref 7–25)
eGFR - high estimate: 116 mL/min (ref >59)
eGFR - low estimate: 100 mL/min (ref >59)

## 2020-06-02 LAB — COMPLETE BLOOD COUNT
Hematocrit: 20.1 % — ABNORMAL LOW (ref 36.0–46.0)
Hematocrit: 21.6 % — ABNORMAL LOW (ref 36.0–46.0)
Hematocrit: 24.4 % — ABNORMAL LOW (ref 36.0–46.0)
Hemoglobin: 6.3 g/dL — CL (ref 12.0–15.5)
Hemoglobin: 6.9 g/dL — CL (ref 12.0–15.5)
Hemoglobin: 7.8 g/dL — ABNORMAL LOW (ref 12.0–15.5)
MCH: 29.2 pg (ref 26.0–34.0)
MCH: 29.2 pg (ref 26.0–34.0)
MCH: 29.2 pg (ref 26.0–34.0)
MCHC: 31.3 g/dL (ref 31.0–36.0)
MCHC: 31.9 g/dL (ref 31.0–36.0)
MCHC: 32 g/dL (ref 31.0–36.0)
MCV: 91 fL (ref 80–100)
MCV: 92 fL (ref 80–100)
MCV: 93 fL (ref 80–100)
Platelet Count: 222 10*9/L (ref 140–450)
Platelet Count: 229 10*9/L (ref 140–450)
Platelet Count: 307 10*9/L (ref 140–450)
RBC Count: 2.16 10*12/L — ABNORMAL LOW (ref 4.00–5.20)
RBC Count: 2.36 10*12/L — ABNORMAL LOW (ref 4.00–5.20)
RBC Count: 2.67 10*12/L — ABNORMAL LOW (ref 4.00–5.20)
WBC Count: 13 10*9/L — ABNORMAL HIGH (ref 3.4–10.0)
WBC Count: 13.4 10*9/L — ABNORMAL HIGH (ref 3.4–10.0)
WBC Count: 19.4 10*9/L — ABNORMAL HIGH (ref 3.4–10.0)

## 2020-06-02 LAB — PREPARE RBC
RBCs - Units Ready: 1
RBCs - Units Ready: 2

## 2020-06-02 LAB — PHOSPHORUS, SERUM / PLASMA: Phosphorus, Serum / Plasma: 2.2 mg/dL — ABNORMAL LOW (ref 2.3–4.7)

## 2020-06-02 LAB — MAGNESIUM, SERUM / PLASMA: Magnesium, Serum / Plasma: 2.1 mg/dL (ref 1.6–2.6)

## 2020-06-02 MED ORDER — LACTATED RINGERS INTRAVENOUS SOLUTION
INTRAVENOUS | Status: DC
Start: 2020-06-02 — End: 2020-06-03
  Administered 2020-06-02 – 2020-06-03 (×2): 100 mL/h via INTRAVENOUS

## 2020-06-02 MED ORDER — ALBUMIN, HUMAN 5 % INTRAVENOUS SOLUTION
5 % | INTRAVENOUS | Status: AC
  Administered 2020-06-02: 09:00:00 250 mL via INTRAVENOUS

## 2020-06-02 MED ORDER — SODIUM PHOSPHATE 3 MMOL/ML INTRAVENOUS SOLUTION
3 mmol/mL | Freq: Once | INTRAVENOUS | Status: AC
Start: 2020-06-02 — End: 2020-06-02
  Administered 2020-06-02: 18:00:00 20 mmol via INTRAVENOUS

## 2020-06-02 MED ORDER — DEXTROSE 5 % AND LACTATED RINGERS INTRAVENOUS SOLUTION
INTRAVENOUS | Status: DC
Start: 2020-06-02 — End: 2020-06-02

## 2020-06-02 MED FILL — OXYCODONE 5 MG TABLET: 5 mg | ORAL | Qty: 1

## 2020-06-02 MED FILL — PANTOPRAZOLE 40 MG INTRAVENOUS SOLUTION: 4 mg/mL | INTRAVENOUS | Qty: 10

## 2020-06-02 MED FILL — ALBUTEIN 5 % INTRAVENOUS SOLUTION: 5 % | INTRAVENOUS | Qty: 250

## 2020-06-02 MED FILL — ACETAMINOPHEN 500 MG TABLET: 500 mg | ORAL | Qty: 2

## 2020-06-02 MED FILL — ESCITALOPRAM 10 MG TABLET: 10 mg | ORAL | Qty: 2

## 2020-06-02 MED FILL — LIDOCAINE 5 % TOPICAL PATCH: 5 % | TOPICAL | Qty: 1

## 2020-06-02 MED FILL — SODIUM PHOSPHATE 3 MMOL/ML INTRAVENOUS SOLUTION: 3 mmol/mL | INTRAVENOUS | Qty: 6.67

## 2020-06-02 MED FILL — GABAPENTIN 300 MG CAPSULE: 300 mg | ORAL | Qty: 2

## 2020-06-02 NOTE — Progress Notes (Signed)
SURGERY POST-OP /PROGRESS NOTE     Problem-based Assessment & Plan  Kayla Frey is a 34 y.o. female with a history of anxiety, depression, trauma in childhood, and morbid obesity with a BMI of 68. She was taken to the OR on 05/31/2020 for elective laparoscopic sleeve gastrectomy with Dr. Montez Morita. Post-op course significant for acute blood loss anemia requiring transfusion.    ##s/p lap sleeve gastrectomy    ##BMI 60.0-69.9, adult (CMS code)  - 1u pRBC this AM  - CBC q12h  - Stage 2 bariatric diet w/ LR at 100 mL/hr  - All tablets crushed. Capsules okay.  - Continue PO APAP, gabapentin, Celebrex, lidocaine patch, and crushed oxycodone. Avoid IV narcotics.  - Scopolamine patch, PRN Zofran  - BID IV pantoprazole  - CPO.  - Encourage incentive spirometry  - Lung expansion pathway  - Encourage ambulation  - SCDs  - Holding SQH  - Extended DVT ppx on DC. NO aspirin.    ##PTSD  ##Anxiety disorder  - Continue lexapro   - Female staff only for personal care    ##Irregular menstrual cycle  - Cont OCPs    Subjective  Pain and overall wellbeing improved. Tolerating stage 2 diet. Dizziness improved this AM.    Vitals  Temp:  [36.6 C (97.9 F)-37.3 C (99.1 F)] 36.8 C (98.2 F)  Heart Rate:  [94-165] 102  *Resp:  [16-18] 18  BP: (112-147)/(56-94) 147/60  SpO2:  [99 %-100 %] 100 %    Input / Output  I/O last 2 completed shifts plus current shift:  In: 3708.33 [P.O.:270; I.V.:1938.33; IV Piggyback:1500]  Out: 860 [Urine:860]    Physical Exam  Physical Exam  Constitutional:       General: She is not in acute distress.     Appearance: Normal appearance. She is obese.   HENT:      Head: Normocephalic.      Right Ear: External ear normal.      Left Ear: External ear normal.   Cardiovascular:      Pulses: Normal pulses.   Pulmonary:      Effort: Pulmonary effort is normal. No respiratory distress.   Abdominal:      General: There is no distension.      Palpations: Abdomen is soft.      Tenderness: There is abdominal tenderness.  There is no guarding.      Comments: Laparoscopic incisions glued and open to air. No surrounding erythema, increased warmth, or drainage. Obese, non distended abdomen appropriately tender to palpation.      Musculoskeletal:         General: Normal range of motion.   Skin:     General: Skin is dry.      Capillary Refill: Capillary refill takes less than 2 seconds.   Neurological:      General: No focal deficit present.      Mental Status: She is alert and oriented to person, place, and time.   Psychiatric:         Behavior: Behavior normal.         Data  Recent Labs     06/02/20  0435 06/01/20  1639 06/01/20  1154 06/01/20  0506 05/31/20  2331 05/31/20  2058 05/31/20  1452 05/31/20  1011   WBC 13.0* 19.4* 20.9* 21.8* 23.6* 23.1* 15.9* 13.1*   HGB 6.3* 7.8* 8.3* 9.1* 9.9* 9.7* 12.3 11.6*   HCT 20.1* 24.4* 26.1* 28.2* 30.5* 30.5* 38.9 36.6   PLT 229  307 351 373 403 420 320 286   NA  --   --  136 134*  --   --   --   --    K  --   --  4.8 4.5  --   --   --   --    CL  --   --  103 102  --   --   --   --    CO2  --   --  25 23  --   --   --   --    BUN  --   --  17 16  --   --   --   --    CREAT  --   --  1.19* 1.21*  --   --   --   --    GLU  --   --  165 183  --   --   --   --    CA  --   --  8.5 8.5  --   --   --   --    MG  --   --   --  2.2  --   --   --   --    PO4  --   --   --  3.7  --   --   --   --        During this hospitalization the patient is also being treated for:   N/A    Code Status: FULL    Ethelwyn Gilbertson J. Dareen Piano, MD  Fellow  Minimally Invasive Surgery  [facility]  [place] pager: [phone]  06/02/2020 9:17 AM

## 2020-06-02 NOTE — Plan of Care (Signed)
Problem: Hemodynamic Instability - Perianesthesia Care  Goal: Maintain / promote hemodynamic stability  Outcome: Progress within 12 hours     Problem: Discharge Planning - Adult  Goal: Knowledge of and participation in plan of care  Outcome: Progress within 12 hours     Problem: Pain,  Acute / Chronic- Adult  Goal: Control of acute pain  Outcome: Progress within 12 hours  Goal: Involvement in pain management plan  Outcome: Progress within 12 hours     Problem: Fall, at Risk or Actual - Adult  Goal: Absence of falls and fall related injury  Outcome: Progress within 12 hours  Goal: Knowledge of fall prevention  Outcome: Progress within 12 hours

## 2020-06-03 ENCOUNTER — Inpatient Hospital Stay: Admit: 2020-06-03 | Discharge: 2020-06-03 | Payer: MEDICAID

## 2020-06-03 DIAGNOSIS — K661 Hemoperitoneum: Secondary | ICD-10-CM

## 2020-06-03 LAB — BASIC METABOLIC PANEL (NA, K,
Anion Gap: 7 (ref 4–14)
Calcium, total, Serum / Plasma: 8.4 mg/dL (ref 8.4–10.5)
Carbon Dioxide, Total: 28 mmol/L (ref 22–29)
Chloride, Serum / Plasma: 103 mmol/L (ref 101–110)
Creatinine: 0.75 mg/dL (ref 0.55–1.02)
Glucose, non-fasting: 89 mg/dL (ref 70–199)
Potassium, Serum / Plasma: 3.9 mmol/L (ref 3.5–5.0)
Sodium, Serum / Plasma: 138 mmol/L (ref 135–145)
Urea Nitrogen, Serum / Plasma: 10 mg/dL (ref 7–25)
eGFR - high estimate: >120 mL/min (ref >59)
eGFR - low estimate: 105 mL/min (ref >59)

## 2020-06-03 LAB — PREPARE RBC: RBCs - Units Ready: 1

## 2020-06-03 LAB — COMPLETE BLOOD COUNT
Hematocrit: 22.1 % — ABNORMAL LOW (ref 36.0–46.0)
Hematocrit: 22.1 % — ABNORMAL LOW (ref 36.0–46.0)
Hematocrit: 25.9 % — ABNORMAL LOW (ref 36.0–46.0)
Hemoglobin: 6.9 g/dL — CL (ref 12.0–15.5)
Hemoglobin: 7.1 g/dL — ABNORMAL LOW (ref 12.0–15.5)
Hemoglobin: 8.3 g/dL — ABNORMAL LOW (ref 12.0–15.5)
MCH: 28.5 pg (ref 26.0–34.0)
MCH: 28.6 pg (ref 26.0–34.0)
MCH: 28.9 pg (ref 26.0–34.0)
MCHC: 31.2 g/dL (ref 31.0–36.0)
MCHC: 32 g/dL (ref 31.0–36.0)
MCHC: 32.1 g/dL (ref 31.0–36.0)
MCV: 89 fL (ref 80–100)
MCV: 90 fL (ref 80–100)
MCV: 91 fL (ref 80–100)
Platelet Count: 210 10*9/L (ref 140–450)
Platelet Count: 212 10*9/L (ref 140–450)
Platelet Count: 242 10*9/L (ref 140–450)
RBC Count: 2.42 10*12/L — ABNORMAL LOW (ref 4.00–5.20)
RBC Count: 2.46 10*12/L — ABNORMAL LOW (ref 4.00–5.20)
RBC Count: 2.9 10*12/L — ABNORMAL LOW (ref 4.00–5.20)
WBC Count: 10.4 10*9/L — ABNORMAL HIGH (ref 3.4–10.0)
WBC Count: 11.8 10*9/L — ABNORMAL HIGH (ref 3.4–10.0)
WBC Count: 12.3 10*9/L — ABNORMAL HIGH (ref 3.4–10.0)

## 2020-06-03 LAB — PHOSPHORUS, SERUM / PLASMA: Phosphorus, Serum / Plasma: 2.8 mg/dL (ref 2.3–4.7)

## 2020-06-03 LAB — COVID-19 RNA, RT-PCR/NUCLEIC A: COVID-19 RNA, RT-PCR/Nucleic A: NOT DETECTED

## 2020-06-03 LAB — MAGNESIUM, SERUM / PLASMA: Magnesium, Serum / Plasma: 2.1 mg/dL (ref 1.6–2.6)

## 2020-06-03 MED ORDER — IOHEXOL 350 MG IODINE/ML INTRAVENOUS SOLUTION
350 | Freq: Once | INTRAVENOUS | Status: AC
Start: 2020-06-03 — End: 2020-06-03
  Administered 2020-06-04: 01:00:00 150 mL via INTRAVENOUS

## 2020-06-03 MED ORDER — DIPHENHYDRAMINE 50 MG/ML INJECTION SOLUTION
50 | INTRAMUSCULAR | Status: AC
Start: 2020-06-03 — End: 2020-06-03
  Administered 2020-06-03: 23:00:00 50 mg via INTRAVENOUS

## 2020-06-03 MED ORDER — DEXTROSE 5 % AND LACTATED RINGERS INTRAVENOUS SOLUTION
INTRAVENOUS | Status: DC
Start: 2020-06-03 — End: 2020-06-04
  Administered 2020-06-03 – 2020-06-04 (×3): 100 mL/h via INTRAVENOUS

## 2020-06-03 MED ORDER — HYDROCORTISONE SOD SUCCINATE (PF) 100 MG/2 ML SOLUTION FOR INJECTION
100 | INTRAMUSCULAR | Status: AC
Start: 2020-06-03 — End: 2020-06-03
  Administered 2020-06-03 (×2): 200 mg via INTRAVENOUS

## 2020-06-03 MED ORDER — POTASSIUM CHLORIDE 10 MEQ/100ML IN STERILE WATER INTRAVENOUS PIGGYBACK
10 | Freq: Once | INTRAVENOUS | Status: AC
Start: 2020-06-03 — End: 2020-06-03
  Administered 2020-06-03: 15:00:00 10 meq via INTRAVENOUS

## 2020-06-03 MED FILL — ACETAMINOPHEN 500 MG TABLET: 500 mg | ORAL | Qty: 2

## 2020-06-03 MED FILL — SOLU-CORTEF ACT-O-VIAL (PF) 100 MG/2 ML SOLUTION FOR INJECTION: 100 mg/2 mL | INTRAMUSCULAR | Qty: 4

## 2020-06-03 MED FILL — SCOPOLAMINE 1 MG OVER 3 DAYS TRANSDERMAL PATCH: 1 mg over 3 days | TRANSDERMAL | Qty: 1

## 2020-06-03 MED FILL — GABAPENTIN 300 MG CAPSULE: 300 mg | ORAL | Qty: 2

## 2020-06-03 MED FILL — DIPHENHYDRAMINE 50 MG/ML INJECTION SOLUTION: 50 mg/mL | INTRAMUSCULAR | Qty: 1

## 2020-06-03 MED FILL — LIDOCAINE 5 % TOPICAL PATCH: 5 % | TOPICAL | Qty: 1

## 2020-06-03 MED FILL — PANTOPRAZOLE 40 MG INTRAVENOUS SOLUTION: 4 mg/mL | INTRAVENOUS | Qty: 10

## 2020-06-03 MED FILL — OXYCODONE 5 MG TABLET: 5 mg | ORAL | Qty: 1

## 2020-06-03 MED FILL — POTASSIUM CHLORIDE 10 MEQ/100ML IN STERILE WATER INTRAVENOUS PIGGYBACK: 10 mEq/0 mL | INTRAVENOUS | Qty: 100

## 2020-06-03 MED FILL — ESCITALOPRAM 10 MG TABLET: 10 mg | ORAL | Qty: 2

## 2020-06-03 NOTE — Plan of Care (Signed)
Problem: Pain,  Acute / Chronic- Adult  Goal: Control of acute pain  Outcome: Progress within 12 hours  Goal: Involvement in pain management plan  Outcome: Progress within 12 hours     Problem: Fall, at Risk or Actual - Adult  Goal: Absence of falls and fall related injury  Outcome: Progress within 12 hours  Goal: Knowledge of fall prevention  Outcome: Progress within 12 hours

## 2020-06-03 NOTE — Progress Notes (Addendum)
SURGERY POST-OP /PROGRESS NOTE     Problem-based Assessment & Plan  Kayla Frey is a 34 y.o. female with a history of anxiety, depression, trauma in childhood, and morbid obesity with a BMI of 68. She was taken to the OR on 05/31/2020 for elective laparoscopic sleeve gastrectomy with Dr. Montez Morita. Post-op course significant for acute blood loss anemia requiring transfusion.    - Hgb 6.9 this AM from 7.1 yesterday evening  - 1u pRBCs this AM (unit #3)  - q12h CBC  - Stage 2 diet  - BID IV pantroprazole  - Holding SQH  - Encourage incentive spirometry  - Lung expansion pathway  - Encourage ambulation  - SCDs  - Holding SQH  - Extended DVT ppx on DC. NO aspirin.    ##PTSD  ##Anxiety disorder  - Continue lexapro   - Female staff only for personal care    ##Irregular menstrual cycle  - Cont OCPs    Subjective  Pain well controlled, feels overall better than yesterday and the day before. Tolerating stage 2 diet. Hgb yet to increment appropriately.    Vitals  Temp:  [36.5 C (97.7 F)-36.9 C (98.4 F)] 36.9 C (98.4 F)  Heart Rate:  [82-119] 82  *Resp:  [16-20] 18  BP: (113-163)/(44-79) 122/58  SpO2:  [94 %-100 %] 99 %    Input / Output  I/O last 2 completed shifts plus current shift:  In: 2391.67 [P.O.:600; I.V.:1471.67]  Out: 900 [Urine:900]    Physical Exam  Physical Exam  Constitutional:       General: She is not in acute distress.     Appearance: Normal appearance. She is obese.   HENT:      Head: Normocephalic.      Right Ear: External ear normal.      Left Ear: External ear normal.   Cardiovascular:      Pulses: Normal pulses.   Pulmonary:      Effort: Pulmonary effort is normal. No respiratory distress.   Abdominal:      General: There is no distension.      Palpations: Abdomen is soft.      Tenderness: There is abdominal tenderness. There is no guarding.      Comments: Laparoscopic incisions glued and open to air. No surrounding erythema, increased warmth, or drainage. Obese, non distended abdomen appropriately  tender to palpation.      Musculoskeletal:         General: Normal range of motion.   Skin:     General: Skin is dry.      Capillary Refill: Capillary refill takes less than 2 seconds.   Neurological:      General: No focal deficit present.      Mental Status: She is alert and oriented to person, place, and time.   Psychiatric:         Behavior: Behavior normal.         Data  Recent Labs     06/03/20  0439 06/02/20  1915 06/02/20  1255 06/02/20  0825 06/02/20  0435 06/01/20  1639 06/01/20  1154 06/01/20  0506 05/31/20  2331 05/31/20  2058 05/31/20  1452 05/31/20  1011   WBC 10.4* 12.3* 13.4*  --  13.0* 19.4* 20.9* 21.8* 23.6* 23.1* 15.9* 13.1*   HGB 6.9* 7.1* 6.9*  --  6.3* 7.8* 8.3* 9.1* 9.9* 9.7* 12.3 11.6*   HCT 22.1* 22.1* 21.6*  --  20.1* 24.4* 26.1* 28.2* 30.5* 30.5* 38.9 36.6  PLT 212 210 222  --  229 307 351 373 403 420 320 286   NA 138  --   --  138  --   --  136 134*  --   --   --   --    K 3.9  --   --  4.3  --   --  4.8 4.5  --   --   --   --    CL 103  --   --  104  --   --  103 102  --   --   --   --    CO2 28  --   --  28  --   --  25 23  --   --   --   --    BUN 10  --   --  11  --   --  17 16  --   --   --   --    CREAT 0.75  --   --  0.78  --   --  1.19* 1.21*  --   --   --   --    GLU 89  --   --  105  --   --  165 183  --   --   --   --    CA 8.4  --   --  8.5  --   --  8.5 8.5  --   --   --   --    MG 2.1  --   --  2.1  --   --   --  2.2  --   --   --   --    PO4 2.8  --   --  2.2*  --   --   --  3.7  --   --   --   --        During this hospitalization the patient is also being treated for:   N/A    Code Status: FULL    Vera Furniss J. Dareen Piano, MD  Fellow  Minimally Invasive Surgery  [facility]  [place] pager: [phone]  06/02/2020 9:17 AM    ATTENDING ATTESTATION:    Agree with above. She has had a slow bleed that started the evening after surgery and seems to have stopped last night.  HCT 22.1 -> 22.1 without transfusion over past 12 hours.  Will top her off with 1 more unit PRBCs and  get a CT AP today to look where the clot is. Continue to monitor hematocrits.

## 2020-06-04 LAB — TYPE AND SCREEN
ABO/RH(D): O POS
Antibody Screen: NEGATIVE
Blood Product Expiration Date: 202110122359
Blood Product Expiration Date: 202110122359
Blood Product Expiration Date: 202111012359
Blood Product Expiration Date: 202111092359
Blood Product Issue Date/Time: 202110081840
Blood Product Issue Date/Time: 202110090650
Blood Product Issue Date/Time: 202110091604
Blood Product Issue Date/Time: 202110100743
UDIV: 0
UDIV: 0
UDIV: 0
UDIV: 0
Unit Type and Rh: 5100
Unit Type and Rh: 5100
Unit Type and Rh: 9500
Unit Type and Rh: 9500
Unit Type and Rh: NEGATIVE
Unit Type and Rh: NEGATIVE
Unit Type and Rh: POSITIVE
Unit Type and Rh: POSITIVE

## 2020-06-04 LAB — BASIC METABOLIC PANEL (NA, K,
Anion Gap: 8 (ref 4–14)
Carbon Dioxide, Total: 28 mmol/L (ref 22–29)
Sodium, Serum / Plasma: 140 mmol/L (ref 135–145)
Urea Nitrogen, Serum / Plasma: 11 mg/dL (ref 7–25)

## 2020-06-04 LAB — MAGNESIUM, SERUM / PLASMA: Magnesium, Serum / Plasma: 2.2 mg/dL (ref 1.6–2.6)

## 2020-06-04 LAB — COMPLETE BLOOD COUNT: WBC Count: 11.3 10*9/L — ABNORMAL HIGH (ref 3.4–10.0)

## 2020-06-04 MED ORDER — FLU VACCINE QS 2021-22(6MOS UP)(PF) 60 MCG(15 MCGX4)/0.5 ML IM SYRINGE
60 | Freq: Once | INTRAMUSCULAR | Status: DC
Start: 2020-06-04 — End: 2020-06-04

## 2020-06-04 MED ORDER — OMEPRAZOLE 20 MG CAPSULE,DELAYED RELEASE
20 | ORAL | 0 refills | Status: AC
Start: 2020-06-04 — End: 2020-09-02

## 2020-06-04 MED ORDER — OXYCODONE 5 MG TABLET
5 | ORAL_TABLET | ORAL | 0 refills | 7.00000 days | Status: AC | PRN
Start: 2020-06-04 — End: 2020-09-17

## 2020-06-04 MED ORDER — URSODIOL 300 MG CAPSULE
300 mg | ORAL_CAPSULE | Freq: Two times a day (BID) | ORAL | 1 refills | Status: AC
Start: 2020-06-04 — End: 2020-09-17

## 2020-06-04 MED ORDER — OMEPRAZOLE 20 MG CAPSULE,DELAYED RELEASE
20 | ORAL_CAPSULE | Freq: Two times a day (BID) | ORAL | 1 refills | Status: DC
Start: 2020-06-04 — End: 2020-06-04

## 2020-06-04 MED ORDER — OMEPRAZOLE 20 MG CAPSULE,DELAYED RELEASE
20 | ORAL_CAPSULE | Freq: Every day | ORAL | 1 refills | Status: DC
Start: 2020-06-04 — End: 2020-06-04

## 2020-06-04 MED ORDER — ESCITALOPRAM 20 MG TABLET
20 | ORAL | 1.00 refills | 90.00000 days | Status: AC
Start: 2020-06-04 — End: 2021-06-19

## 2020-06-04 MED ORDER — GABAPENTIN 300 MG CAPSULE
300 | ORAL_CAPSULE | Freq: Every day | ORAL | 1 refills | 30.00000 days | Status: DC
Start: 2020-06-04 — End: 2020-09-17

## 2020-06-04 MED ORDER — POTASSIUM CHLORIDE 20 MEQ/15 ML ORAL LIQUID
20 | Freq: Once | ORAL | Status: AC
Start: 2020-06-04 — End: 2020-06-04
  Administered 2020-06-04: 15:00:00 40 meq via ORAL

## 2020-06-04 MED ORDER — MULTIVIT-MINERALS-IRON 45 MG-FOLIC ACID 800 MCG-VIT K 120 MCG CAPSULE
45 | ORAL_CAPSULE | Freq: Every day | ORAL | 4 refills | Status: AC
Start: 2020-06-04 — End: ?

## 2020-06-04 MED ORDER — CELEBRATE CALCIUM CITRATE 500 MG + VITAMIN D3 500 UNIT SOFT CHEW
ORAL_TABLET | Freq: Two times a day (BID) | ORAL | 4 refills | Status: DC
Start: 2020-06-04 — End: 2020-10-24

## 2020-06-04 MED ORDER — OMEPRAZOLE 20 MG CAPSULE,DELAYED RELEASE
20 | ORAL_CAPSULE | Freq: Two times a day (BID) | ORAL | 5 refills | Status: DC
Start: 2020-06-04 — End: 2020-06-04

## 2020-06-04 MED ORDER — ACETAMINOPHEN 500 MG TABLET
500 mg | ORAL_TABLET | Freq: Three times a day (TID) | ORAL | 0 refills | Status: DC
Start: 2020-06-04 — End: 2020-09-17

## 2020-06-04 MED FILL — ACETAMINOPHEN 500 MG TABLET: 500 mg | ORAL | Qty: 2

## 2020-06-04 MED FILL — ESCITALOPRAM 10 MG TABLET: 10 mg | ORAL | Qty: 2

## 2020-06-04 MED FILL — POTASSIUM CHLORIDE 20 MEQ/15 ML ORAL LIQUID: 20 mEq/15 mL | ORAL | Qty: 30

## 2020-06-04 MED FILL — PANTOPRAZOLE 40 MG INTRAVENOUS SOLUTION: 4 mg/mL | INTRAVENOUS | Qty: 10

## 2020-06-04 MED FILL — OXYCODONE 5 MG TABLET: 5 mg | ORAL | Qty: 1

## 2020-06-04 MED FILL — GABAPENTIN 300 MG CAPSULE: 300 mg | ORAL | Qty: 2

## 2020-06-04 MED FILL — SENNA LAX 8.6 MG TABLET: 8.6 mg | ORAL | Qty: 2

## 2020-06-04 MED FILL — LIDOCAINE 5 % TOPICAL PATCH: 5 % | TOPICAL | Qty: 1

## 2020-06-04 NOTE — Discharge Instructions (Addendum)
Post Operation Instructions For Your Care at Home after Bariatric Surgery    During your hospital stay, you received blood products and now all your labs look stable. As we discussed, you are no longer going home with Lovenox injections due to bleeding risk. Please go for walks frequently to prevent/reduce blood clot risk after the surgery.     Follow-up Contacts  If you have questions about your condition in the next few weeks, please send your surgeon a [facility] My[facility] message.  This is the preferred method of communication. You can send messages to your surgeon, view your clinical lab results, request medication refills, request appointments, and more. If you need to sign up, go to [email or web address] and click Request an Account.  An activation code will be sent to you within 2 business days.    If you need urgent help, call [phone].  This number is answered at nights and on the weekends.    Contact your surgeon if you develop any of the following:    abdominal pain becomes much worse than when you left the hospital.   the area around your incisions becomes very red, increasingly tender, or begins to drain pus.  Small amounts of blood-tinged fluid and mild redness are common and are no cause for concern.   your temperature goes above 100.4 F (38 C).   persistent vomiting or diarrhea.   you feel as if you are getting more sick instead of well.   any thought that you should contact your other doctors or should report to an emergency room for something related to the operation. Call us instead.      Medications & Pain Control  When you leave the hospital you will be given prescriptions for several medications, some of which you will only take for a few months and others will be lifelong. These medications must be taken as prescribed and should be started within one to two days of arriving home. Please discuss the medications with your pharmacy before your operation to confirm that these are covered by  your insurance, or be prepared to pay for them yourself. Resume your usual medicines for chronic medical conditions, being sure to crush the larger solid pills.     You will be prescribed a medicine to help with pain control. Over time you will need less and less of the medicine, and you should gradually reduce the amount you take in the weeks after surgery. Do not take any form of non-steroidal anti-inflammatory drugs (NSAIDS)!     Immediately following surgery and for at least the following 6 months, you will be prescribed a medicine that decreases acid production by your stomach in order to decrease the risk of ulcers.  This medication is usually a proton pump inhibitor (PPI) such as omeprazole (Prilosec) capsules 20mg  daily. Your surgeon may recommend that you continue using an acid reduction medication for the rest of your life.    If you still have a gallbladder, you will be prescribed a medicine called Ursodiol (or equivalent) to help prevent the formation of gallstones. The dose is 300mg  twice a day.  This should be taken for 6 months after surgery.    Vitamins and Minerals are Required Daily After Surgery    ProCare Bariatric Complete Multivitamin (45mg  iron) capsule or chewtab: Take 1 capsule or chewtab by mouth daily at bedtime.    Celebrate Calcium Citrate 500mg  + Vitamin D 500unit softchew: Take 1 softchew by mouth twice daily with breakfast and dinner.  If you do not eat a diet rich in calcium, take one additional softchew with lunch daily.   Note that the ProCare Bariatric Complete Multivitamin is currently available for purchase online at [email or web address] and [email or web address]. Celebrate Calcium Citrate/Vit D softchews are available online at [email or web address] and [email or web address]. If you are unable to obtain our preferred regimen, please refer to our alternate regimen in your dietary information packet.     Activity  It is common to feel a little more tired than usual in the first couple of weeks after  surgery. This is to be expected, and you should listen to your body to determine the appropriate level of activity.     You may return to full activity, including work, when you feel ready.  After a laparoscopic operation this usually occurs within two weeks.   If you have laparoscopic incisions you may lift as much as you wish and return to more vigorous activities, such as sports, as soon as you feel up to it.   You may perform normal activities, such as walking up and down stairs, walking outside the house, doing chores about the house and riding in a car when you feel up to it.   You may drive after one week as long as long as you are not taking any pain medication.    Bowel Movements  You will likely have irregular bowel movements after bariatric surgery, but they will normalize over time.  If you have not had a bowel movement 4 days past your surgery, we recommend taking a dose of Milk of Magnesia. This will help for acute constipation by speeding up bowel motility.  To prevent constipation, we recommend adding powder fiber (Benefiber or Metamucil) with increased fluid intake, stool softner (Colace), smooth move tea, or small doses of prune juice.     Wound Care  You may take showers after your operation. Let the soap and water run over your incisions. If you had a gastric bypass, expect some drainage where the drain was removed.  This is normal and will resolve after a few days.    Follow-up Contacts  Your first postoperative appointment should be scheduled within 1-3 weeks of leaving the hospital. If you don't have an appointment scheduled, MyChart your surgeon's office.    It is recommended that you meet regularly with a dietitian and join a support group in your community to assist in your lifestyle adjustments.     Post Operative Follow-up Appointments  You will be seen by a member of our bariatric team via video visit.  If you wish to have a follow-up appointment with the dietitian, please let us  know so the visit can be scheduled accordingly:   13 weeks after discharge   3 months post-op   6 months post-op   12 months post-op   every year thereafter    Monitoring lab values is important to avoid complications of malnutrition. Labs should start with the 3 month visit, and be drawn about 2 weeks before your appointment so the results can be discussed with your surgeon.  If you are getting your labs drawn at your local hospital, bring the printed results with you to your appointment.  These labs are recommended:   Complete blood count   Complete metabolic panel   Lipid Panel   Iron panel   Folate, thiamine, copper, iron, ferritin, selenium, zinc, vitamin A, D, B12   Hemoglobin A1C   PTH     Approach to Diet After  Your Operation  The diet guidelines are designed to limit calories while providing a balanced meal plan to help prevent nutrient deficiencies and preserve muscle tissue. Tolerance and progression will vary greatly from patient to patient.    General Guidelines   Eat slowly and chew small bites of food thoroughly.   When you first start taking solids, avoid rice, bread, raw vegetables, and meats that are not easily chewed such as pork and steak. Ground meats are usually better tolerated.   Eat balanced meals with small portions.   Keep a daily record of your food portions, plus calorie and protein intake.   Concentrate on following a diet low in calories and sweets.   Avoid sugar, sugar-containing foods and beverages, concentrated sweets, and fruit juices.    Fluids  Drink extra water and low calorie or calorie-free fluids between meals to avoid dehydration. Sip about one cup of fluid between each small meal, 6 to 8 times a day. At least two liters (64 ounces or 8 cups) of fluid a day is recommended. You will gradually be able to meet this target.    We strongly warn against the use of any alcoholic beverages. Alcohol will get absorbed into your system much more quickly than before,  and the sedative and mood- altering affects are more difficult to predict and control.    Protein  Preserve muscle tissue by eating foods rich in protein. High protein foods are eggs, meats, fish, seafood, tuna, poultry, soy milk, tofu, cottage cheese, yogurt, and other milk products. Your goal should be a mimimum of 60-80 grams of protein per day. Do not worry if you cannot reach this goal in the first couple months after your operation.     Diet Progression After Bariatric Surgery  Please reference our "Diet after Bariatric Surgery" education guide for detailed directions regarding diet advancement after surgery.                               Sherlynn Carbon, MD  Barbara Cower, MD   Randalyn Rhea, MD

## 2020-06-04 NOTE — Discharge Summary (Addendum)
[facility] - DISCHARGE SUMMARY     Patient Name: Kayla Frey  Patient MRN: [id number]  Date of Birth: 1986/03/27    Facility: [facility]  Attending Physician: [place] Duff, MD    Date of Admission: 05/31/2020  Date of Discharge: 06/04/2020    Admission Diagnosis: Obesity  Discharge Diagnosis: Obesity s/p laparoscopic sleeve gastrectomy on 05/31/20     Discharge Disposition: Home    History (with Chief Complaint)    Kayla Frey is a 34 y.o. female history of anxiety, depression, trauma in childhood, and morbid obesity with a BMI of 68. She was taken to the OR on 05/31/2020 for elective laparoscopic sleeve gastrectomy with Dr. Montez Morita.    Brief Hospital Course by Problem    #s/p laparoscopic sleeve gastrectomy  Surgery was uncomplicated (please see details for operative report). Immediate post-op and 6-hour hemoglobin were stable @ 11.6 -> 12.3). Postoperative course was notable for symptomatic orthostatic hypotension, tachycardia (up to 120-160s) and hemoglobin drop 12 ->9 -> 6.3 on POD 1-2. Subcutaneous heparin injection held since POD 1. EKG showed sinus tachycardia. Patient was fluid resuscitated with 3L of crystalloids followed by total 3 units of pRBC over the course of POD 2-3. CT scan showed hematoma adjacent to the stapled greater curvature of the stomach. Overall, patient responded well with volume resuscitation. Tachycardia significantly improved on POD 3-4 (120s -> 80-90s), no longer orthostatic and hemoglobin remain stabilized (in 8s). Patient was able to ambulate, voided without difficulties, tolerated Bari stage 2 diet without bloating, nausea and vomiting. Received education from dietitian for bariatric diet. Due to bleeding risk, the decision was made for no Lovenox at discharge and instead the patient was encouraged to walk frequently at home. Patient is set up for 2-week follow up appointment with Bariatric PA.     No new Assessment & Plan notes have been filed under this hospital service  since the last note was generated.  Service: General Surgery    Physical Exam at Discharge  BP 135/72 (BP Location: Left upper arm, Patient Position: Lying)   Pulse 74   Temp 36.4 C (97.5 F) (Oral)   Resp 18   Ht 170.2 cm (5' 7.01")   Wt (!) 199.5 kg (439 lb 13.1 oz)   SpO2 99%   BMI 68.87 kg/m       Intake/Output Summary (Last 24 hours) at 06/04/2020 0913  Last data filed at 06/04/2020 0600  Gross per 24 hour   Intake 3036.68 ml   Output 3400 ml   Net -363.32 ml       Physical Exam  Cardiovascular:      Rate and Rhythm: Normal rate.      Pulses: Normal pulses.   Pulmonary:      Effort: Pulmonary effort is normal.   Abdominal:      Palpations: Abdomen is soft.      Tenderness: There is no abdominal tenderness.      Comments: Lap sites glued d/c/i   Skin:     General: Skin is warm.      Capillary Refill: Capillary refill takes less than 2 seconds.   Neurological:      General: No focal deficit present.      Mental Status: She is alert.   Psychiatric:         Mood and Affect: Mood normal.         Relevant Labs, Radiology, and Other Studies    Recent Labs     06/04/20  0617  06/03/20  1610 06/03/20  0439 06/02/20  1915 06/02/20  1255 06/02/20  0825 06/02/20  0435 06/01/20  1639 06/01/20  1154   WBC 11.3* 11.8* 10.4* 12.3* 13.4*  --  13.0* 19.4* 20.9*   HGB 8.2* 8.3* 6.9* 7.1* 6.9*  --  6.3* 7.8* 8.3*   HCT 25.5* 25.9* 22.1* 22.1* 21.6*  --  20.1* 24.4* 26.1*   PLT 232 242 212 210 222  --  229 307 351   NA 140  --  138  --   --  138  --   --  136   K 3.6  --  3.9  --   --  4.3  --   --  4.8   CL 104  --  103  --   --  104  --   --  103   CO2 28  --  28  --   --  28  --   --  25   BUN 11  --  10  --   --  11  --   --  17   CREAT 0.80  --  0.75  --   --  0.78  --   --  1.19*   GLU 93  --  89  --   --  105  --   --  165   CA 8.8  --  8.4  --   --  8.5  --   --  8.5   MG 2.2  --  2.1  --   --  2.1  --   --   --    PO4 4.1  --  2.8  --   --  2.2*  --   --   --      CT ABDOMEN/PELVIS WITH CONTRAST  06/03/2020 5:59 PM      CLINICAL HISTORY: s/p sleeve gastrectomy 05/31/2020  with ongoing RBC requirement looking for bleeding     COMPARISON:  None     TECHNIQUE: CT of the abdomen and pelvis was performed.      MEDICATIONS:  Iohexol 350 - 150 mL - Intravenous     FINDINGS:     Visualized lung bases:  Small left pleural effusion. Bilateral dependent consolidations could represent aspiration and/or infection.     Liver:  Unremarkable     Gallbladder: Cholelithiasis without evidence of cholecystitis     Spleen:  Unremarkable     Pancreas:  Unremarkable      Adrenal Glands:  Unremarkable     Kidneys:  Unremarkable     GI Tract:  Postoperative changes of sleeve gastrectomy with the collection adjacent to the greater curvature staples described below.     Vasculature:  Unremarkable     Lymphadenopathy: Absent     Peritoneum: Hyperdense peritoneal free fluid concerning for hemoperitoneum. Ill-defined hyperdense 8.1 x 9.4 x 10 cm collection adjacent to the stapled greater curvature of stomach (series 3 image 67, series 602 image 89),  concerning for hematoma.     Bladder: Unremarkable     Reproductive organs: Unremarkable     Bones:  No suspicious lesions     Extraperitoneal soft tissues: Unremarkable     Lines/drains/medical devices: None        RADIATION DOSE INDICATORS:  Exposure Events: 3 , CTDIvol Max: 30.2 mGy, DLP: 1782.6 mGy.cm     IMPRESSION:      1. Sleeve gastrectomy changes. Adjacent to the stapled greater curvature of the stomach  is an ill-defined 8.1 x 9.4 x 10 cm hyperdense collection concerning for hematoma.   2. Moderate volume hemoperitoneum.   3. Bilateral dependent consolidations could represent aspiration and/or infection.     //Impression 1 discussed with Maricela Bo, MD by Dr. Gloris Ham, MD (Radiology) on 06/03/2020 6:09 PM.//     Report dictated by: Ammie Dalton, MD, signed by: Raleigh Nation, MD  Department of Radiology and Biomedical Imaging    Procedures Performed and Complications  Operative Report -  [facility]     Surgeon(s) and Role:     * Georgia Duff, MD - Primary     * Roe Rutherford, MD - Fellow - Assisting     Date of Operation: 05/31/2020     PREOPERATIVE DIAGNOSIS:  Severe obesity.     POSTOPERATIVE DIAGNOSIS:  Severe obesity.     OPERATION:   1. Laparoscopic sleeve gastrectomy.  2. Bilateral rectus sheath block with Marcaine     ANESTHESIA:             General endotracheal     CLINICAL INDICATIONS:  This patient presented with severe obesity with Body mass index is 68.87 kg/m.     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       FINDINGS:  The sleeve was created and sized using a 40-French ViSiGi bougie.  The sleeve started 6 cm proximal to the pylorus.       DETAILED DESCRIPTION OF PROCEDURE:    The patient was taken to the operative suite and placed in the supine position and a general anesthetic administered.  Both arms were placed on arm boards.  All pressure points were carefully assessed and padded.  Abdominal hair was clipped as needed. The patient's abdomen was then sterilely prepped and draped in the usual fashion.  SCDs were placed and turned on. Heparin was given subcutaneously for DVT prophylaxis.  Appropriate antibiotics were given intravenously for wound prophylaxis.  A time-out was performed.     We gained entry into the abdominal cavity using a Veres needle at Palmer's point.  The Veres needle was inserted so that the tip was just within the abdominal cavity.  The abdomen was then insufflated to a pressure of 15 mmHg.     We next made a transverse incision just above and to the left of the patient's umbilicus measuring 11 mm in  length.  Using an optical trocar and a 0 degree scope, we inserted the trocar into the insufflated abdomen under direct vision.  We then traded out our gas supply to that port.  We traded out our scope for a 10-30 scope.  We then inserted it and looked at the abdomen.     I first looked at the Veres needle entry site.  The Veres was removed. There was no significant injury to the underlying viscera.  We next looked at the abdominal cavity.  The visualized portions of the liver, colon,  and small bowel appeared normal, as did the stomach.     We next placed our working ports.  We made a a 5 mm subxyphoid incision, followed by a 5 mm and 15 mm port in the right paramedian location centered 10 and 20 cm from the xiphoid.  We placed another 5 mm port in the left mid abdomen and a 5 mm port in the left subcostal space.  All these were placed under direct vision.     We inserted a toothed grasper through the subxiphoid port and used it to retract the left lateral segment of the liver off the anterior surface of the stomach by grasping the diaphragm just anterior to the gastroesophageal juction.  This provided excellent exposure of the lesser curve and the anterior wall of the stomach.  We then passed the ViSiGi suction/bougie device.  It was positioned along the lesser curve and suction applied.     We measured a point 6 cm proximal to the pylorus and we marked the greater curvature at this point.  Then we gained entry into the lesser sac using the 5mm LigaSure.  We walked around the greater curvature of the stomach, taking down the terminal branches of the right gastroepiploic vessels and then the short gastric vessels until we completely freed the stomach fundus away from the spleen.  We also dissected the fundus back to the left crus of the diaphragm, so that we could see the phrenoesophageal ligament.  The posterior attachments were then taken down all the way to the lesser curvature, dividing any posterior gastric  vessels that were present.  This completely mobilized the stomach.       Starting 6 cm proximal to the pylorus, we next sleeved the stomach using first an endoGIA 45mm black (5-4-39mm) load, and then sequential fires of the EndoGIA 45 or 60 black stapler.  We did this using the bougie as a guide but we took care not to stretch the stomach tightly over the bougie, particularly at the incisura.  We took this up all the way to the His angle, staying just off the fat pad and His angle at the phenoesophageal junction.  All the staples were inspected after each firing and well-formed.     Once freed, we took the greater curvature of the stomach and placed it into a 15 mm Endocatch bag.  It was then pulled out through the 15 mm port site and sent to the pathologist.     Next, we suctioned along the staple line and bleeding points were cauterized as needed. Our next step was to remove the ViSiGi.      We next performed omentopexy. We reattached the sleeved stomach to the cut edge of the gastrocolic omentum with interrupted 2-0 polysorb sutures. We next used fibrin glue and squirted along the staple line for hemostasis.     The fascia at the 15 and 11 mm port sites was closed with interrupted #1 Vicryl sutures.      I next performed a bilateral rectus sheath block with Marcaine.  This was done by passing a spinal needle around the circumference of the closed 15mm and 11mm port sites and instilling local anesthetic into the rectus sheath under direct vision all along the periphery of the closed fascia.     The abdomen was desufflated and the remaining ports removed.  The wound edges were then washed out.  Bleeding points were cauterized.  The skin edges were closed with 4-0 Biosyn.  Indermil was applied as a sterile dressing.     The patient tolerated the procedure well, was extubated in the operating room, and taken to the recovery room in stable condition.  Sponge, needle, and instrument counts were correct at the end of the  case.     Estimated Blood Loss: minimal     Pathology: greater curvature of stomach     Condition at end of operation: stable     Disposition and Plan: care per standard bariatric pathway.     The primary surgeon (listed above) was scrubbed and present throughout the entire surgical procedure. If the assistant surgeon was other than a qualified resident, I certify that the services were medically necessary and there was no qualified resident available to perform the services.         DISCHARGE INSTRUCTIONS    Discharge Diet  Other: Bariatric stage 2    Functional Assessment at Discharge/Activity Goals  No functional activity limits.    Allergies and Medications at Discharge    Allergies: Iodine, Shellfish derived, Tree nuts, and Morphine    Your Medications at the End of This Hospitalization               acetaminophen (TYLENOL) 500 mg tablet Take 2 tablets (1,000 mg total) by mouth 3 (three) times daily . Crush tablets before taking for 3 months.    biotin 10,000 mcg TABRAPID Take 1 tablet by mouth    celebrate calcium citrate + vitamin D3 (CELEBRATE) soft chew Chew 1 tablet by mouth 2 (two) times daily with meals . Take separately from multivitamin.    clotrimazole (LOTRIMIN) 1 % cream Apply topically Twice a day Use as instructed    EPINEPHrine (EPIPEN) 0.3 mg/0.3 mL injection Inject 0.3 mL (0.3 mg total) into the muscle once as needed for Anaphylaxis for up to 1 dose Use as instructed    ergocalciferol, vitamin D2, (ERGOCALCIFEROL) 50,000 unit capsule Take 1 capsule (50,000 Units total) by mouth every 7 (seven) days for 112 days Use as instructed    escitalopram oxalate (LEXAPRO) 20 mg tablet Take 1 tablet (20 mg total) by mouth every morning .crush tablet before taking for 3 months.    gabapentin (NEURONTIN) 300 mg capsule Take 2 capsules (600 mg total) by mouth nightly at bedtime For 7 days.    lactobacillus combo no.13 (PROBIOTIC PEARLS COMPLETE ORAL) Take by mouth 1 capsule once daily     multivitamin-min-iron-FA-vit K (PROCARE BARIATRIC) 45 mg iron- 800 mcg-120 mcg capsule Take 1 capsule by mouth nightly at bedtime    omeprazole (PRILOSEC) 20 mg capsule Take 2 capsules (40 mg total) by mouth in the morning and at bedtime 30 minutes before breakfast for 6 months.    oxyCODONE (ROXICODONE) 5 mg tablet Take 1 tablet (5 mg total) by mouth every 4 (four) hours as needed for Pain Not controlled by Tylenol. Crush tablets before taking.    ursodioL (ACTIGALL) 300 mg capsule Take 1 capsule (300 mg total) by mouth 2 (two) times daily For 6 months.          Immunizations Administered for This Admission       Name Date    Influenza    Deferred  ()                 Pending Tests   Pending Labs       Order Current Status    COVID-19 RNA, RT-PCR/Nucleic Acid Amplification RETEST (Asymptomatic and No Specific COVID-19 Suspicion);  No; No; No Collected (06/03/20 0006)              Outside Follow-up           Booked [facility] Appointments  Future Appointments   Date Time Provider Department Center   06/13/2020 10:00 AM Ronaldo MiyamotoSarah Grace [place], [place] [id number]   07/04/2020  1:45 PM Cottie BandaJessica Alcalay Erickson, MD [facility]       Pending [facility] Referrals  None    Case Management Services Arranged  Case Management Services Arranged: (all recorded)             Discharge Assessment  Condition at discharge:  good             Covid-19 Vaccines       No immunizations on file.                Primary Care Physician  Cottie BandaJessica Alcalay Erickson  Address: [address] / [place] [id number]   Phone: [phone]  Fax: None     Outside Providers, for pending tests please use the following numbers:   For [facility] - Please Call: [phone]    For [facility] - Please Call: [phone]   For [facility] - Please Call: [phone]    Signed,  Chilton GreathouseJangmi Kiannah Grunow, NP  General Surgery  06/04/20              Discharge Instructions provided to the patient (if any):    Discharge Instructions        Post Operation Instructions For Your Care at Home after Bariatric Surgery    During your hospital stay, you received blood products and now all your labs look stable. As we discussed, you are no longer going home with Lovenox injections due to bleeding risk. Please go for walks frequently to prevent/reduce blood clot risk after the surgery.     Follow-up Contacts  If you have questions about your condition in the next few weeks, please send your surgeon a [facility] MyChart message.  This is the preferred method of communication. You can send messages to your surgeon, view your clinical lab results, request medication refills, request appointments, and more. If you need to sign up, go to [email or web address] and click Request an Account.  An activation code will be sent to you within 2 business days.    If you need urgent help, call [phone].  This number is answered at nights and on the weekends.    Contact your surgeon if you develop any of the following:   abdominal pain becomes much worse than when you left the hospital.  the area around your incisions becomes very red, increasingly tender, or begins to drain pus.  Small amounts of blood-tinged fluid and mild redness are common and are no cause for concern.  your temperature goes above 100.4 F (38 C).  persistent vomiting or diarrhea.  you feel as if you are getting more sick instead of well.  any thought that you should contact your other doctors or should report to an emergency room for something related to the operation. Call us instead.      Medications & Pain Control  When you leave the hospital you will be given prescriptions for several medications, some of which you will only take for a few months and others will be lifelong. These medications must be taken as prescribed and should be started within one to two days of  arriving home. Please discuss the medications with your pharmacy before your operation to confirm that these are covered by your  insurance, or be prepared to pay for them yourself. Resume your usual medicines for chronic medical conditions, being sure to crush the larger solid pills.     You will be prescribed a medicine to help with pain control. Over time you will need less and less of the medicine, and you should gradually reduce the amount you take in the weeks after surgery. Do not take any form of non-steroidal anti-inflammatory drugs (NSAIDS)!     Immediately following surgery and for at least the following 6 months, you will be prescribed a medicine that decreases acid production by your stomach in order to decrease the risk of ulcers.  This medication is usually a proton pump inhibitor (PPI) such as omeprazole (Prilosec) capsules  daily. Your surgeon may recommend that you continue using an acid reduction medication for the rest of your life.    If you still have a gallbladder, you will be prescribed a medicine called Ursodiol (or equivalent) to help prevent the formation of gallstones. The dose is  twice a day.  This should be taken for 6 months after surgery.    Vitamins and Minerals are Required Daily After Surgery   ProCare Bariatric Complete Multivitamin (  iron) capsule or chewtab: Take 1 capsule or chewtab by mouth daily at bedtime.   Celebrate Calcium Citrate  + Vitamin D 500unit softchew: Take 1 softchew by mouth twice daily with breakfast and dinner.     If you do not eat a diet rich in calcium, take one additional softchew with lunch daily.   Note that the ProCare Bariatric Complete Multivitamin is currently available for purchase online at [email or web address] and [email or web address]. Celebrate Calcium Citrate/Vit D softchews are available online at [email or web address] and [email or web address]. If you are unable to obtain our preferred regimen, please refer to our alternate regimen in your dietary information packet.     Activity  It is common to feel a little more tired than usual in the first couple of weeks after surgery.  This is to be expected, and you should listen to your body to determine the appropriate level of activity.    You may return to full activity, including work, when you feel ready.  After a laparoscopic operation this usually occurs within two weeks.  If you have laparoscopic incisions you may lift as much as you wish and return to more vigorous activities, such as sports, as soon as you feel up to it.  You may perform normal activities, such as walking up and down stairs, walking outside the house, doing chores about the house and riding in a car when you feel up to it.  You may drive after one week as long as long as you are not taking any pain medication.    Bowel Movements  You will likely have irregular bowel movements after bariatric surgery, but they will normalize over time.  If you have not had a bowel movement 4 days past your surgery, we recommend taking a dose of Milk of Magnesia. This will help for acute constipation by speeding up bowel motility.  To prevent constipation, we recommend adding powder fiber (Benefiber or Metamucil) with increased fluid intake, stool softner (Colace), smooth move tea, or small doses of prune juice.     Wound Care  You may take showers after your operation. Let the soap and water run over your incisions. If you had  a gastric bypass, expect some drainage where the drain was removed.  This is normal and will resolve after a few days.    Follow-up Contacts  Your first postoperative appointment should be scheduled within 1-3 weeks of leaving the hospital. If you don't have an appointment scheduled, MyChart your surgeon's office.    It is recommended that you meet regularly with a dietitian and join a support group in your community to assist in your lifestyle adjustments.     Post Operative Follow-up Appointments  You will be seen by a member of our bariatric team via video visit.  If you wish to have a follow-up appointment with the dietitian, please let us know so the visit  can be scheduled accordingly:  13 weeks after discharge  3 months post-op  6 months post-op  12 months post-op  every year thereafter    Monitoring lab values is important to avoid complications of malnutrition. Labs should start with the 3 month visit, and be drawn about 2 weeks before your appointment so the results can be discussed with your surgeon.  If you are getting your labs drawn at your local hospital, bring the printed results with you to your appointment.  These labs are recommended:  Complete blood count  Complete metabolic panel  Lipid Panel  Iron panel  Folate, thiamine, copper, iron, ferritin, selenium, zinc, vitamin A, D, B12  Hemoglobin A1C  PTH     Approach to Diet After Your Operation  The diet guidelines are designed to limit calories while providing a balanced meal plan to help prevent nutrient deficiencies and preserve muscle tissue. Tolerance and progression will vary greatly from patient to patient.    General Guidelines  Eat slowly and chew small bites of food thoroughly.  When you first start taking solids, avoid rice, bread, raw vegetables, and meats that are not easily chewed such as pork and steak. Ground meats are usually better tolerated.  Eat balanced meals with small portions.  Keep a daily record of your food portions, plus calorie and protein intake.  Concentrate on following a diet low in calories and sweets.  Avoid sugar, sugar-containing foods and beverages, concentrated sweets, and fruit juices.    Fluids  Drink extra water and low calorie or calorie-free fluids between meals to avoid dehydration. Sip about one cup of fluid between each small meal, 6 to 8 times a day. At least two liters (64 ounces or 8 cups) of fluid a day is recommended. You will gradually be able to meet this target.    We strongly warn against the use of any alcoholic beverages. Alcohol will get absorbed into your system much more quickly than before, and the sedative and mood- altering affects are more  difficult to predict and control.    Protein  Preserve muscle tissue by eating foods rich in protein. High protein foods are eggs, meats, fish, seafood, tuna, poultry, soy milk, tofu, cottage cheese, yogurt, and other milk products. Your goal should be a mimimum of 60-80 grams of protein per day. Do not worry if you cannot reach this goal in the first couple months after your operation.     Diet Progression After Bariatric Surgery  Please reference our "Diet after Bariatric Surgery" education guide for detailed directions regarding diet advancement after surgery.                               Sherlynn Carbon, MD  Barbara Cower, MD   Randalyn Rhea, MD              Patient Instructions    None

## 2020-06-04 NOTE — Result Quicknote (Signed)
I reviewed the recent tests. There is nothing concerning about the results.

## 2020-06-04 NOTE — Plan of Care (Signed)
Problem: Anxiety - Perianesthesia Care  Goal: Minimize anxiety  Outcome: Adequate for Discharge     Problem: Hemodynamic Instability - Perianesthesia Care  Goal: Maintain / promote hemodynamic stability  Outcome: Adequate for Discharge     Problem: Discharge Planning - Adult  Goal: Knowledge of and participation in plan of care  Outcome: Adequate for Discharge  Goal: CM / SW Coordination of safe discharge plan  Outcome: Adequate for Discharge     Problem: Pain,  Acute / Chronic- Adult  Goal: Control of acute pain  Outcome: Adequate for Discharge  Goal: Involvement in pain management plan  Outcome: Adequate for Discharge     Problem: Fall, at Risk or Actual - Adult  Goal: Absence of falls and fall related injury  Outcome: Adequate for Discharge  Goal: Knowledge of fall prevention  Outcome: Adequate for Discharge

## 2020-06-12 NOTE — Telephone Encounter (Signed)
Spoke with patient.  Will continue to monitor.  No other events since this AM.  No fevers, chills, dizziness, blood in her stool, etc.  She is constipated and taking mag citrate now.  I'll see her tomorrow for her post-op visit.    Dixon Boos, PA-C  [facility]   [phone]

## 2020-06-12 NOTE — Telephone Encounter (Signed)
Patient called today,  When she woke up in the morning she notice she had blood around her mount and tounge. She's not sure if this is normal if not what can she do?  Best call back number is [phone]    S/P LAPAROSCOPIC SLEEVE GASTRECTOMY FOR WEIGHT LOSS 10/7 with Dr. Montez Morita

## 2020-06-13 ENCOUNTER — Ambulatory Visit
Admit: 2020-06-13 | Payer: MEDICAID | Attending: Physician Assistant | Primary: Student in an Organized Health Care Education/Training Program

## 2020-06-13 DIAGNOSIS — Z9884 Bariatric surgery status: Secondary | ICD-10-CM

## 2020-06-13 NOTE — Progress Notes (Signed)
HPI  I saw Kayla Frey today in the [facility]. 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     She is doing well on a primarily liquid diet without significant nausea, vomiting, or dysphagia. She reports no symptoms of dehydration.  She has had no clinical symptoms of leak, stricture, intestinal obstruction or marginal ulceration. She will be adding solid food to the diet over the coming weeks.  I reviewed with the patient their caloric goals, protein goals, and need to minimize carbohydrates (especially simple sugars and high glycemic index foods).    The patient has not had any readmissions, reoperations, or interventions related to bariatric surgery since discharge from the hospital.  Her initial post-op period was complicated by a delayed staple line bleed causing an acute blood loss anemia requiring blood transfusion.  Not fevers, chills since she has been home.  She had an isolated episode of dried blood in her mouth after waking up yesterday, but no occurrences since.       Tolerating cottage cheese, scrambled eggs.  Doesn't like the texture of greek yogurt.  She's taking the multivitamin chews.  She hasn't needed the tylenol or oxycodone or tylenol. She took some mag citrate to help with constipation.     Medications and Vitamins  I reviewed and changed her medications and vitamins supplements today as needed, which are:  Current Medications       Dosage    biotin 10,000 mcg TABRAPID Take 1 tablet by mouth    celebrate calcium citrate + vitamin D3 (CELEBRATE) soft chew Chew 1 tablet by mouth 2 (two) times daily with meals . Take separately from multivitamin.    clotrimazole (LOTRIMIN) 1 % cream Apply topically Twice a day Use as instructed    EPINEPHrine (EPIPEN) 0.3 mg/0.3 mL injection Inject 0.3 mL (0.3 mg total) into the muscle once as needed for Anaphylaxis for up to 1 dose Use as instructed    ergocalciferol, vitamin D2,  (ERGOCALCIFEROL) 50,000 unit capsule Take 1 capsule (50,000 Units total) by mouth every 7 (seven) days for 112 days Use as instructed    escitalopram oxalate (LEXAPRO) 20 mg tablet Take 1 tablet (20 mg total) by mouth every morning .crush tablet before taking for 3 months.    lactobacillus combo no.13 (PROBIOTIC PEARLS COMPLETE ORAL) Take by mouth 1 capsule once daily    multivitamin-min-iron-FA-vit K (PROCARE BARIATRIC) 45 mg iron- 800 mcg-120 mcg capsule Take 1 capsule by mouth nightly at bedtime    omeprazole (PRILOSEC) 20 mg capsule GENERIC FOR PRILOSEC. TAKE 1 CAPSULE(20 MG) BY MOUTH TWICE DAILY    oxyCODONE (ROXICODONE) 5 mg tablet Take 1 tablet (5 mg total) by mouth every 4 (four) hours as needed for Pain Not controlled by Tylenol. Crush tablets before taking.    ursodioL (ACTIGALL) 300 mg capsule Take 1 capsule (300 mg total) by mouth 2 (two) times daily For 6 months.    acetaminophen (TYLENOL) 500 mg tablet Take 2 tablets (1,000 mg total) by mouth 3 (three) times daily . Crush tablets before taking for 3 months.    gabapentin (NEURONTIN) 300 mg capsule Take 2 capsules (600 mg total) by mouth nightly at bedtime For 7 days.        Physical Exam   Vitals: Ht 170.2 cm (5' 7.01")   Wt (!) 189.6 kg (418 lb)   LMP  (LMP Unknown)   BMI 65.45 kg/m   Body mass index is  65.45 kg/m.  Constitutional: She is oriented to person, place, and time. She appears well-developed and well-nourished.  Abdominal: Laparoscopic incisions are clean, dry, intact, without hematoma, seroma, or soft tissue infection.    Assessment and Plan  This is a 34 y.o. female doing well after bariatric surgery.  I would like to see the patient again in 3 months.  I have given the patient a list of laboratory work to be drawn about 2 weeks beforehand so that I can review the results with the patient at the follow-up appointment.  Labs to be completed at [facility]  I will keep you apprised of her progress.    General care after bariatric surgery:   Daily Water Requirements  Daily water goals for includes 64 oz minimum water intake (1-2 Liters) per day.  ?  Daily Multivitamins  It is very important for patients to take a daily high potency multi vitamin for life. We recommend Bariatric Procare Multivitamin with 45mg  of iron (chewable or capsule) once daily (capsules can be swallowed whole). This is available only online without need of a prescription at [email or web address]  ?  Daily Calcium Supplementation  Patients should take 1200-1500 mg daily of calcium citrate for life. It is best to take calcium supplementation in small doses throughout the day for best absorption. There are many forms available including: tablet, chewable, soft chew, gummies etc. It is recommended that patients find a calcium citrate brand that they enjoy, that is calorie free or very low calorie. These are available at any pharmacy or online without the need of a prescription. We recommend trying the Celebrate brand Calcium citrate soft chews available at [email or web address]  ?  Daily Protein Requirements  Goal should be protein intake between 60-80 grams per day. Some options include protein shakes or powders, lean meats ([facility], fish, chicken), tofu, etc. In addition patients need green vegetable intake avoid sugars, carbohydrates, starches, pasta, rice, bread, and potatoes.  ?  Calorie Goals - Weight Loss Phase  Daily Calorie Goal of 500-700 total per day, focusing on lean proteins and leafy green vegetables during the weight loss phase, until patient have reached their desired goal weight.  ?  Calorie Goals - Weight Maintenance Phase  Daily calorie goal of around 1000 per day (+/- 100-200 calories) once patients have achieved their weight loss goal. Pateints should continue to count and log calories for life even when at goal weight to maintain desired weight. Patients may need to adjust calories up or down to maintain their weight at goal over time.    Tips for patients:  1. Identify  a set time and place to count calories - make this process a daily habit.   2. Using calorie counting aids (web-based: www.fitday.com) can help.   3. Use a personal food space at home to help concentrate on food types that others in the household may not be eating.  4. Advance meal planning using a daily / weekly spreadsheet.   5. Time your meals to less than 10 minutes per meal (eg. use an egg timer if necessary)  6. Attendance at a support group is highly encouraged. The [facility] Support Group meets the 3rd Wednesday of every month at 6pm - the location for this meeting may change depending on availability, and will be identified online at our website or by email. Patients should feel free to join this group any time and bring friends or family members. To add an email to the support group mailing  list, send request to: [email or web address].   ?  For more information, please visit our website: [email or web address]     Medications  After gastric bypass, patients should never take NSAIDS because of the risk of marginal ulceration.  After sleeve gastrectomy, NSAIDS can be taken after healing has occurred, typically 3-6 months after surgery.  ?  Smoking cigarettes should never occur after bariatric surgery.     Minimal alcohol intake is acceptable 3 months after surgery, but any alcohol (wine or beer) intake should be limited to 2oz. / 8 hours, as the effect of alcohol will be more pronounced. Gastric bypass patients are at increased risk for alcoholism.    Documentation of Visit Statement  I performed this consultation using real-time Telehealth tools, including a live video connection between my location and the patient's location. Prior to initiating the consultation, I obtained informed verbal consent to perform this consultation using Telehealth tools and answered all the questions about the Telehealth interaction.  ?  This is an independent service.  The available  consultant for this service is Georgia Duff, MD.         Dixon Boos, PA-C  [facility]   [phone]

## 2020-07-06 LAB — ECG 12-LEAD
Atrial Rate: 111 {beats}/min
Atrial Rate: 111 {beats}/min
Calculated P Axis: 43 degrees
Calculated P Axis: 54 degrees
Calculated R Axis: 40 degrees
Calculated R Axis: 42 degrees
Calculated T Axis: 30 degrees
Calculated T Axis: 32 degrees
P-R Interval: 126 ms
P-R Interval: 130 ms
QRS Duration: 76 ms
QRS Duration: 84 ms
QT Interval: 308 ms
QT Interval: 316 ms
QTcb: 418 ms
QTcb: 429 ms
Ventricular Rate: 111 {beats}/min
Ventricular Rate: 111 {beats}/min

## 2020-09-17 ENCOUNTER — Ambulatory Visit
Payer: MEDICAID | Attending: Physician Assistant | Primary: Student in an Organized Health Care Education/Training Program

## 2020-09-17 DIAGNOSIS — L304 Erythema intertrigo: Secondary | ICD-10-CM

## 2020-09-17 MED ORDER — ECONAZOLE 1 % TOPICAL CREAM
1 | TOPICAL | 1 refills | Status: DC
Start: 2020-09-17 — End: 2020-12-04

## 2020-09-17 MED ORDER — NYSTATIN 100,000 UNIT/GRAM TOPICAL POWDER
100000 | Freq: Three times a day (TID) | TOPICAL | 2 refills | Status: DC
Start: 2020-09-17 — End: 2021-01-18

## 2020-09-17 NOTE — Progress Notes (Signed)
I performed this consultation using real-time Telehealth tools, including a live video connection between my location and the patient's location. Prior to initiating the consultation, I obtained informed verbal consent to perform this consultation using Telehealth tools and answered all the questions about the Telehealth interaction.    HPI  I saw Kayla Frey today in the [facility]. This is her 3 month follow up.  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    She is enjoying a wide range of food types without significant nausea, vomiting, or dysphagia.  She has had no clinical symptoms of intestinal obstruction or marginal ulceration.  The patient has not had any readmissions, reoperations, or interventions related to their bariatric surgery since their last office visit.  She is taking a daily bariatric multivitamin:  Yes.  She is taking a daily calcium + vitamin D supplement:  Yes.    She has not smoked since 12/30/2019.      Weight Loss and Diet  Today's (!) 176.4 kg (389 lb), giving a calculated body-mass index of Body mass index is 60.93 kg/m. her preoperative weight was 439 pounds. Her goal weight is 220 pounds.     Her weight history is as follows:   Wt Readings from Last 10 Encounters:   09/17/20 (!) 176.4 kg (389 lb)   06/13/20 (!) 189.6 kg (418 lb)   05/31/20 (!) 199.5 kg (439 lb 13.1 oz)   05/24/20 (!) 198.5 kg (437 lb 9.6 oz)   02/08/20 (!) 193.2 kg (426 lb)   01/17/20 (!) 186.4 kg (411 lb)   01/16/20 (!) 186.4 kg (411 lb)   12/20/19 (!) 181.4 kg (400 lb)   12/07/19 (!) 181.4 kg (400 lb)   11/15/19 (!) 181.4 kg (400 lb)     Obesity-related Co-morbidity Resolution  Before bariatric surgery, her obesity-related co-morbidities were:  Diabetes - Never Diagnosed  Hypertension - Never Diagnosed  Hyperlipidemia - Never Diagnosed  Obstructive Sleep Apnea - Never Diagnosed   OSA on CPAP -- No  GERD - Never Diagnosed    Nutritional and  Metabolic Laboratory Assessment  I reviewed the patient's nutritional and metabolic labs today:  Need to be completed     Medications and Vitamins  I reviewed and changed her medications and vitamins supplements today as needed, which are:  Current Medications       Dosage    biotin 10,000 mcg TABRAPID Take 1 tablet by mouth    celebrate calcium citrate + vitamin D3 (CELEBRATE) soft chew Chew 1 tablet by mouth 2 (two) times daily with meals . Take separately from multivitamin.    escitalopram oxalate (LEXAPRO) 20 mg tablet Take 20 mg by mouth every morning .crush tablet before taking for 3 months.      multivitamin-min-iron-FA-vit K (PROCARE BARIATRIC) 45 mg iron- 800 mcg-120 mcg capsule Take 1 capsule by mouth nightly at bedtime    clotrimazole (LOTRIMIN) 1 % cream Apply topically Twice a day Use as instructed    econazole nitrate 1 % cream Apply twice daily to area on abdomen for 7-14 days.    EPINEPHrine (EPIPEN) 0.3 mg/0.3 mL injection Inject 0.3 mL (0.3 mg total) into the muscle once as needed for Anaphylaxis for up to 1 dose Use as instructed    nystatin (MYCOSTATIN) powder Apply topically 3 (three) times daily        Physical Exam   Vitals: Ht 170.2 cm (5\' 7" )   Wt )  176.4 kg (389 lb)   BMI 60.93 kg/m   Constitutional: oriented to person, place, and time.  Skin: well-healed port site incisions without infection, hernia, or seroma.    Assessment and Plan  This is a 35 y.o. female doing well after bariatric surgery.  She will go do labs in the next 1-2 weeks.  I will keep her posted via mychart about the results.  I would like to see the patient again in 3 months. Standing lab orders are in place through [facility].  I will keep you apprised of her progress.    Rxs for treatment of intertrigo sent to pharmacy. Recommend starting with antifungal powder daily and using topical antifungal cream prn when symptoms are poorly controled.    Counseled patient about telogen effluvium, which occurs due to rapid weight  loss.  Expect symptoms to improve in the next 3-6 months.      Goals after bariatric surgery:  - Patients should strive for a healthy BMI with as best resolution / prevention of obesity related comorbid diseases as possible.  ?  Daily Water Requirements:  - Daily water goals for the standard patient including 64 oz minimum water intake (1-2 Liters) some medical conditions may require careful balancing of water intake to avoid health complications, please refer to your specialists as needed to adjust water intake adequately to maintain hydration but minimize risk of exacerbation of chronic disease.  ?  Daily Multi Vitamins:  - It is very important to take a daily high potency multi vitamin for life. The [facility] team recommends the Bariatric Pro Care Multi Vitamin with 45mg  of iron (chewable or capsule) once daily (capsules can be swallowed whole).  - This is available only online without need of a prescription at [email or web address]  ?  Daily Calcium Supplementation:  - You should take 1200-1500 mg daily of calcium citrate for life. It is best to take your calcium supplementation in small doses throughout the day for best absorption. There are many forms available including: tablet, chewable, soft chew, gummies etc. It is recommended that patients find a calcium citrate brand that they enjoy, that is calorie free or very low calorie. These are available at any pharmacy or online without the need of a prescription. We recommend trying the Celebrate brand Calcium citrate soft chews available at [email or web address]  ?  Daily Protein Requirements:  - Goal should be protein intake between 60-80 grams per day. Some options to help you reach your daily goals including protein shakes or powders, lean meats ([place], fish, chicken), tofu, etc. In addition you will need green vegetable intake to maintain adequate nutrition and you should avoid sugars, carbohydrates, starches, pasta, rice, bread, and  potatoes.  ?  Calorie Goals - Weight Loss Phase:  - Daily Calorie Goal of 500-700 total per day, focusing on lean proteins and leafy green vegetables during the weight loss phase or until you have reached your desired goal weight.  ?  Calorie Goals - Weight Maintenance Phase:  - Daily Calorie Goal of around 1000 per day (+/- 100-200 calories) once you have achieved your weight loss goal. You should continue to count and log calories for life even when at goal weight to maintain desired weight. You may need to adjust your calories up or down to maintain their weight at goal over time.  ?  Medications:  - Please check with your PCP or specialists for medication adjustments and refills as needed after bariatric surgery. The only medications that  you should NOT take for life after bariatric surgery are the Non Steroidal Anti Inflammatory Drugs (NSAIDs). These include Asprin (unless prescribed by a cardiologist), Advil, Aleve, Naproxen, Motrin, and Ibuprofen, etc.  ?  Smoking cigarettes should never occur after bariatric surgery. Minimal alcohol intake is acceptable 3 months after surgery, but any alcohol (wine or beer) intake should be limited to 2oz. / 8 hours, as the effect of alcohol will be more pronounced.   ?  We recommend that you continue to carefully count and restrict calories according to the postoperative bariatric surgery protocol and your nutritionist-specific recommendations. Caloric intake should be recorded separately either online or in a bariatric surgery journal for daily review. Daily calorie counting is imperative for successful and long-term weight loss, and can employ the following tips:  - Identify a set time and place to count calories - make this process a daily habit.   - Using calorie counting aids (web-based: www.fitday.com) can help.   - Use a personal food space at home to help concentrate on food types that others in the household may not be eating.  - Advance meal planning using a daily  / weekly spreadsheet.   - Time your meals to less than 10 minutes per meal (eg. use an egg timer if necessary)     Also, attendance at a support group is highly encouraged. The [facility] Support Group meets the 3rd Wednesday of every month at 5pm - the sessions are currently being held virtually due to COVID-19.  Please join our email list to receive the Zoom information each month. Patients should feel free to join this group any time and bring friends or family members. To add an email to the support group mailing list, send request to: [email or web address].     I spent a total of 31 minutes on this patient's care on the day of their visit excluding time spent related to any billed procedures. This time includes time spent with the patient as well as time spent documenting in the medical record, reviewing patient's records and tests, obtaining history, placing orders, communicating with other healthcare professionals, counseling the patient, family, or caregiver, and/or care coordination for the diagnoses above.    This is an independent service.  The available consultant for this service is Georgia Duff, MD.       Dixon Boos, PA-C   [facility]: Bariatrics   [phone]

## 2020-10-24 ENCOUNTER — Ambulatory Visit: Admit: 2020-10-24 | Payer: MEDICAID | Primary: Student in an Organized Health Care Education/Training Program

## 2020-10-24 ENCOUNTER — Ambulatory Visit
Admit: 2020-10-24 | Discharge: 2020-10-30 | Payer: MEDICAID | Attending: Student in an Organized Health Care Education/Training Program | Primary: Student in an Organized Health Care Education/Training Program

## 2020-10-24 DIAGNOSIS — Z9884 Bariatric surgery status: Secondary | ICD-10-CM

## 2020-10-24 LAB — COMPREHENSIVE METABOLIC PANEL
AST: 13 U/L (ref 5–44)
Alanine transaminase: 8 U/L — ABNORMAL LOW (ref 10–61)
Albumin, Serum / Plasma: 3.7 g/dL (ref 3.5–5.0)
Alkaline Phosphatase: 71 U/L (ref 38–108)
Anion Gap: 8 (ref 4–14)
Bilirubin, Total: 0.5 mg/dL (ref 0.2–1.2)
Calcium, total, Serum / Plasma: 9.1 mg/dL (ref 8.4–10.5)
Carbon Dioxide, Total: 26 mmol/L (ref 22–29)
Chloride, Serum / Plasma: 106 mmol/L (ref 101–110)
Creatinine: 0.85 mg/dL (ref 0.55–1.02)
Glucose, non-fasting: 86 mg/dL (ref 70–199)
Potassium, Serum / Plasma: 4 mmol/L (ref 3.5–5.0)
Protein, Total, Serum / Plasma: 6.9 g/dL (ref 6.3–8.6)
Sodium, Serum / Plasma: 140 mmol/L (ref 135–145)
Urea Nitrogen, Serum / Plasma: 13 mg/dL (ref 7–25)
eGFR - high estimate: 104 mL/min (ref >59)
eGFR - low estimate: 90 mL/min (ref >59)

## 2020-10-24 LAB — FERRITIN: Ferritin: 173 ug/L — ABNORMAL HIGH (ref 12–160)

## 2020-10-24 LAB — COMPLETE BLOOD COUNT WITH DIFF
Abs Basophils: 0.02 10*9/L (ref 0.00–0.10)
Abs Eosinophils: 0.14 10*9/L (ref 0.00–0.40)
Abs Imm Granulocytes: 0.02 10*9/L (ref <0.10)
Abs Lymphocytes: 2.92 10*9/L (ref 1.00–3.40)
Abs Monocytes: 0.52 10*9/L (ref 0.20–0.80)
Abs Neutrophils: 4.72 10*9/L (ref 1.80–6.80)
Hematocrit: 37.7 % (ref 36.0–46.0)
Hemoglobin: 12.2 g/dL (ref 12.0–15.5)
MCH: 30.1 pg (ref 26.0–34.0)
MCHC: 32.4 g/dL (ref 31.0–36.0)
MCV: 93 fL (ref 80–100)
Platelet Count: 281 10*9/L (ref 140–450)
RBC Count: 4.05 10*12/L (ref 4.00–5.20)
WBC Count: 8.3 10*9/L (ref 3.4–10.0)

## 2020-10-24 LAB — VITAMIN D, 25-HYDROXY: Vitamin D, 25-Hydroxy: 24 ng/mL (ref 20–50)

## 2020-10-24 LAB — CHOLESTEROL, LDL (INCL. TOT. A
Chol HDL Ratio: 4.4 (ref <6.0)
Cholesterol, HDL: 43 mg/dL (ref >39)
Cholesterol, Total: 190 mg/dL (ref <200)
LDL Cholesterol: 128 mg/dL (ref <130)
Non HDL Cholesterol: 147 mg/dL (ref <160)
Triglycerides, serum: 94 mg/dL (ref <200)

## 2020-10-24 LAB — IRON, SERUM: Iron, serum: 92 ug/dL (ref 39–179)

## 2020-10-24 LAB — HEMOGLOBIN A1C: Hemoglobin A1c: 5 PERCENT (ref 4.3–5.6)

## 2020-10-24 MED ORDER — DICLOFENAC 1 % TOPICAL GEL
1 | Freq: Four times a day (QID) | TOPICAL | 0 refills | Status: AC
Start: 2020-10-24 — End: 2021-06-17
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
## 2020-10-24 NOTE — Patient Instructions (Addendum)
Dear Kayla Frey,     It was good to see you in clinic today. This document is your After Visit Summary, which includes what we discussed in clinic today, what the next steps are, and a list of your medications.     Things you should do after you leave clinic today:    For your knee pain:    - We referred to you PT    WE LOOK FORWARD TO SEEING YOU FOR PHYSICAL THERAPY. FOR ASSISTANCE WITH SCHEDULING, PLEASE CONTACT PHYSICAL THERAPY BY PHONE AT [phone], BY EMAIL AT [email or web address], OR PLACE A SCHEDULING REQUEST THROUGH MYCHART.    - Diclofenac up to 4 times daily as needed for pain    - Tylenol as needed and ice packs    - Try these exercises (below) at home    For the hair loss    - We will follow up the labs and let you know if you have any nutritional deficiencies    Knee: Exercises  Introduction  Here are some examples of exercises for you to try. The exercises may be suggested for a condition or for rehabilitation. Start each exercise slowly. Ease off the exercises if you start to have pain.  You will be told when to start these exercises and which ones will work best for you.  How to do the exercises  Quad sets    1. Sit with your leg straight and supported on the floor or a firm bed. (If you feel discomfort in the front or back of your knee, place a small towel roll under your knee.)  2. Tighten the muscles on top of your thigh by pressing the back of your knee flat down to the floor. (If you feel discomfort under your kneecap, place a small towel roll under your knee.)  3. Hold for about 6 seconds, then rest for up to 10 seconds.  4. Do 8 to 12 repetitions several times a day.  Straight-leg raises to the front    1. Lie on your back with your good knee bent so that your foot rests flat on the floor. Your injured leg should be straight. Make sure that your low back has a normal curve. You should be able to slip your flat hand in between the floor and the small of your back, with your palm touching the floor  and your back touching the back of your hand.  2. Tighten the thigh muscles in the injured leg by pressing the back of your knee flat down to the floor. Hold your knee straight.  3. Keeping the thigh muscles tight, lift your injured leg up so that your heel is about 12 inches off the floor. Hold for about 6 seconds and then lower slowly.  4. Do 8 to 12 repetitions, 3 times a day.  Straight-leg raises to the outside    1. Lie on your side, with your injured leg on top.  2. Tighten the front thigh muscles of your injured leg to keep your knee straight.  3. Keep your hip and your leg straight in line with the rest of your body, and keep your knee pointing forward. Do not drop your hip back.  4. Lift your injured leg straight up toward the ceiling, about 12 inches off the floor. Hold for about 6 seconds, then slowly lower your leg.  5. Do 8 to 12 repetitions.  Straight-leg raises to the back    1. Lie on your stomach, and lift your  leg straight up behind you (toward the ceiling).  2. Lift your toes about 6 inches off the floor, hold for about 6 seconds, then lower slowly.  3. Do 8 to 12 repetitions.  Straight-leg raises to the inside    1. Lie on the side of your body with the injured leg.  2. You can either prop your other (good) leg up on a chair, or you can bend your good knee and put that foot in front of your injured knee. Do not drop your hip back.  3. Tighten the muscles on the front of your thigh to straighten your injured knee.  4. Keep your kneecap pointing forward, and lift your whole leg up toward the ceiling about 6 inches. Hold for about 6 seconds, then lower slowly.  5. Do 8 to 12 repetitions.  Heel dig bridging    1. Lie on your back with both knees bent and your ankles bent so that only your heels are digging into the floor. Your knees should be bent about 90 degrees.  2. Then push your heels into the floor, squeeze your buttocks, and lift your hips off the floor until your shoulders, hips, and knees  are all in a straight line.  3. Hold for about 6 seconds as you continue to breathe normally, and then slowly lower your hips back down to the floor and rest for up to 10 seconds.  4. Do 8 to 12 repetitions.  Hamstring curls    1. Lie on your stomach with your knees straight. If your kneecap is uncomfortable, roll up a washcloth and put it under your leg just above your kneecap.  2. Lift the foot of your injured leg by bending the knee so that you bring the foot up toward your buttock. If this motion hurts, try it without bending your knee quite as far. This may help you avoid any painful motion.  3. Slowly lower your leg back to the floor.  4. Do 8 to 12 repetitions.  5. With permission from your doctor or physical therapist, you may also want to add a cuff weight to your ankle (not more than 5 pounds). With weight, you do not have to lift your leg more than 12 inches to get a hamstring workout.  Shallow standing knee bends    Do this exercise only if you have very little pain; if you have no clicking, locking, or giving way if you have an injured knee; and if it does not hurt while you are doing 8 to 12 repetitions.  1. Stand with your hands lightly resting on a counter or chair in front of you. Put your feet shoulder-width apart.  2. Slowly bend your knees so that you squat down like you are going to sit in a chair. Make sure your knees do not go in front of your toes.  3. Lower yourself about 6 inches. Your heels should remain on the floor at all times.  4. Rise slowly to a standing position.  Heel raises    1. Stand with your feet a few inches apart, with your hands lightly resting on a counter or chair in front of you.  2. Slowly raise your heels off the floor while keeping your knees straight.  3. Hold for about 6 seconds, then slowly lower your heels to the floor.  4. Do 8 to 12 repetitions several times during the day.  Follow-up care is a key part of your treatment and safety. Be  sure to make and go to all  appointments, and call your doctor if you are having problems. It's also a good idea to know your test results and keep a list of the medicines you take.  Where can you learn more?  Go to [employer]  Enter [phone] in the search box to learn more about "Knee: Exercises."  Current as of: July 1, 2021Content Version: 13.1   2006-2021 [employer].   Care instructions adapted under license by your healthcare professional. If you have questions about a medical condition or this instruction, always ask your healthcare professional. [employer] disclaims any warranty or liability for your use of this information.                        It was a pleasure seeing you today. Please don't hesitate to reach out if you have any additional questions or concerns.    Best,    Park Liter, MD

## 2020-10-25 LAB — VITAMIN B12: Vitamin B12: 653 ng/L (ref 301–816)

## 2020-10-25 LAB — FOLATE, SERUM: Folate, serum: 13.4 ng/mL (ref >4)

## 2020-10-25 LAB — PARATHYROID HORMONE, INTACT: PTH: 56 ng/L (ref 18–90)

## 2020-10-26 LAB — ZINC, SERUM/PLASMA: Zinc, plasma: 62 ug/dL (ref 55–150)

## 2020-10-27 LAB — COPPER, SERUM/PLASMA: Copper, serum/plasma: 130 ug/dL (ref 70–175)

## 2020-11-15 NOTE — Progress Notes (Signed)
Chart reviewed for vaccination records and upcoming appointments. No additional nursing action needed at this time.     Rhonda Linan, RN  [facility]

## 2020-12-04 MED ORDER — ECONAZOLE 1 % TOPICAL CREAM
1 % | TOPICAL | 1 refills | Status: DC
Start: 2020-12-04 — End: 2022-02-26

## 2020-12-17 ENCOUNTER — Ambulatory Visit
Payer: MEDICAID | Attending: Physician Assistant | Primary: Student in an Organized Health Care Education/Training Program

## 2020-12-17 DIAGNOSIS — Z9884 Bariatric surgery status: Secondary | ICD-10-CM

## 2020-12-17 DIAGNOSIS — Z6841 Body Mass Index (BMI) 40.0 and over, adult: Secondary | ICD-10-CM

## 2020-12-17 NOTE — Progress Notes (Signed)
I performed this consultation using real-time Telehealth tools, including a live video connection between my location and the patient's location. Prior to initiating the consultation, I obtained informed verbal consent to perform this consultation using Telehealth tools and answered all the questions about the Telehealth interaction.    HPI  I saw Kayla Frey today in the [facility]. This is her 6 month follow up.  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    She is enjoying a wide range of food types without significant nausea, vomiting, or dysphagia.  She has had no clinical symptoms of intestinal obstruction or marginal ulceration.  The patient has not had any readmissions, reoperations, or interventions related to their bariatric surgery since their last office visit.  She is taking a daily bariatric multivitamin:  Yes.  She is taking a daily calcium + vitamin D supplement:  Yes.    Having left knee pain.  Wearing a brace.  She stopped the omeprazole 2 weeks ago.   Eating yogurt, cottage cheese, egg wraps, broccoli, watermelon, premiere protein shakes.  Has stayed away from bread, rice and pasta.  Still not smoking.       Menstrual cycles irregular prior to surgery, tried an oral birth control before surgery but made her bleed more often.  Hx of ovarian cancer in grandmother, mother had cysts.  Has not tried a mirena IUD in the past.      Weight Loss and Diet  Today's (!) 163.3 kg (360 lb), giving a calculated body-mass index of Body mass index is 56.38 kg/m. her preoperative weight was 439 pounds. Her goal weight is 220 pounds.     Her weight history is as follows:   Wt Readings from Last 10 Encounters:   12/17/20 (!) 163.3 kg (360 lb)   10/24/20 (!) 172.8 kg (380 lb 14.4 oz)   09/17/20 (!) 176.4 kg (389 lb)   06/13/20 (!) 189.6 kg (418 lb)   05/31/20 (!) 199.5 kg (439 lb 13.1 oz)   05/24/20 (!) 198.5 kg (437 lb 9.6 oz)   02/08/20  (!) 193.2 kg (426 lb)   01/17/20 (!) 186.4 kg (411 lb)   01/16/20 (!) 186.4 kg (411 lb)   12/20/19 (!) 181.4 kg (400 lb)     Obesity-related Co-morbidity Resolution  Before bariatric surgery, her obesity-related co-morbidities were:  Diabetes - Never Diagnosed  Hypertension - Never Diagnosed  Hyperlipidemia - Never Diagnosed  Obstructive Sleep Apnea - Never Diagnosed   OSA on CPAP -- No  GERD - Never Diagnosed    Nutritional and Metabolic Laboratory Assessment  I reviewed the patient's nutritional and metabolic labs today:  Vitamin D 19->24, remaining labs wnl     Medications and Vitamins  I reviewed and changed her medications and vitamins supplements today as needed, which are:  Current Medications       Dosage    biotin 10,000 mcg TABRAPID Take 1 tablet by mouth    clotrimazole (LOTRIMIN) 1 % cream Apply topically Twice a day Use as instructed    diclofenac (VOLTAREN) 1 % gel Apply topically 4 (four) times daily    econazole nitrate 1 % cream APPLY TOPICALLY TO THE AFFECTED AREA ON ABDOMEN TWICE DAILY FOR 7 TO 14 DAYS    EPINEPHrine (EPIPEN) 0.3 mg/0.3 mL injection Inject 0.3 mL (0.3 mg total) into the muscle once as needed for Anaphylaxis for up to 1 dose Use as instructed    escitalopram  oxalate (LEXAPRO) 20 mg tablet Take 20 mg by mouth every morning .crush tablet before taking for 3 months.      multivitamin-min-iron-FA-vit K (PROCARE BARIATRIC) 45 mg iron- 800 mcg-120 mcg capsule Take 1 capsule by mouth nightly at bedtime    nystatin (MYCOSTATIN) powder Apply topically 3 (three) times daily        Physical Exam   Vitals: Ht 170.2 cm (5\' 7" )   Wt (!) 163.3 kg (360 lb)   BMI 56.38 kg/m   Constitutional: oriented to person, place, and time.  Skin: well-healed port site incisions without infection, hernia, or seroma.    Assessment and Plan  This is a 35 y.o. female doing well after bariatric surgery.  I would like to see the patient again in 6 months.  I have given the patient a list of laboratory work to be  done beforehand which can be completed at 33.  I will keep you apprised of her progress.    We discussed the importance of birth control in the first 12-18 months after bariatric surgery to reduce the chance of an unplanned pregnancy.  She could be a great candidate for a mirena IUD.  She will discuss further with Dr. ONEOK.      Goals after bariatric surgery:  - Patients should strive for a healthy BMI with as best resolution / prevention of obesity related comorbid diseases as possible.  ?  Daily Water Requirements:  - Daily water goals for the standard patient including 64 oz minimum water intake (1-2 Liters) some medical conditions may require careful balancing of water intake to avoid health complications, please refer to your specialists as needed to adjust water intake adequately to maintain hydration but minimize risk of exacerbation of chronic disease.  ?  Daily Multi Vitamins:  - It is very important to take a daily high potency multi vitamin for life. The [facility] team recommends the Bariatric Pro Care Multi Vitamin with 45mg  of iron (chewable or capsule) once daily (capsules can be swallowed whole).  - This is available only online without need of a prescription at [email or web address]  ?  Daily Calcium Supplementation:  - You should take 1200-1500 mg daily of calcium citrate for life. It is best to take your calcium supplementation in small doses throughout the day for best absorption. There are many forms available including: tablet, chewable, soft chew, gummies etc. It is recommended that patients find a calcium citrate brand that they enjoy, that is calorie free or very low calorie. These are available at any pharmacy or online without the need of a prescription. We recommend trying the Celebrate brand Calcium citrate soft chews available at celebratevitamins.com  ?  Daily Protein Requirements:  - Goal should be protein intake between 60-80 grams per day. Some options to help you reach  your daily goals including protein shakes or powders, lean meats ( , fish, chicken), tofu, etc. In addition you will need green vegetable intake to maintain adequate nutrition and you should avoid sugars, carbohydrates, starches, pasta, rice, bread, and potatoes.  ?  Calorie Goals - Weight Loss Phase:  - Daily Calorie Goal of 500-700 total per day, focusing on lean proteins and leafy green vegetables during the weight loss phase or until you have reached your desired goal weight.  ?  Calorie Goals - Weight Maintenance Phase:  - Daily Calorie Goal of around 1000 per day (+/- 100-200 calories) once you have achieved your weight loss goal. You should continue to count  and log calories for life even when at goal weight to maintain desired weight. You may need to adjust your calories up or down to maintain their weight at goal over time.  ?  Medications:  - Please check with your PCP or specialists for medication adjustments and refills as needed after bariatric surgery. The only medications that you should NOT take for life after bariatric surgery are the Non Steroidal Anti Inflammatory Drugs (NSAIDs). These include Asprin (unless prescribed by a cardiologist), Advil, Aleve, Naproxen, Motrin, and Ibuprofen, etc.  ?  Smoking cigarettes should never occur after bariatric surgery. Minimal alcohol intake is acceptable 3 months after surgery, but any alcohol (wine or beer) intake should be limited to 2oz. / 8 hours, as the effect of alcohol will be more pronounced.   ?  We recommend that you continue to carefully count and restrict calories according to the postoperative bariatric surgery protocol and your nutritionist-specific recommendations. Caloric intake should be recorded separately either online or in a bariatric surgery journal for daily review. Daily calorie counting is imperative for successful and long-term weight loss, and can employ the following tips:  - Identify a set time and place to count calories -  make this process a daily habit.   - Using calorie counting aids (web-based: www.fitday.com) can help.   - Use a personal food space at home to help concentrate on food types that others in the household may not be eating.  - Advance meal planning using a daily / weekly spreadsheet.   - Time your meals to less than 10 minutes per meal (eg. use an egg timer if necessary)     Also, attendance at a support group is highly encouraged. The [facility] meets the 3rd Wednesday of every month at 5pm - the sessions are currently being held virtually due to COVID-19.  Please join our email list to receive the Zoom information each month. Patients should feel free to join this group any time and bring friends or family members. To add an email to the support group mailing list, send request to: [email or web address].     I spent a total of 41 minutes on this patient's care on the day of their visit excluding time spent related to any billed procedures. This time includes time spent with the patient as well as time spent documenting in the medical record, reviewing patient's records and tests, obtaining history, placing orders, communicating with other healthcare professionals, counseling the patient, family, or caregiver, and/or care coordination for the diagnoses above.    This is an independent service.  The available consultant for this service is Georgia Duff, MD.       Dixon Boos, PA-C  [facility]: Bariatrics   [phone]

## 2020-12-17 NOTE — Telephone Encounter (Signed)
Pt requesting letter for home help for ongoing knee pain. Asked for clarification of sxs, if pt would like re-eval for knee problem, encouraged to make PT appt (previously referred) and info regarding what types of activities are limited and what things she would like help with and for how long.

## 2021-01-18 MED ORDER — NYSTOP 100,000 UNIT/GRAM TOPICAL POWDER
100000 | TOPICAL | 5 refills | 22.50000 days | Status: DC
Start: 2021-01-18 — End: 2021-06-10

## 2021-04-24 NOTE — Telephone Encounter (Signed)
PT referral ordered in March expired in May. Routing to [facility] to replace referral.

## 2021-05-21 ENCOUNTER — Encounter
Admit: 2021-05-21 | Payer: MEDICAID | Attending: Rehabilitative and Restorative Service Providers" | Primary: Student in an Organized Health Care Education/Training Program

## 2021-05-21 DIAGNOSIS — M25562 Pain in left knee: Secondary | ICD-10-CM

## 2021-05-21 NOTE — Progress Notes (Signed)
This encounter was created in error - please disregard.

## 2021-05-21 NOTE — Progress Notes (Signed)
Pt did not dial in for today's PT initial evaluation via telehealth.

## 2021-06-10 ENCOUNTER — Ambulatory Visit
Discharge: 2021-06-19 | Payer: MEDICAID | Attending: Physician Assistant | Primary: Student in an Organized Health Care Education/Training Program

## 2021-06-10 DIAGNOSIS — Z9884 Bariatric surgery status: Secondary | ICD-10-CM

## 2021-06-10 DIAGNOSIS — L304 Erythema intertrigo: Secondary | ICD-10-CM

## 2021-06-10 MED ORDER — NYSTATIN 100,000 UNIT/GRAM TOPICAL POWDER
100000 | Freq: Two times a day (BID) | TOPICAL | 5 refills | 30.00000 days | Status: AC
Start: 2021-06-10 — End: 2021-09-10

## 2021-06-10 MED ORDER — KETOCONAZOLE 2 % TOPICAL CREAM
2 | Freq: Every day | TOPICAL | 5 refills | 30.00000 days | Status: AC
Start: 2021-06-10 — End: 2022-02-07

## 2021-06-10 NOTE — Progress Notes (Signed)
I performed this consultation using real-time Telehealth tools, including a live video connection between my location and the patient's location. Prior to initiating the consultation, I obtained informed verbal consent to perform this consultation using Telehealth tools and answered all the questions about the Telehealth interaction.    HPI  I saw Kayla Frey today in the [facility]. This is her 12 month follow up.  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    She is enjoying a wide range of food types without significant nausea, vomiting, or dysphagia.  She has had no clinical symptoms of intestinal obstruction or marginal ulceration.  The patient has not had any readmissions, reoperations, or interventions related to their bariatric surgery since their last office visit.  She is taking a daily bariatric multivitamin:  Yes.  She is taking a daily calcium + vitamin D supplement:  Yes.    Having trouble with her relationship with food.  Eating a lot of chicken.  Going to the gym in her apartment building 3 times a week for 35 mins.      Antifungal cream and powder has been helping to keep intertrigo symptoms well controlled.      Weight Loss and Diet  Today's (!) 140.2 kg (309 lb), giving a calculated body-mass index of Body mass index is 48.4 kg/m. her preoperative weight was 439 pounds. Her goal weight is 220 pounds.     Her weight history is as follows:   Wt Readings from Last 10 Encounters:   06/10/21 (!) 140.2 kg (309 lb)   12/17/20 (!) 163.3 kg (360 lb)   10/24/20 (!) 172.8 kg (380 lb 14.4 oz)   09/17/20 (!) 176.4 kg (389 lb)   06/13/20 (!) 189.6 kg (418 lb)   05/31/20 (!) 199.5 kg (439 lb 13.1 oz)   05/24/20 (!) 198.5 kg (437 lb 9.6 oz)   02/08/20 (!) 193.2 kg (426 lb)   01/17/20 (!) 186.4 kg (411 lb)   01/16/20 (!) 186.4 kg (411 lb)     Obesity-related Co-morbidity Resolution  Before bariatric surgery, her obesity-related  co-morbidities were:  Diabetes - Never Diagnosed  Hypertension - Never Diagnosed  Hyperlipidemia - Never Diagnosed  Obstructive Sleep Apnea - Never Diagnosed   OSA on CPAP -- No  GERD - Never Diagnosed    Nutritional and Metabolic Laboratory Assessment  I reviewed the patient's nutritional and metabolic labs today:  Need to be completed     Medications and Vitamins  I reviewed and changed her medications and vitamins supplements today as needed, which are:  Current Medications       Dosage    biotin 10,000 mcg TABRAPID Take 1 tablet by mouth    clotrimazole (LOTRIMIN) 1 % cream Apply topically Twice a day Use as instructed    diclofenac (VOLTAREN) 1 % gel Apply topically 4 (four) times daily    econazole nitrate 1 % cream APPLY TOPICALLY TO THE AFFECTED AREA ON ABDOMEN TWICE DAILY FOR 7 TO 14 DAYS    EPINEPHrine (EPIPEN) 0.3 mg/0.3 mL injection Inject 0.3 mL (0.3 mg total) into the muscle once as needed for Anaphylaxis for up to 1 dose Use as instructed    escitalopram oxalate (LEXAPRO) 20 mg tablet Take 20 mg by mouth every morning .crush tablet before taking for 3 months.      multivitamin-min-iron-FA-vit K (PROCARE BARIATRIC) 45 mg iron- 800 mcg-120 mcg capsule Take 1 capsule by mouth nightly at  bedtime    NYSTOP powder APPLY TOPICALLY THREE TIMES DAILY        Physical Exam   Vitals: Ht 170.2 cm (5\' 7" )   Wt (!) 140.2 kg (309 lb)   BMI 48.40 kg/m   Constitutional: oriented to person, place, and time.  Skin: well-healed port site incisions without infection, hernia, or seroma.    Assessment and Plan  This is a 35 y.o. female doing well after bariatric surgery.  She will go do labs in the next 1-2 weeks.  I will keep her posted via mychart about the results. I would like to see the patient again in 1 year; self-scheduling ticket assigned.  I have given the patient a list of laboratory work to be done beforehand which can be completed at 20.  I will keep you apprised of her progress.    Encouraged her to eat  smaller portions, but then eat more frequently through the day so she is reaching her protein goals.      She will continue with nystatin and ketoconazole for treatment of intertrigo.  Refills sent to her local pharmacy.     Will plan to refer to plastic surgery for excess skin removal once she is closer to 250lbs or less.      Discussed ok to switch to bariatric choice multivitamin on amaz[employer].      Goals after bariatric surgery:  - Patients should strive for a healthy BMI with as best resolution / prevention of obesity related comorbid diseases as possible.  ?  Daily Water Requirements:  - Daily water goals for the standard patient including 64 oz minimum water intake (1-2 Liters) some medical conditions may require careful balancing of water intake to avoid health complications, please refer to your specialists as needed to adjust water intake adequately to maintain hydration but minimize risk of exacerbation of chronic disease.  ?  Daily Multi Vitamins:  - It is very important to take a daily high potency multi vitamin for life. The [facility] team recommends the Bariatric Pro Care Multi Vitamin with 45mg  of iron (chewable or capsule) once daily (capsules can be swallowed whole).  - This is available only online without need of a prescription at [email or web address]  ?  Daily Calcium Supplementation:  - You should take 1200-1500 mg daily of calcium citrate for life. It is best to take your calcium supplementation in small doses throughout the day for best absorption. There are many forms available including: tablet, chewable, soft chew, gummies etc. It is recommended that patients find a calcium citrate brand that they enjoy, that is calorie free or very low calorie. These are available at any pharmacy or online without the need of a prescription. We recommend trying the Celebrate brand Calcium citrate soft chews available at celebratevitamins.com  ?  Daily Protein Requirements:  - Goal should be protein  intake between 60-80 grams per day. Some options to help you reach your daily goals including protein shakes or powders, lean meats ( , fish, chicken), tofu, etc. In addition you will need green vegetable intake to maintain adequate nutrition and you should avoid sugars, carbohydrates, starches, pasta, rice, bread, and potatoes.  ?  Calorie Goals - Weight Loss Phase:  - Daily Calorie Goal of 500-700 total per day, focusing on lean proteins and leafy green vegetables during the weight loss phase or until you have reached your desired goal weight.  ?  Calorie Goals - Weight Maintenance Phase:  - Daily Calorie Goal of  around 1000 per day (+/- 100-200 calories) once you have achieved your weight loss goal. You should continue to count and log calories for life even when at goal weight to maintain desired weight. You may need to adjust your calories up or down to maintain their weight at goal over time.  ?  Medications:  - Please check with your PCP or specialists for medication adjustments and refills as needed after bariatric surgery. The only medications that you should NOT take for life after bariatric surgery are the Non Steroidal Anti Inflammatory Drugs (NSAIDs). These include Asprin (unless prescribed by a cardiologist), Advil, Aleve, Naproxen, Motrin, and Ibuprofen, etc.  ?  Smoking cigarettes should never occur after bariatric surgery. Minimal alcohol intake is acceptable 3 months after surgery, but any alcohol (wine or beer) intake should be limited to 2oz. / 8 hours, as the effect of alcohol will be more pronounced.   ?  We recommend that you continue to carefully count and restrict calories according to the postoperative bariatric surgery protocol and your nutritionist-specific recommendations. Caloric intake should be recorded separately either online or in a bariatric surgery journal for daily review. Daily calorie counting is imperative for successful and long-term weight loss, and can employ the  following tips:  - Identify a set time and place to count calories - make this process a daily habit.   - Using calorie counting aids (web-based: www.fitday.com) can help.   - Use a personal food space at home to help concentrate on food types that others in the household may not be eating.  - Advance meal planning using a daily / weekly spreadsheet.   - Time your meals to less than 10 minutes per meal (eg. use an egg timer if necessary)     Also, attendance at a support group is highly encouraged. The [facility] Group meets the 3rd Wednesday of every month at 5pm - the sessions are currently being held virtually due to COVID-19.  Please join our email list to receive the Zoom information each month. Patients should feel free to join this group any time and bring friends or family members. To add an email to the support group mailing list, send request to: [email or web address].     I spent a total of 30 minutes on this patient's care on the day of their visit excluding time spent related to any billed procedures. This time includes time spent with the patient as well as time spent documenting in the medical record, reviewing patient's records and tests, obtaining history, placing orders, communicating with other healthcare professionals, counseling the patient, family, or caregiver, and/or care coordination for the diagnoses above.    This is an independent service.  The available consultant for this service is Georgia Duff, MD.       Dixon Boos, PA-C  [facility]: Bariatrics   [phone]

## 2021-06-18 MED ORDER — DICLOFENAC 1 % TOPICAL GEL
1 | Freq: Four times a day (QID) | TOPICAL | 0 refills | 30.00000 days | Status: DC
Start: 2021-06-18 — End: 2022-08-08

## 2021-06-18 MED ORDER — EPINEPHRINE 0.3 MG/0.3 ML INJECTION, AUTO-INJECTOR
0.3 | Freq: Once | INTRAMUSCULAR | 6 refills | 29.00000 days | Status: DC | PRN
Start: 2021-06-18 — End: 2023-02-02

## 2021-06-18 NOTE — Telephone Encounter (Signed)
Current pended order:  EPINEPHrine (EPIPEN) 0.3 mg/0.3 mL injection  Inject 0.3 mL (0.3 mg total) into the muscle once as needed for Anaphylaxis for up to 1 dose Use as instructed    Last time approved:  EPINEPHrine (EPIPEN) 0.3 mg/0.3 mL injection  Inject 0.3 mL (0.3 mg total) into the muscle once as needed for Anaphylaxis for up to 1 dose Use as instructed  Dispense: 1 each Refill: 6  GEN [facility] MZ 1545 2 by Louanne Skye, JESSICA ALCALAY on 05/14/20    Current pended order:  diclofenac (VOLTAREN) 1 % gel  Apply topically 4 (four) times daily    Last time approved:  diclofenac (VOLTAREN) 1 % gel  Apply topically 4 (four) times daily  Dispense: 50 g Refill: 0  [facility] 1545 2 by Louanne Skye, JESSICA ALCALAY on 10/24/20        Last office visit:    10/24/2020   Last video visit:     05/23/2020   Last scheduled telephone encounter:  Visit date not found    Next appointment:  Visit date not found    Last  BP:  BP Readings from Last 1 Encounters:   10/24/20 118/63       Last Labs:    Lab Results   Component Value Date    Creatinine 0.85 10/24/2020    Sodium, Serum / Plasma 140 10/24/2020    Potassium, Serum / Plasma 4.0 10/24/2020    Hemoglobin A1c 5.0 10/24/2020    Cholesterol, Total 190 10/24/2020    LDL Cholesterol 128 10/24/2020    Cholesterol, HDL 43 10/24/2020    Thyroid Stimulating Hormone 1.15 01/02/2020    Hemoglobin 12.2 10/24/2020    eGFR - low estimate 90 10/24/2020    eGFR - high estimate 104 10/24/2020       If patient has not been seen in clinic for 12 months or more I have routed encounter to the administrative team to book a follow-up appointment.

## 2021-06-19 MED ORDER — ESCITALOPRAM 20 MG TABLET
20 | ORAL_TABLET | Freq: Every morning | ORAL | 0 refills | Status: DC
Start: 2021-06-19 — End: 2021-09-10

## 2021-06-19 NOTE — Telephone Encounter (Signed)
Received [facility] ([address]) rx request re: escitalopram 53m tabs.      Current pended order:  escitalopram oxalate (LEXAPRO) 20 mg tablet  Take 1 tablet (20 mg total) by mouth every morning .crush tablet before taking for 3 months.    Last time approved:  escitalopram oxalate (LEXAPRO) 20 mg tablet  Take 20 mg by mouth every morning .crush tablet before taking for 3 months.    Dispense:  Refill:   13L GEN SURG by SLeanora Coveron 06/04/20      Last office visit:    10/24/2020   Last video visit:     05/23/2020   Last scheduled telephone encounter:  Visit date not found  Last telephone visit:     Visit date not found    Next appointment:  Visit date not found    Last  BP:  BP Readings from Last 3 Encounters:   10/24/20 118/63   06/04/20 135/72       Last Labs:    Lab Results   Component Value Date    Creatinine 0.85 10/24/2020    Sodium, Serum / Plasma 140 10/24/2020    Potassium, Serum / Plasma 4.0 10/24/2020    Hemoglobin A1c 5.0 10/24/2020    Cholesterol, Total 190 10/24/2020    LDL Cholesterol 128 10/24/2020    Cholesterol, HDL 43 10/24/2020    Thyroid Stimulating Hormone 1.15 01/02/2020    Hemoglobin 12.2 10/24/2020    eGFR - low estimate 90 10/24/2020    eGFR - high estimate 104 10/24/2020

## 2021-06-19 NOTE — Telephone Encounter (Signed)
Dear Kayla Frey-    pls schedule extended in-person appt w me any time in next 2-4 mo for follow up on mood sxs, PAP and HCM    Thank you!

## 2021-06-20 NOTE — Telephone Encounter (Signed)
Left a message to have pt call back or using mychart to schedule fup appt w/PCP in next 2-4 mo.

## 2021-07-23 NOTE — Telephone Encounter (Signed)
PT ref still active. Sent pt scheduling instructions. Will provide vaccinations pt is due for at upcoming clinic visit.

## 2021-07-23 NOTE — Telephone Encounter (Signed)
PT referral pended in separate encounter. Pended Ophthalmology/Optometry referral. Please advise on chiropractic referral, this is typically external and based on insurance coverage.

## 2021-07-23 NOTE — Telephone Encounter (Signed)
Sent pt msg to clarify reason for requesting referral to chiropractor and eye doctor to understand current sxs. Will also discuss at upcoming appt

## 2021-07-24 NOTE — Telephone Encounter (Signed)
Placed referral for eye exam, new PT referral and referral to integrative med for spinal manipulation given pt request for chiropractor. Asked her to send me clinic name if she would like an external referral placed

## 2021-07-25 NOTE — Telephone Encounter (Signed)
Notified pt that [facility] provider no longer accepting new patients w instructions to let me know where she would like an external referral placed

## 2021-07-26 ENCOUNTER — Ambulatory Visit
Admit: 2021-07-26 | Payer: MEDICAID | Attending: Student in an Organized Health Care Education/Training Program | Primary: Student in an Organized Health Care Education/Training Program

## 2021-07-26 ENCOUNTER — Ambulatory Visit: Admit: 2021-07-26 | Payer: MEDICAID | Primary: Student in an Organized Health Care Education/Training Program

## 2021-07-26 DIAGNOSIS — Z9884 Bariatric surgery status: Secondary | ICD-10-CM

## 2021-07-26 DIAGNOSIS — Z23 Encounter for immunization: Secondary | ICD-10-CM

## 2021-07-26 LAB — COMPLETE BLOOD COUNT
Hematocrit: 32.8 % — ABNORMAL LOW (ref 36.0–46.0)
Hemoglobin: 10.8 g/dL — ABNORMAL LOW (ref 12.0–15.5)
MCH: 31.3 pg (ref 26.0–34.0)
MCHC: 32.9 g/dL (ref 31.0–36.0)
MCV: 95 fL (ref 80–100)
MPV: 10.8 fL (ref 9.1–12.6)
Platelet Count: 320 10*9/L (ref 140–450)
RBC Count: 3.45 10*12/L — ABNORMAL LOW (ref 4.00–5.20)
RDW-CV: 12.5 % (ref 11.7–14.4)
WBC Count: 7.3 10*9/L (ref 3.4–10.0)

## 2021-07-26 MED ORDER — CALCIUM 600 ORAL
ORAL | 0.00 refills | Status: DC
Start: 2021-07-26 — End: 2023-05-15

## 2021-07-26 MED ORDER — LORAZEPAM 1 MG TABLET
1 | ORAL_TABLET | Freq: Once | ORAL | 0 refills | 10.00000 days | Status: AC | PRN
Start: 2021-07-26 — End: 2021-09-10

## 2021-07-26 MED ORDER — FLU VACCINE QS 2022-23(6MOS UP)(PF) 60 MCG(15 MCGX4)/0.5 ML IM SYRINGE
60 | Freq: Once | INTRAMUSCULAR | Status: AC
Start: 2021-07-26 — End: 2021-07-26
  Administered 2021-07-26: 22:00:00 via INTRAMUSCULAR

## 2021-07-26 NOTE — Result Quicknote (Signed)
I reviewed the recent tests. There is nothing concerning about the results.

## 2021-07-26 NOTE — Patient Instructions (Addendum)
Dear Kayla Frey,     It was good to see you in clinic today. This document is your After Visit Summary, which includes what we discussed in clinic today, what the next steps are, and a list of your medications.     Things you should do after you leave clinic today:    - For your fertility issues I have referred you to gynecology.    - For the ADHD evaluation I referred to our mental health program.     - Please get your blood tests completed today    - We will schedule you for a PAP soon. Please take 0.5- 1mg  of the ativan 20-30 minutes the exam       Please don't hesitate to reach out with any questions or concerns. MyChart is a good way to reach me or a colleague for non-urgent questions (usually a turn-around time of 3 days). For more urgent matters, it's best to call the clinic at [phone]. There is always a nurse on-call to triage questions during regular business hours and schedule same-day appointments, and there is a physician on call 24/7.      Please let me know if you have any questions or concerns going forward.     Best,    [phone], MD
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
## 2021-07-27 LAB — LIPID PANEL (INCL. LDL, HDL, TOTAL CHOL. AND TRIG.)
Chol HDL Ratio: 3.8 (ref <5.6)
Cholesterol, HDL: 53 mg/dL (ref >39)
Cholesterol, Total: 199 mg/dL (ref <200)
LDL Cholesterol: 133 mg/dL — ABNORMAL HIGH (ref <130)
Non HDL Cholesterol: 146 mg/dL (ref <160)
Triglycerides, serum: 63 mg/dL (ref <200)

## 2021-07-27 LAB — COMPREHENSIVE METABOLIC PANEL (BMP, AST, ALT, T.BILI, ALKP, TP ALB)
AST: 13 U/L (ref 5–44)
Alanine transaminase: 9 U/L — ABNORMAL LOW (ref 10–61)
Albumin, Serum / Plasma: 3.8 g/dL (ref 3.5–5.0)
Alkaline Phosphatase: 79 U/L (ref 38–108)
Anion Gap: 10 (ref 4–14)
Bilirubin, Total: 0.3 mg/dL (ref 0.2–1.2)
Calcium, total, Serum / Plasma: 9.1 mg/dL (ref 8.4–10.5)
Carbon Dioxide, Total: 25 mmol/L (ref 22–29)
Chloride, Serum / Plasma: 104 mmol/L (ref 101–110)
Creatinine: 0.87 mg/dL (ref 0.55–1.02)
Glucose, non-fasting: 85 mg/dL (ref 70–199)
Potassium, Serum / Plasma: 4.1 mmol/L (ref 3.5–5.0)
Protein, Total, Serum / Plasma: 7 g/dL (ref 6.3–8.6)
Sodium, Serum / Plasma: 139 mmol/L (ref 135–145)
Urea Nitrogen, Serum / Plasma: 16 mg/dL (ref 7–25)
eGFRcr: 90 mL/min/{1.73_m2} (ref >59)

## 2021-07-27 LAB — FERRITIN: Ferritin, Serum/Plasma: 137 ug/L (ref 12–160)

## 2021-07-27 LAB — VITAMIN D, 25-HYDROXY: Vitamin D, 25-Hydroxy: 21 ng/mL (ref 20–50)

## 2021-07-27 LAB — HEMOGLOBIN A1C: Hemoglobin A1c: 4.5 PERCENT (ref 4.3–5.6)

## 2021-07-27 LAB — IRON: Iron, serum: 19 ug/dL — ABNORMAL LOW (ref 39–179)

## 2021-07-28 LAB — VITAMIN B12: Vitamin B12: 532 ng/L (ref 301–816)

## 2021-07-28 LAB — PARATHYROID HORMONE, INTACT: PTH: 69 ng/L (ref 18–90)

## 2021-07-29 LAB — HEPATITIS B SURFACE ANTIGEN: Hep B surf Ag: NEGATIVE

## 2021-07-29 LAB — FOLATE: Folate, serum: 6.4 ng/mL (ref >4)

## 2021-07-29 LAB — HEPATITIS B CORE ANTIBODY, TOTAL: Hep B Core Ab Total: NEGATIVE

## 2021-07-29 LAB — VARICELLA ZOSTER VIRUS IGG, SERUM: Varicella-zoster Antibody, IgG: 721.1 Index

## 2021-07-29 LAB — MEASLES ANTIBODY, IGG, SERUM: Measles Antibody, IgG, serum: 178 AU/mL

## 2021-07-29 LAB — RUBELLA ANTIBODY IGG: Rubella Antibody: 2 Index

## 2021-07-29 LAB — MUMPS VIRUS ANTIBODY, IGG: Mumps virus Antibody, IgG: 143 AU/mL

## 2021-08-01 NOTE — Telephone Encounter (Signed)
Behavioral Health Navigation Update    A voicemail was left for this patient. Contact information was provided but the specific reason for the call was not disclosed in order to protect patient's confidentiality. Encouraged pt to check MyChart for information that was sent per PCP's request. Instructed pt to respond to MyChart or contact the BHN Team if they have any further questions.

## 2021-08-02 LAB — THIAMINE PYROPHOSPHATE: Thiamine Pyrophosphate: 94 nmol/L (ref 70–180)

## 2021-08-05 ENCOUNTER — Encounter
Admit: 2021-08-05 | Payer: MEDICAID | Attending: Rehabilitative and Restorative Service Providers" | Primary: Student in an Organized Health Care Education/Training Program

## 2021-08-05 DIAGNOSIS — M25562 Pain in left knee: Secondary | ICD-10-CM

## 2021-08-05 DIAGNOSIS — G8929 Other chronic pain: Secondary | ICD-10-CM

## 2021-08-05 LAB — COPPER, SERUM/PLASMA: Copper, serum/plasma: 128 ug/dL (ref 70–175)

## 2021-08-05 NOTE — Progress Notes (Signed)
I performed this consultation using real-time Telehealth tools, including a live video connection between my location and the patient's location. Prior to initiating the consultation, I obtained informed verbal consent to perform this consultation using Telehealth tools and answered all the questions about the Telehealth interaction.    INITIAL EVALUATION     Kayla Frey was seen in the Outpatient Physical Therapy Faculty Practice at [facility] for an Initial Evaluation on 08/05/2021 for Chronic pain of left knee.    The patient is a 35 y.o. female who presents to PT with s/sx consistent with L Knee pain (possible arthritis vs meniscal pathology vs PFPS, OBJ exam pending). The patient is primarily impaired by increased pain, decreased ROM, decreased strength, decreased muscle length and impaired balance all of which limit her ability to perform the following activities: walking, transitional movements, lifting, reaching and recreational activity.  The patient will benefit from skilled physical therapy in order to address the impairments listed above and reach the attached goals.  Personal and environmental factors include significant co-morbidities.    PLAN: We plan on following up with the patient 1x/wk for 12 Week(s).  Treatment Interventions will include: Patient/Caregiver Education, Therapeutic/Functional Activities, Self Care, Therapeutic Exercise, Physical Tests & Measures, Manual Therapy, Neuromuscular Re-education, Gait Training.    Focus of next therapy session: OBJ Exam    Flowsheet Row Most Recent Value   PT Short Term Goals    Goals Home Program, ROM, Strength, Function   Home Program    Patient/Caregiver will be independent with home program indep wit initial HEP including activity modifications LE stretching and strength   Complete Within (visits) 3 visits   ROM    Patient will increase ROM TBD pending OBJ exam   Strength    Patient will improve strength TBD pending OBJ exam   Function    Patient  demonstates functional ability Pt will  demonstrate good body mechanics with it to stand with 50% less max pain in knee with loading   Complete Within (visits) 5 visits        Flowsheet Row Most Recent Value   Long Term Goals    Add Goal HEP, ROM, Strength, Function   HEP    Patient/Caregiver independent with home program Progressive strength for LE and core, functional strength as well as swim/walk/bike program as tolerated   Complete Within (visits) 12 visits   ROM    Patient demonstrates range of motion TBD pending OBJ exam   Strength    Patient demonstrates strength TBD pending OBJ exam   Function    Patient demonstates functional ability Pt will be able to walk 3 city blocks on [place] un[place] surfaces with miminal knee pain   Complete Within (visits) 12 visits           The patients potential for achieving stated goals is: fair

## 2021-08-09 ENCOUNTER — Ambulatory Visit
Admit: 2021-08-09 | Discharge: 2021-08-28 | Payer: MEDICAID | Attending: Student in an Organized Health Care Education/Training Program | Primary: Student in an Organized Health Care Education/Training Program

## 2021-08-09 DIAGNOSIS — M25562 Pain in left knee: Secondary | ICD-10-CM

## 2021-08-09 DIAGNOSIS — G8929 Other chronic pain: Secondary | ICD-10-CM

## 2021-08-09 NOTE — Telephone Encounter (Signed)
Dear Madlyn Frankel- please send pt a copy of her completed advanced directive (should be scanned and uploaded soon) via mychart.    Thanks!

## 2021-08-09 NOTE — Progress Notes (Addendum)
Subjective    Kayla Frey is a 35 y.o. female who presents with the following:    Chief Complaint            Knee Problem PT via Zoom is difficult, would like to discuss possible imaging for L knee    Gynecologic Exam              History of Present Illness     #Cervical cancer screening  - has never had a PAP before, very anxious about them  - irregular menses due to PCOS  - not currently on any form on contraception  - SA with female partner, most recently last week  - Has never been pregnant  - not currently menstruating     #Blood pressure  BP Readings from Last 3 Encounters:   08/09/21 132/72   07/26/21 (!) 143/64   10/24/20 118/63       #L knee pain  - ongoing L knee pain  - had zoom PT session, wondering if any in-person opportunities  - interested in imaging            Objective      Vitals    Flowsheet Row Most Recent Value   BP 132/72   Heart Rate 63   SpO2 100 %   Weight 138.9 kg (306 lb 4.8 oz)   Height 172.7 cm (5\' 8" )   Pain Score 0   BMI (Calculated) 46.7            Physical Exam  Vitals reviewed.   Constitutional:       General: She is not in acute distress.     Appearance: She is well-developed.   HENT:      Head: Atraumatic.   Pulmonary:      Effort: Pulmonary effort is normal. No respiratory distress.   Genitourinary:     General: Normal vulva.      Labia:         Right: No tenderness or lesion.         Left: No lesion.       Urethra: No urethral pain, urethral swelling or urethral lesion.   Lymphadenopathy:      Lower Body: No right inguinal adenopathy.   Neurological:      Mental Status: She is alert and oriented to person, place, and time.           Review of Prior Testing       Assessment and Plan       Problem List Items Addressed This Visit     Elevated blood pressure reading    Overview     BP elevated to 143/64 at last appt (07/25/21). First recorded elevated BP. WNL today.  BP Readings from Last 3 Encounters:   08/09/21 132/72   07/26/21 (!) 143/64   10/24/20 118/63   - CTM                  Chronic pain of left knee - Primary    Overview     Worsening L pain X 7-8 mo iso increasing exercise post bariatric surgery, (walking ~1 hour per day and using elliptical or treadmill ~1 hour per day) w/o gait instability, recent trauma or weakness. 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 No e/o ligament laxity on exam. No recent trauma. No e/o meniscus injury on exam.   - established care w PT- will ask if possible to do therapy in-person  -  follow up L Knee XR  - cont home knee exercises  - diclofenac gel and ice prn  - tylenol prn (NSAID contraindicated given recent gastrectomy)  - compression knee brace when exercising         Relevant Orders    XR Knee Complete 4+ Views, Left        Cervical cancer screening    Overview     Has never had a PAP before due to anxiety about the procedure. Overdue. Irregular menses due to PCOS. SA with female partner, most recently last week. Does not use any form on contraception. Has never been pregnant  - follow up cytology and HPV  - follow up G/C         Relevant Orders    Gynecologic Cytology 30+        ACP (advance care planning)    Overview     Pt very proactive and filled out AD paperwork. Is FULL code. Her first decision maker is: Antonela Freiman: [phone]. Her second decision maker is Manjot Beumer [phone]  - Will make copy and scan into patient chart            Other Visit Diagnoses     Routine screening for STI (sexually transmitted infection)        Relevant Orders    Chlamydia Trachomatis/Neisseria Gonorrhoeae RNA                           I spent a total of 30 minutes on this patient's care on the day of their visit excluding time spent related to any billed procedures. This time includes time spent with the patient as well as time spent documenting in the medical record, reviewing patient's records and tests, obtaining history, placing orders, communicating with other healthcare professionals, counseling the patient,  family, or caregiver, and/or care coordination for the diagnoses above.

## 2021-08-09 NOTE — Patient Instructions (Signed)
Dear Kayla Frey,    It was good to see you in clinic today. This document is your After Visit Summary, which includes what we discussed in clinic today, what the next steps are, and a list of your medications.     Things you should do after you leave clinic today:    - Please get an XR of your left knee completed today across the street    - Today we performed a PAP exam. Your results should be back in about one week    - You may have some mild abdominal cramping and spotting today for the next 24 hours. Please feel to take ibuprofen as needed for the cramping and use a pad for any spotting    Radiology     Scheduling   To schedule your exam, please call [facility] Scheduling?[phone].     Interpreting services are available for all patients. Please let us know that you would like an interpreter when scheduling your exam.     Select exams can be self-scheduled in MyChart at [email or web address]       X-Ray         X-ray services are available on a walk-in basis, no appointment is needed. X-Ray locations:     [facility] / Adult [facility])   [address] [place], [address], [place], [place] [address]   Ph: [phone]   Hours:?M-F, 8am-5pm     [facility]   [address]nd [address] & [address]rd [place], [address], [place], [place] [address]   Ph: [phone]   Hours: M-F, 8-noon, [facility]    [address], 2nd floor, [place], [place] [address]   Ph: [phone]   Hours:  M-F, 8am-5pm     [facility]   [address]rd Floor, [address], [place], [place] [address]   Ph: [phone], option#1   Hours: M-F, 8am-11:45am, 12:45pm-4pm     [facility]   [address] [place], [address], [place], [place] [address]   Ph: [phone]    M-F, 8am-8pm; Belenda Cruise, 8-2; major holidays, 9am-3pm       [facility] and [facility]   [address], [place], [place] [address]   Ph:  [phone], option #9   Hours: M-F, 8am-5pm     [facility]   [address] [place], [place] [address]   Ph: [phone]    Hours: M-F, 8am-5pm         Radiology Locations   For more information information, including hours and directions, visit [email or web address]         [facility]        [facility]     [address] [place], [address]   [place], [place] [address]    Services: Breast Ultrasound, Diagnostic Mammography, Screening Mammography, Ultrasound, X-ray      [facility]    [facility]     [address]   [place], [place] [address]     Pediatric services: CT, Fluoroscopy, MRI, Nuclear Medicine, Interventional Radiology, Ultrasound, X-ray     [facility]   [address] [place], [place], [place] [address]   Hours: M-F, 8am-5pm    Services: X-ray  [place]   [place]   (Free parking provided at this location)   [facility] [place]   [address], [place] [address]     Services: [place], MRI, Nuclear Medicine, [place], [place]   [address], [place] [address]     Services: [place], [place], [place] [address]     Pediatric services: CT, Fluoroscopy, MRI, Nuclear Medicine, PET CT, Ultrasound, X-ray     [facility]   [facility]     [address] [place], [place] [address]    Services: CT, Fluoroscopy, MRI, Nuclear Medicine, PET CT, Ultrasound, X-ray     [facility]     [address], [address] [place], [place] [address]    Services: CT, Bone Densitometry, Interventional Radiology, MRI, Ultrasound, X-ray      [facility]     [address] [place], [place] [address]    Services: Breast Ultrasound, Diagnostic Mammography, Screening Mammography     [facility]     [address] [place] [address]   Services: Bone Densitometry, Fluoroscopy, MRI     [facility]   (Free parking provided at this location)     [address], [place] [address]    Services: Bone Densitometry, [place], [place]. [facility]     [address] [place], [place] [address]     Services: CT, Bone Densitometry, MRI, [facility]    [address], [place], [place] [address]    Services: Fluoroscopy, Interventional Radiology, [place]     [address], [address] [place], [place] [address]    Service: [place]     [address] [place], [place] [address]    Service: Ultrasound     [facility]     [address], [place], [place] [address]    Services: CT, [facility]     [address], [place], [place] [address]     Services: CT, Fluoroscopy, Interventional Radiology, MRI, Neuro Interventional, Nuclear Medicine, PET, [place] [address]     [place], [place], [place] [address]    Services: Ultrasound, [place] [address]     [address], [place], [place] [address]     Services: [place], [place]        [facility]     [address] [place] [address]     Service: X-ray, Screening Mammography         Please don't hesitate to reach out with any questions or concerns. MyChart is a good way to reach me or a colleague for non-urgent questions (usually a turn-around time of 3 days). For more urgent matters, it's best to call the clinic at [phone]. There is always  a nurse on-call to triage questions during regular business hours and schedule same-day appointments, and there is a physician on call 24/7.      Please let me know if you have any questions or concerns going forward.     Best,    Kary Kos, MD

## 2021-08-09 NOTE — Telephone Encounter (Signed)
[place] scanned and mailed the form back to pt.

## 2021-08-10 LAB — CHLAMYDIA TRACHOMATIS/NEISSERIA GONORRHOEAE RNA
CT RNA: DETECTED — AB
Comments: NOT DETECTED
GC RNA: NOT DETECTED

## 2021-08-10 MED ORDER — DOXYCYCLINE MONOHYDRATE 100 MG CAPSULE
100 | ORAL_CAPSULE | Freq: Two times a day (BID) | ORAL | 0 refills | Status: AC
Start: 2021-08-10 — End: 2021-08-17

## 2021-08-10 NOTE — Progress Notes (Signed)
Pt w first PAP 08/09/21, tested for G/C as well. Resulted + for chlamydia, neg for gonorrhea. Currently asymptomatic    Called and left a voicemail the following instructions    - get tested for HIV and HCG now  - start doxycycline 100mg  BID X 7 days  - re-test for C/G in 3 mo to confirm negative  - refrain from SA until trmt course completed  - notify any recent sexual partners so that they can get tested and receive treatment as well    Also sent information in a mychart msg

## 2021-08-13 ENCOUNTER — Encounter: Payer: MEDICAID | Primary: Student in an Organized Health Care Education/Training Program

## 2021-08-13 DIAGNOSIS — A749 Chlamydial infection, unspecified: Secondary | ICD-10-CM

## 2021-08-13 LAB — HCG PREGNANCY, SERUM, QUANTITATIVE >= 18 YEARS: HCG Pregnancy, Serum, >= 18 years: <1 IU/L (ref <5)

## 2021-08-13 NOTE — Telephone Encounter (Signed)
Provided pt w lab location and hours

## 2021-08-13 NOTE — Result Quicknote (Signed)
I reviewed the recent tests. There is nothing concerning about the results.

## 2021-08-14 LAB — HIV ANTIBODY AND ANTIGEN COMBINATION TEST: HIV Ag/Ab Combo: NEGATIVE

## 2021-08-15 LAB — CHLAMYDIA TRACHOMATIS/NEISSERIA GONORRHOEAE RNA
CT RNA: DETECTED — AB
Comments: NOT DETECTED
GC RNA: NOT DETECTED

## 2021-08-15 NOTE — Telephone Encounter (Signed)
Already reported chlamydia + result to patient and started trmt

## 2021-08-30 NOTE — Telephone Encounter (Signed)
Pt HPV pos. Cytology w insufficient sample to run. Repeating PAP in March. Shared results and next steps w pt     HPV DNA, Genotype 16   Not detected Not detected    Comment: Nucleic Acid(DNA) testing by PCR   HPV DNA, Genotype 18   Not detected Not detected    HPV DNA, High Risk (non 16/18)   Not detected DETECTEDAbnormal

## 2021-08-31 LAB — HPV HIGH RISK WITH GENOTYPE 16/18
HPV DNA, Genotype 16: NOT DETECTED
HPV DNA, Genotype 18: NOT DETECTED
HPV DNA, High Risk (non 16/18): DETECTED — AB

## 2021-09-05 NOTE — Progress Notes (Unsigned)
[place] form scanned and mailed the form back to pt.

## 2021-09-06 NOTE — Telephone Encounter (Signed)
Pt interested in HPV vaccine. No record of HPV vaccination status in our records. Asked pt to access prior vaccination records to confirm if has been vaccination. Explained that rec for vaccines 13-35yo but can be shared decision and approved for later ages now as well.     Pt will look up vaccine records. If unvaccinated, very interested in getting HPV vaccine at upcoming visit.     Has upcoming appt 09/10/21 for repeat PAP. Will send provider msg re HPV vaccination discussion

## 2021-09-10 ENCOUNTER — Ambulatory Visit
Admit: 2021-09-10 | Discharge: 2021-09-21 | Payer: MEDICAID | Attending: Resident | Primary: Student in an Organized Health Care Education/Training Program

## 2021-09-10 DIAGNOSIS — Z124 Encounter for screening for malignant neoplasm of cervix: Secondary | ICD-10-CM

## 2021-09-10 DIAGNOSIS — R87611 Atypical squamous cells cannot exclude high grade squamous intraepithelial lesion on cytologic smear of cervix (ASC-H): Secondary | ICD-10-CM

## 2021-09-10 DIAGNOSIS — Z01419 Encounter for gynecological examination (general) (routine) without abnormal findings: Secondary | ICD-10-CM

## 2021-09-10 NOTE — Patient Instructions (Signed)
[name]    It was great to meet you today in clinic. Thank you for coming in for your repeat pap smear. We will follow up with the results as soon as they are available. Please feel free to message [place] at any time with questions or concerns.     Best,   Dr. Jaci Standard

## 2021-09-10 NOTE — Progress Notes (Addendum)
Subjective    Kayla Frey is a 36 y.o. female who presents with the following:      History of Present Illness     Coming in for repeat pap after insufficient sample from last time. No change in sexual partner. No abnormal discharge. No abnormal bleeding. Cycles have been regular. No urinary symptoms. No pelvic pain. Strong family history of gynecological cancer.           Objective          Physical Exam  Vitals reviewed. Exam conducted with a chaperone present.   Constitutional:       General: She is not in acute distress.     Appearance: Normal appearance.   Genitourinary:     General: Normal vulva.      Labia:         Right: No rash, tenderness, lesion or injury.         Left: No rash, tenderness, lesion or injury.       Vagina: Normal. No signs of injury. No vaginal discharge.      Cervix: Normal.   Skin:     General: Skin is warm and dry.   Neurological:      Mental Status: She is alert.         Review of Prior Testing       Assessment and Plan        Kayla Frey is a 36 y.o. woman with history of PCOS presenting in clinic for pap smear.     Cervical cancer screening  Patient had pap in December however sample was insufficient for testing so patient re-presents for pap today. Irregular menses due to PCOS but cycles have been regular recently. SA with female partner in monogamous relationship, however has not had sex recently. Does not use any form on contraception. Has never been pregnant. No GU symptoms since last visit in December.   Pap completed during this visit with no complications. Required large speculum.   - follow up cytology and HPV    RTC     I have discussed this patient with Dr. Debera Lat    Kayla Frey M. Jaci Standard, MD   PGY-1  [facility]

## 2021-09-11 NOTE — Telephone Encounter (Signed)
Current pended order:  escitalopram oxalate (LEXAPRO) 20 mg tablet  Take 1 tablet (20 mg total) by mouth every morning for 90 days .crush tablet before taking for 3 months.    Last time approved:  escitalopram oxalate (LEXAPRO) 20 mg tablet  Take 1 tablet (20 mg total) by mouth every morning for 90 days .crush tablet before taking for 3 months.  Dispense: 90 tablet Refill: 0  [facility] 1545 2 by Louanne Skye, JESSICA ALCALAY on 06/19/21    Current pended order:  LORazepam (ATIVAN) 1 mg tablet  Take 1 tablet (1 mg total) by mouth once as needed for Anxiety (20-30 minutes before PAP) for up to 1 dose    Last time approved:  LORazepam (ATIVAN) 1 mg tablet  Take 1 tablet (1 mg total) by mouth once as needed for Anxiety (20-30 minutes before PAP) for up to 1 dose  Dispense: 2 tablet Refill: 0  [facility] 1545 2 by Kary Kos ALCALAY on 07/26/21        Last office visit:    09/10/2021   Last video visit:     05/23/2020   Last scheduled telephone encounter:  Visit date not found    Next appointment:  10/23/2021 Elsie Amis, MD    Last  BP:  BP Readings from Last 1 Encounters:   09/10/21 (!) 136/86       Last Labs:    Lab Results   Component Value Date    Creatinine 0.87 07/26/2021    Sodium, Serum / Plasma 139 07/26/2021    Potassium, Serum / Plasma 4.1 07/26/2021    Hemoglobin A1c 4.5 07/26/2021    Cholesterol, Total 199 07/26/2021    LDL Cholesterol 133 (H) 07/26/2021    Cholesterol, HDL 53 07/26/2021    Thyroid Stimulating Hormone 1.15 01/02/2020    Hemoglobin 10.8 (L) 07/26/2021    eGFR - low estimate 90 10/24/2020    eGFR - high estimate 104 10/24/2020       If patient has not been seen in clinic for 12 months or more I have routed encounter to the administrative team to book a follow-up appointment.

## 2021-09-12 MED ORDER — LORAZEPAM 1 MG TABLET
1 | ORAL_TABLET | Freq: Once | ORAL | 0 refills | Status: DC | PRN
Start: 2021-09-12 — End: 2021-09-25

## 2021-09-12 MED ORDER — ESCITALOPRAM 20 MG TABLET
20 | ORAL_TABLET | Freq: Every morning | ORAL | 0 refills | Status: DC
Start: 2021-09-12 — End: 2021-11-27

## 2021-09-16 NOTE — Telephone Encounter (Signed)
Ordered transvaginal US per pt request given strong s/f PCOS and fits clinical dx. Provided more information and counseling about managing co-morbid conditions

## 2021-09-17 MED ORDER — NYSTATIN 100,000 UNIT/GRAM TOPICAL POWDER
100000 | Freq: Two times a day (BID) | TOPICAL | 5 refills | Status: DC
Start: 2021-09-17 — End: 2022-08-08

## 2021-09-20 MED ORDER — METRONIDAZOLE 500 MG TABLET
500 | ORAL_TABLET | Freq: Three times a day (TID) | ORAL | 0 refills | Status: AC
Start: 2021-09-20 — End: 2021-09-27

## 2021-09-20 NOTE — Telephone Encounter (Signed)
Received notification about positive trichomonas on pap smear. Also with findings of "Atypical squamous cells, cannot exclude high grade SIL (ASC-H)." HPV co-testing pending.    Discussed need to follow up with gynecology, and next steps pending HPV results. She already has gynecology appointment 3/2 but she will call to try and move it up sooner.     For trichomonas infection, she denies pelvic pain, vaginal discharge changes, or dyspareunia. Monogamous with one female partner, both have abstained from sex since chlamydia diagnosis 12/20 (she's completed those antibiotics, partner tested negative for GCCT).  - sent 7 days metronidzole 500mg  BID to pharmacy, allergies reviewed  - counseled on abstinence from sexual activity until completion of abx and partner is tested and/or treated.  - will need test of cure in 1-3 months also needs chlamydia test of cure  - has follow up in 1 week with PCP

## 2021-09-24 NOTE — Telephone Encounter (Signed)
Pended the following medication to pt's requested pharmacy- [facility]  [place]  Please advise:  Current pended order:  LORazepam (ATIVAN) 1 mg tablet  Take 1 tablet (1 mg total) by mouth once as needed for Anxiety (20-30 minutes before PAP) for up to 1 dose    Last time approved:  LORazepam (ATIVAN) 1 mg tablet  Take 1 tablet (1 mg total) by mouth once as needed for Anxiety (20-30 minutes before PAP) for up to 1 dose  Dispense: 2 tablet Refill: 0  [facility] 1545 2 by Kary Kos ALCALAY on 09/12/21      Last office visit:    09/10/2021   Last video visit:     05/23/2020   Last scheduled telephone encounter:  Visit date not found  Last telephone visit:     09/20/2021    Next appointment:  09/26/2021 Elsie Amis, MD    Last  BP:  BP Readings from Last 3 Encounters:   09/10/21 (!) 136/86   08/09/21 132/72   07/26/21 (!) 143/64       Last Labs:    Lab Results   Component Value Date    Creatinine 0.87 07/26/2021    Sodium, Serum / Plasma 139 07/26/2021    Potassium, Serum / Plasma 4.1 07/26/2021    Hemoglobin A1c 4.5 07/26/2021    Cholesterol, Total 199 07/26/2021    LDL Cholesterol 133 (H) 07/26/2021    Cholesterol, HDL 53 07/26/2021    Thyroid Stimulating Hormone 1.15 01/02/2020    Hemoglobin 10.8 (L) 07/26/2021    eGFRcr 90 07/26/2021    eGFR - low estimate 90 10/24/2020    eGFR - high estimate 104 10/24/2020       If patient has not been seen in clinic for 12 months or more I have routed encounter to the administrative team to book a follow-up appointment.

## 2021-09-25 DIAGNOSIS — R87610 Atypical squamous cells of undetermined significance on cytologic smear of cervix (ASC-US): Secondary | ICD-10-CM

## 2021-09-25 DIAGNOSIS — R87810 Cervical high risk human papillomavirus (HPV) DNA test positive: Secondary | ICD-10-CM

## 2021-09-25 MED ORDER — LORAZEPAM 1 MG TABLET
1 mg | ORAL_TABLET | Freq: Once | ORAL | 0 refills | Status: AC | PRN
Start: 2021-09-25 — End: 2021-09-29

## 2021-09-25 NOTE — Addendum Note (Signed)
Addended by: Park Liter on: 09/25/2021 02:13 PM     Modules accepted: Orders

## 2021-09-25 NOTE — Addendum Note (Signed)
Addended by: Margret Chance on: 09/25/2021 08:31 AM     Modules accepted: Orders

## 2021-09-25 NOTE — Telephone Encounter (Signed)
Pt calling for the following reason:    States she has the GYN appt schedule for tomorrow and needs her ATIVAN RX 2 pils to be send to [facility] number to reach caller at: same   Patient's home phone number: [phone]       OTT Reason for Call: Rx refill call from patient

## 2021-09-25 NOTE — Telephone Encounter (Signed)
One time ativan prn rx sent in to new pharmacy for pt to take prior to transvaginal US

## 2021-09-25 NOTE — Telephone Encounter (Signed)
Reviewed documentation in this encounter  Will leave for NP Krista Blue to review and further assist.

## 2021-09-25 NOTE — Progress Notes (Deleted)
HPI:  Kayla Frey is a 36 y.o. No obstetric history on file. here for discussion of Pap results.     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    She has no past medical history of Acute thromboembolism of deep veins of lower extremity (CMS code), Coronary artery disease, Gallbladder disease, Gallstones, Gout, Hypercholesterolemia, Migraine headache, or Osteoarthritis.    She  has no past surgical history on file.    Physical Exam:    There were no vitals taken for this visit.    General: {Exam; general:16600::"alert","appears stated age","cooperative"}, NAD  No thyromegaly  Breast Exam:   Abdomen: soft, NT, no masses or organomegaly  Ext gen: {exam; genital female:30844}  Vagina: {exam; pelvic vaginal:30846}  Cervix: {cervix:315904::"normal appearing cervix without discharge or lesions"}  Uterus: {exam; uterus:14489}  Adnexae: {exam; adnexa:12223}  Rectal: deferred  Extremeties: {Numbers; 1-4+ (neg and trace):16140} edema    Assessment/ Plan:  ***    #HCM:    No orders of the defined types were placed in this encounter.           Follow up:  {follow up:21550}

## 2021-09-26 ENCOUNTER — Ambulatory Visit
Payer: MEDICAID | Attending: Student in an Organized Health Care Education/Training Program | Primary: Student in an Organized Health Care Education/Training Program

## 2021-09-26 LAB — HPV HIGH RISK WITH GENOTYPE 16/18
HPV DNA, Genotype 16: NOT DETECTED
HPV DNA, Genotype 18: NOT DETECTED
HPV DNA, High Risk (non 16/18): DETECTED — AB

## 2021-09-26 NOTE — Telephone Encounter (Signed)
Pt notified that [facility] pharmacy does not dispense ativan. Asked pt to send me preferred pharmacy and I will resend

## 2021-09-26 NOTE — Progress Notes (Deleted)
Subjective    Kayla Frey is a 36 y.o. female who presents with the following:           History of Present Illness     #Chlamydia  - positive for chlamydia 08/09/21  - G, HIV and HCG neg  - s/p 7 day course of doxycycline  - notified partner  - re-test 10/2020 to confirm neg    #Trichomonis   - 09/10/21 + trichomonas, discussed w provider and no sxs (no pelvic pain, vaginal d/c, dysuria, etc)  - s/p 7 day course of metronidazole   - re-test in 1-3 mo to confirm cured    #Abnormal PAP  - 09/10/21 PAP Atypical squamous cells, cannot exclude high grade SIL (ASC-H)  -08/09/21 HPV 16 neg, 18 neg, HPV high risk non 16/18 positive   - referred to gyn for colposcopy                 Objective          Physical Exam      Review of Prior Testing       Assessment and Plan       Problem List Items Addressed This Visit     ASCUS with positive high risk HPV cervical    Overview     - 09/10/21 PAP Atypical squamous cells, cannot exclude high grade SIL (ASC-H)  -08/09/21 HPV 16 neg, 18 neg, HPV high risk non 16/18 positive   - referred to gyn for colposcopy              Chlamydia infection    Overview     - positive for chlamydia 08/09/21  - G, HIV and HCG neg  - s/p 7 day course of doxycycline  - notified partner  - re-test 10/2020 to confirm neg             Trichomonal vaginitis    Overview     - 09/10/21 + trichomonas, discussed w provider and no sxs (no pelvic pain, vaginal d/c, dysuria, etc)  - s/p 7 day course of metronidazole   - re-test in 1-3 mo to confirm cured                                   I spent a total of *** minutes on this patient's care on the day of their visit excluding time spent related to any billed procedures. This time includes time spent with the patient as well as time spent documenting in the medical record, reviewing patient's records and tests, obtaining history, placing orders, communicating with other healthcare professionals, counseling the patient, family, or caregiver, and/or care coordination  for the diagnoses above.

## 2021-09-29 MED ORDER — LORAZEPAM 1 MG TABLET
1 | ORAL_TABLET | Freq: Once | ORAL | 0 refills | Status: DC | PRN
Start: 2021-09-29 — End: 2021-10-17

## 2021-09-29 NOTE — Addendum Note (Signed)
Addended by: Kary Kos on: 09/29/2021 05:34 PM     Modules accepted: Orders

## 2021-09-29 NOTE — Telephone Encounter (Signed)
Resent to [facility] on 3rd

## 2021-10-16 ENCOUNTER — Ambulatory Visit
Admit: 2021-10-16 | Payer: MEDICAID | Attending: Obstetrics & Gynecology | Primary: Student in an Organized Health Care Education/Training Program

## 2021-10-16 DIAGNOSIS — R87618 Other abnormal cytological findings on specimens from cervix uteri: Secondary | ICD-10-CM

## 2021-10-16 DIAGNOSIS — N898 Other specified noninflammatory disorders of vagina: Secondary | ICD-10-CM

## 2021-10-16 NOTE — Patient Instructions (Signed)
I wanted to let you know that your pap results came back abnormal. This type of result is common and for follow up needs a closer look at the cervical cells. This is done in the office by a procedure called a colposcopy. There will be cervical biopsy samples taken during the procedure that are a few millimeters in size. These results confirm if the cells are growing abnormal or not.    What is an abnormal Pap test?    A Pap test, or Pap smear, is part of a womans routine physical exam. It is the best way to prevent cervical cancer, because it can find cells on your cervix that could turn into cancer. The cervix is the lower part of the uterus that opens into the vagina.  When your doctor says that your Pap test was abnormal, it means that the test found some cells on your cervix that do not look normal. It does not mean that you have cancer.     What causes an abnormal Pap test?  Most of the time, abnormal cell changes on the cervix are caused by certain types of human papillomavirus, or HPV. HPV is a sexually transmitted disease. Usually these cell changes go away on their own, but certain types of HPV have been linked to cervical cancer. Thats why its important for women to have regular Pap tests. It takes many years for cell changes in the cervix to turn into cancer.    Do abnormal cell changes cause symptoms?  The cell changes themselves don't cause symptoms. HPV, which causes most abnormal Pap tests, usually doesn't cause symptoms either. This is why regular Pap tests are so important.     WHAT IS COLPOSCOPY?    You are having a colposcopy to follow up on an abnormal Pap smear.    It is a procedure used to locate the abnormal cells on the cervix, vagina or vulva.   Colposcopy is an instrument that looks like binoculars mounted on a stand, making it easier to identify these abnormal tissues. It is used outside the vagina and will not touch you body. If abnormal tissue is seen, one or more small biopsies  (samples of tissue about the size of a grain of rice) are taken and sent to the lab for examination. This will help determine if treatment is necessary.    What happens during colposcopy and and biopsy?   This office procedure takes about 15 minutes.  * You will lie down on the exam table as you do for a pelvic exam.  * Your provider will put a speculum in your vagina (the instrument used during the Pap smear.)  * The provider will swab your cervix with white vinegar to make any abnormal tissue easier to see, and then will examine your cervix and vaginal walls.  * Biopsies may be taken by snipping a small piece of tissue. Tissue may be scraped from inside the cervical canal. You may feel brief cramping.    How do I prepare?  * Schedule your appointment for a time when you are not having your period.  * Do not douche, have sex or use vaginal creams for at least 24 hours before the appointment.  * I recommend taking ibuprofen 600mg , or naproxen 500mg  about 1 hour before your appointment.  If you can't take these medications, acetaminophen (tylenol) 650-1000mg  can be taken instead.

## 2021-10-16 NOTE — Progress Notes (Signed)
10/15/2021     9:57 AM   OBGYN Patient Questionnaire   Reason for Visit Abnormal pap with a typical cells   Vaginal Pain No   Currently Pregnant? No
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
## 2021-10-17 MED ORDER — LORAZEPAM 1 MG TABLET
1 | ORAL_TABLET | Freq: Once | ORAL | 0 refills | 10.00000 days | Status: AC | PRN
Start: 2021-10-17 — End: 2021-11-13

## 2021-10-17 NOTE — Telephone Encounter (Signed)
Current pended order:  LORazepam (ATIVAN) 1 mg tablet  Take 1 tablet (1 mg total) by mouth once as needed for Anxiety (20-30 minutes before transvaginal US) for up to 1 dose    Last time approved:  LORazepam (ATIVAN) 1 mg tablet  Take 1 tablet (1 mg total) by mouth once as needed for Anxiety (20-30 minutes before transvaginal US) for up to 1 dose  Dispense: 2 tablet Refill: 0  [facility] 1545 2 by Kary Kos ALCALAY on 09/29/21      Last office visit:    09/10/2021   Last video visit:     05/23/2020   Last scheduled telephone encounter:  Visit date not found  Last telephone visit:     09/20/2021    Next appointment:  10/23/2021 Elsie Amis, MD    Last  BP:  BP Readings from Last 3 Encounters:   10/16/21 (!) 152/92   09/10/21 (!) 136/86   08/09/21 132/72       Last Labs:    Lab Results   Component Value Date    Creatinine 0.87 07/26/2021    Sodium, Serum / Plasma 139 07/26/2021    Potassium, Serum / Plasma 4.1 07/26/2021    Hemoglobin A1c 4.5 07/26/2021    Cholesterol, Total 199 07/26/2021    LDL Cholesterol 133 (H) 07/26/2021    Cholesterol, HDL 53 07/26/2021    Thyroid Stimulating Hormone 1.15 01/02/2020    Hemoglobin 10.8 (L) 07/26/2021    eGFRcr 90 07/26/2021    eGFR - low estimate 90 10/24/2020    eGFR - high estimate 104 10/24/2020       If patient has not been seen in clinic for 12 months or more I have routed encounter to the administrative team to book a follow-up appointment.

## 2021-10-18 LAB — CHLAMYDIA TRACHOMATIS/NEISSERIA GONORRHOEAE RNA
CT RNA: NOT DETECTED
Comments: NOT DETECTED
GC RNA: NOT DETECTED

## 2021-10-19 LAB — TRICHOMONAS VAGINALIS RNA
T. Vaginalis RNA: NOT DETECTED
T. Vaginalis RNA: NOT DETECTED

## 2021-10-23 ENCOUNTER — Ambulatory Visit
Admit: 2021-10-23 | Payer: MEDICAID | Attending: Student in an Organized Health Care Education/Training Program | Primary: Student in an Organized Health Care Education/Training Program

## 2021-10-23 DIAGNOSIS — N3946 Mixed incontinence: Secondary | ICD-10-CM

## 2021-10-23 NOTE — Patient Instructions (Signed)
Dear [name],    It was good to see you in clinic today. This document is your After Visit Summary, which includes what we discussed in clinic today, what the next steps are, and a list of your medications.     Things you should do after you leave clinic today:    - I have referred you to the urogyn team for evaluation of your urinary symptoms    - Please continue to do bladder training and pelvic floor exercises daily (instructions are attached)    - For your headaches try massage to jaw and neck muscles, heat packs and tylenol as needed       Please don't hesitate to reach out with any questions or concerns. MyChart is a good way to reach me or a colleague for non-urgent questions (usually a turn-around time of 3 days). For more urgent matters, it's best to call the clinic at [phone]. There is always a nurse on-call to triage questions during regular business hours and schedule same-day appointments, and there is a physician on call 24/7.      Please let me know if you have any questions or concerns going forward.     Best,    Park Liter, MD
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
## 2021-10-29 ENCOUNTER — Ambulatory Visit
Admit: 2021-10-29 | Discharge: 2021-11-15 | Payer: MEDICAID | Attending: Nurse Practitioner | Primary: Student in an Organized Health Care Education/Training Program

## 2021-10-29 DIAGNOSIS — N871 Moderate cervical dysplasia: Secondary | ICD-10-CM

## 2021-10-29 DIAGNOSIS — R87611 Atypical squamous cells cannot exclude high grade squamous intraepithelial lesion on cytologic smear of cervix (ASC-H): Secondary | ICD-10-CM

## 2021-10-29 NOTE — Progress Notes (Signed)
This is an independent service.   The available consultant for this service is Mel Almond, MD.               Kayla Frey is a 36 y.o. female No obstetric history on file. [facility].    Pap/dyplasia history:  07/2021 unsat pap  HPV+ (non 16/18)  09/10/2021 ASC-H    Smoker: former  Immunosuppression: No     MOC: condoms  Pt interested in future childbearing: Yes     Colposcopy    Date/Time: 10/29/2021 5:20 PM  Performed by: Rae Roam, NP  Authorized by: Rae Roam, NP     Procedure location: cervix    Consent:     Patient questions answered: yes      Risks and benefits of the procedure and its alternatives discussed: yes      Procedural risks discussed:  Bleeding, infection and repeat procedure    Consent obtained:  Verbal and written    Consent given by:  Patient  Indication:     Cervical indication(s): high-risk HPV positive and ASC-H    Pre-procedure:     Prep solution(s): acetic acid    Procedure:     Colposcopy with: cervical biopsy and endocervical curettage      Biopsy taken: yes      # of biopsies:  3    Biopsy location(s): 4-5, polyp, 12 oclock    Cervix visibility: fully visualized      SCJ visibility: not fully visualized      Acetowhite lesion(s): cervix      Cervical impression: high grade      Ferric subsulfate solution applied: yes    Post-procedure:     Patient tolerance of procedure:  Patient tolerated the procedure well with no immediate complications        Physical Exam  Genitourinary:                Await results  Discussed likely follow up with LEEP  Pt would likely tolerate office LEEP with pre-procedure benzo, will get from PCP.  Pt would not be appropriate candidate for ablative therapy.  Given ASC-H or HSIL pap and unsatisfactory colpo exam, consider diagnostic excision or cytology review if CIN1 or benign path results.    Will follow up with pt with pathology results via Mychart.    Rae Roam NP  [phone]

## 2021-10-30 LAB — POCT URINE PREGNANCY, >= 18 YEARS (~~LOC~~): POCT Preg Test, Ur: NEGATIVE

## 2021-10-30 NOTE — Telephone Encounter (Signed)
Not yet due for mammogram. Notified pt

## 2021-10-30 NOTE — Telephone Encounter (Signed)
Reordered CBC and iron studies for Kayla Frey. Notified pt

## 2021-10-30 NOTE — Telephone Encounter (Signed)
Pt requesting mammo order, has not had one previously/no known hx. Please advise.

## 2021-11-06 NOTE — Telephone Encounter (Signed)
Pt unfortunately unable to make it for scheduled [facility] appt today. Sent msg and instructions for scheduling follow up visit w me to discuss depression meds and wrist sxs

## 2021-11-06 NOTE — Progress Notes (Unsigned)
Subjective    Kayla Frey is a 36 y.o. female who presents with the following:           History of Present Illness   ***          10/23/2021     8:36 AM 10/25/2020    10:58 AM 03/01/2020    10:26 AM 01/11/2020    12:28 PM 11/23/2019     8:25 PM   PHQ-9 Total   PHQ-9 Total Score 2 1 2 2 2              Objective          Physical Exam      Review of Prior Testing       Assessment and Plan       {Assessment and plan options:32133}                {Complexity of data reviewed (optional):28233::" "}    {Time spent options (optional):28235::" "}      I performed this evaluation using real-time telehealth tools, including a live video Zoom connection between my location and the patient's location. Prior to initiating, the patient consented to perform this evaluation using telehealth tools.     {Failed Video Visit Note (optional):[phone]::" "}

## 2021-11-13 MED ORDER — LORAZEPAM 1 MG TABLET
1 | ORAL_TABLET | Freq: Once | ORAL | 0 refills | 10.00000 days | Status: DC | PRN
Start: 2021-11-13 — End: 2022-01-16

## 2021-11-13 NOTE — Telephone Encounter (Signed)
Refilled 2 doses of prn ativan for prior to LEEP

## 2021-11-27 ENCOUNTER — Telehealth
Admit: 2021-11-27 | Payer: MEDICAID | Attending: Student in an Organized Health Care Education/Training Program | Primary: Student in an Organized Health Care Education/Training Program

## 2021-11-27 DIAGNOSIS — F419 Anxiety disorder, unspecified: Secondary | ICD-10-CM

## 2021-11-27 MED ORDER — ESCITALOPRAM 20 MG TABLET
20 | ORAL_TABLET | Freq: Every morning | ORAL | 3 refills | 90.00000 days | Status: AC
Start: 2021-11-27 — End: 2022-11-22

## 2021-11-27 MED ORDER — VASOPRESSIN 20 UNIT/ML INTRAVENOUS SOLUTION
20 | Freq: Once | INTRAVENOUS | Status: DC
Start: 2021-11-27 — End: 2022-02-07

## 2021-11-27 MED ORDER — LIDOCAINE HCL 10 MG/ML (1 %) INJECTION SOLUTION
10 | Freq: Once | INTRAMUSCULAR | Status: AC
Start: 2021-11-27 — End: 2021-11-28
  Administered 2021-11-28: 19:00:00 20 mL
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
## 2021-11-27 NOTE — Patient Instructions (Signed)
Dear Kayla Frey,    It was good to see you in clinic today. This document is your After Visit Summary, which includes what we discussed in clinic today, what the next steps are, and a list of your medications.     Things you should do after you leave clinic today:    - Please increase your lexapro dose from 20 to 30mg  once daily    - Please complete the attached mood questionnaires    - Please let me know if the wrist bump comes back or starts to bother you and I can order an ultrasound for you    - We will have follow up in 3 months to check in on mood symptoms      Please don't hesitate to reach out with any questions or concerns. MyChart is a good way to reach me or a colleague for non-urgent questions (usually a turn-around time of 3 days). For more urgent matters, it's best to call the clinic at [phone]. There is always a nurse on-call to triage questions during regular business hours and schedule same-day appointments, and there is a physician on call 24/7.      Please let me know if you have any questions or concerns going forward.     Best,    [phone], MD

## 2021-11-28 ENCOUNTER — Ambulatory Visit
Admit: 2021-11-28 | Payer: MEDICAID | Attending: Physician | Primary: Student in an Organized Health Care Education/Training Program

## 2021-11-28 ENCOUNTER — Ambulatory Visit: Admit: 2021-11-28 | Payer: MEDICAID | Primary: Student in an Organized Health Care Education/Training Program

## 2021-11-28 DIAGNOSIS — D508 Other iron deficiency anemias: Secondary | ICD-10-CM

## 2021-11-28 DIAGNOSIS — N871 Moderate cervical dysplasia: Secondary | ICD-10-CM

## 2021-11-28 LAB — COMPLETE BLOOD COUNT WITH DIFFERENTIAL
Abs Basophils: 0.03 10*9/L (ref 0.00–0.10)
Abs Eosinophils: 0.1 10*9/L (ref 0.00–0.40)
Abs Imm Granulocytes: 0.03 10*9/L (ref <0.10)
Abs Lymphocytes: 2.98 10*9/L (ref 1.00–3.40)
Abs Monocytes: 0.47 10*9/L (ref 0.20–0.80)
Abs Neutrophils: 5.41 10*9/L (ref 1.80–6.80)
Hematocrit: 35.7 % — ABNORMAL LOW (ref 36.0–46.0)
Hemoglobin: 11.7 g/dL — ABNORMAL LOW (ref 12.0–15.5)
MCH: 31 pg (ref 26.0–34.0)
MCHC: 32.8 g/dL (ref 31.0–36.0)
MCV: 94 fL (ref 80–100)
MPV: 10.9 fL (ref 9.1–12.6)
Platelet Count: 283 10*9/L (ref 140–450)
RBC Count: 3.78 10*12/L — ABNORMAL LOW (ref 4.00–5.20)
RDW-CV: 12.8 % (ref 11.7–14.4)
WBC Count: 9 10*9/L (ref 3.4–10.0)

## 2021-11-28 LAB — FERRITIN: Ferritin, Serum/Plasma: 131 ug/L (ref 12–160)

## 2021-11-28 LAB — POCT URINE PREGNANCY, >= 18 YEARS (~~LOC~~)
POCT Preg Test, Ur: NEGATIVE
Test Kit Lot Number: 5860012

## 2021-11-28 LAB — IRON, % SATURATION AND TRANSFERRIN OR TIBC (DEPENDING ON THE LAB)
% Saturation: 31 % (ref 10–47)
Iron, serum: 82 ug/dL (ref 39–179)
Transferrin, Serum/Plasma: 191 mg/dL (ref 182–360)

## 2021-11-28 MED ORDER — ACETAMINOPHEN 500 MG TABLET
500 | Freq: Once | ORAL | Status: AC
Start: 2021-11-28 — End: 2021-11-28
  Administered 2021-11-28: 19:00:00 via ORAL

## 2021-11-28 NOTE — Progress Notes (Signed)
LEEP Procedure Note    Kayla Frey is a 36 y.o. female No obstetric history on file. who presents for LEEP of the cervix for moderate grade cervical dysplasia. I reviewed her pap and biopsy results. I reviewed colposcopy findings  and have determined that the patient is a candidate for LEEP. Procedure, risks (including but not limited to infection, bleeding, preterm birth in future pregnancy, recurrent dysplasia, treatment failure), benefits and alternatives were discussed. The patient's questions were answered. Consent form was signed.  Patient is allergic to IV iodine.    Patient was identified x 2 prior to the procedure.  Urine pregnancy test was negative in the office or lab today.  Grounding pad was placed on the patient's thigh.  Coated seculum attached to suction tubing was placed and cervix visualized.  If colposcopy performed, findings:   Physical Exam  Genitourinary:           Lugol's iodine was applied to the cervix.  20 cc of 1% lidocaine with 3 units vasopression was injected circumferentially into the surface of the cervix.  Procedure started with 20 x 12 mm Loop electrode but pt felt a "hot" sensation on contact with cervix so procedure terminated.  Lidocaine 1% plain 69ml injected intracervically, waited 2 minutes  2nd attempt made, felt "warm."  Procedure stopped.  Pt strongly desired to continue  Switched to smaller 15x55mm loop, and procedure done in 2 planned passes (inferior, superior), with no pain.  ECC was performed  The specimen was sent to pathology.  The LEEP base was then cauterized with the ball cautery.  Monsel's solution was applied to the base.  Good hemostasis was noted.  EBL: 10 cc  Patient tolerated the procedure well  Post procedure pain 0  Post-procedure instructions were reviewed with the patient, including nothing per vagina for 4 weeks.    Follow up:  By MyChart for pathology results in approximately 2 weeks.  Likely follow up in 6 months for HPV-based testing (discsused  today)    Tilman Neat, MD

## 2021-11-28 NOTE — Addendum Note (Signed)
Addended by: Jone Baseman A on: 11/28/2021 02:01 PM     Modules accepted: Orders, SmartSet

## 2021-11-28 NOTE — Result Quicknote (Signed)
I reviewed the test results. There are some results that are outside the normal range. They are stable for the patient and do not require a change in the treatment plan at this time.

## 2021-11-28 NOTE — Progress Notes (Signed)
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         HPI:  Kayla Frey shares today she is actively TTC.  Lifelong oligomenorrhea.  Menarche age 36.  Then in teens & 20s would go months without menses; and when came could be very long & heavy, requiring treatment with oral progestins so stop bleeding.  However, since her bariatric surgery in 2021 things have improved.  This past year for the first time, has had onset of regular menses Qmo, not heavy or long.    She is sexually active 1 with 1 female partner x 9 months.  He fathered a child 20 years ago.  Not doing anything to prevent pregnancy.    No h/o PID or appy or pelvic surgery, but she did have chlamydia & trichomonas diagnosis 07/2021; was asymptomatic.  Treated.  TOC neg in Feb.  No symptoms currently.    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   She just had HPV vaccination in the past.   She does not smoke.  Quit tobacco 2021 for bariatric surgery, feels good abstaining, not worried about relapse currently  She is not immunocompromised due to illness or medication.   History of in utero DES exposure? no      Current Outpatient Medications:     biotin 10,000 mcg TABRAPID, Take 1 tablet by mouth, Disp: , Rfl:     calcium carbonate (CALCIUM 600 ORAL), Take 600 mg by mouth Pt reports to be consuming 2 tablets/ day for a total of 1200 mg, Disp: , Rfl:     diclofenac (VOLTAREN) 1 % gel, Apply topically 4 (four) times daily, Disp: 50 g, Rfl: 0    econazole nitrate 1 % cream, APPLY TOPICALLY TO THE AFFECTED AREA ON  ABDOMEN TWICE DAILY FOR 7 TO 14 DAYS, Disp: 85 g, Rfl: 1    EPINEPHrine (EPIPEN) 0.3 mg/0.3 mL injection, Inject 0.3 mL (0.3 mg total) into the muscle once as needed for Anaphylaxis for up to 1 dose Use as instructed, Disp: 1 each, Rfl: 6    escitalopram oxalate (LEXAPRO) 20 mg tablet, Take 1.5 tablets (30 mg total) by mouth every morning for 360 days .crush tablet before taking for 3 months., Disp: 135 tablet, Rfl: 3    ketoconazole (NIZORAL) 2 % cream, Apply topically nightly at bedtime To the abdomen, Disp: 120 g, Rfl: 5    LORazepam (ATIVAN) 1 mg tablet, Take 1 tablet (1 mg total) by mouth once as needed for Anxiety (20-30 minutes before transvaginal US) for up to 2 doses, Disp: 2 tablet, Rfl: 0    multivitamin-min-iron-FA-vit K (PROCARE BARIATRIC) 45 mg iron- 800 mcg-120 mcg capsule, Take 1 capsule by mouth nightly at bedtime, Disp: 90 capsule, Rfl: 4    nystatin (MYCOSTATIN) powder, Apply topically 2 (two) times daily To abdomen, Disp: 120 g, Rfl: 5    clotrimazole (LOTRIMIN) 1 % cream, Apply topically Twice a day Use as instructed (Patient not taking: Reported on 11/28/2021), Disp: 30 g, Rfl: 1  Current Facility-Administered Medications:     vasopressin (VASOSTRICT) injection 4 Units, 4 Units, Intravenous, Once, Tilman Neatobyn A Nevaeha Finerty, MD  Allergies as of 11/28/2021  Review status set to Review Complete by Kayla ElseJennifer A Frey on 11/28/2021      Severity Noted Reaction Type Reactions    Iodine High 11/05/2018    Hives, Itching    IV Iodine.  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  Social History     Tobacco Use    Smoking status: Former     Packs/day: 0.00     Years: 1.00     Pack years: 0.00     Types: Cigarettes     Start date: 09/14/2014     Quit date: 12/30/2019     Years since quitting: 1.9    Smokeless tobacco: Never    Tobacco comments:     taking chantix to help with smoking cessation   Substance and Sexual Activity    Alcohol use: Not Currently     Comment: has used socially in past; no history of problem use    Drug use: Never     Comment: no history of Rx med abuse or illicit substance use    Sexual activity: Yes     Partners: Male     Birth control/protection: Condom Female     Comment: Pt denies pregnancy.  No activity for at least 1 mont       Review of Systems:  Noncontributory except as noted above    Physical exam:  BP 133/75   Pulse 85   LMP 10/28/2021 Comment: Irregular periods   General: alert, appears stated age and cooperative  Ext gen: normal general appearance and normal Bartholins, urethra, Skene's  Vulva: normal  Vagina: normal mucosa without prolapse or lesions  Cervix: normal appearance, no CMT  Skin: Skin color, texture, turgor normal. No rashes or lesions  Extremities: extremities normal, atraumatic, no cyanosis or edema  Psych: appropriate judgement, behavior  Neuro: fluent speech, normal gait    Colposcopy  Procedure (colposcopy, possible cervical biopsies, possible ECC except in pregnant women), alternatives, and risks (infection, bleeding) were discussed with the patient and her questions were answered. Written consent was obtained.     The patient was identified x 2.  Urine pregnancy test was negative.  Acetic acid was applied  Lugol's iodine was applied  Colposcopic findings:   Cervix was fully visualized  SCJ was fully visualized with endocervical speculum with effort  Colposcopy was not satisfactory, ACE lesion snaking into canal    See image in separate note  for LEEP      Assessment/Plan:  1) CIN2: long discussion prior to colposcopy today regarding treatment options.  At last exam had polyp removed--with removal of polyp, if exam  were satisfactory today, discussed option of cryotherapy or even expectant management.  Counseling in detail about both these paths.  Discussed if exam unsatisfactory, strongly recommend moving forward with LEEP--this is what was done (see separate note)  In addition to risks listed below, also discussed some data re: higher chance of SAB in next 6-12 months s/p LEEP    2) Primary infertility  Undiagnosed  Likely had anovulatory component prior; now ovulatory by history, so if this was the main cause of her infertility, this may change.  She does have risk factors for tubal infertility; female factor essentially unproven.  Consider workup if desired.    Follow up:  By MyChart with LEEP results    I spent a total of 30 minutes on this patient's care on the day of their visit excluding time spent related to any billed procedures. This time includes time spent with the patient as well as time spent documenting in the medical record, reviewing patient's records and tests, obtaining history, placing orders, communicating with other healthcare professionals, counseling the patient, family, or caregiver, and/or care coordination for the diagnoses above.        Dr. Tilman Neat, MD

## 2021-11-28 NOTE — Result Quicknote (Signed)
Normal results

## 2021-11-28 NOTE — Patient Instructions (Signed)
Patient Information and Instructions- LEEP (Loop Electrosurgical Excision)    Emergency contact information: please call the clinic ([phone]) and ask to speak to the provider on call.    What was done?  Today you had a LEEP of the cervix. The LEEP site takes approximately 4 weeks to heal.     What symptoms should I expect?  You may experience mild cramping or pelvic pain for a few days. If you do experience pain, we recommend starting with a heating pad to the lower abdomen to relieve cramping.  If this is not sufficient, you can take over the counter medications:  Start with either ibuprofen (Advil, Motrin) 600mg every 6 hours, OR naproxen (Aleve) 500mg every 12 hours as needed for pain.    If this is not sufficient, or if you are unable to take NSAIDs (medications like ibuprofen and naproxen), then also take acetaminophen (Tylenol) 650mg every 6 hours, or 1000mg every 8 hours as needed for pain.    The procedure is often associated with light bleeding or spotting, which may last for several weeks.   In the first few days after the procedure, you may also see some of the topical medications we applied to the cervix come out.  These can look like black or dark brown coffee grounds, or sometimes like brown/black stained mucous.    How should I adjust my activities?  Please do not have sex or place any products in the vagina (tampons) for 4 weeks.  Please do not douche.  We recommend avoiding heavy lifting (20 pounds or more) or strenuous exercise for 2 weeks, to prevent bleeding.  If your bleeding has stopped by 2 weeks, then you may resume these activities.    You may bathe or swim any time, and light exercise (ex: walking, stretching, gentle yoga, light weights) is fine to resume immediately.    When should I call the clinic?  Call ([phone]) and ask to speak to the provider on call for urgent issues, including:  Strong pain not relieved by over-the-counter medicine  The pain should improve day by day  after the procedure. Pain that is getting worse each day, rather than better, might be a sign of infection. If you are still having a lot of pain or cramping 4 days after the procedure, please call us.  Heavy bleeding   Light bleeding and spotting is normal. If you have heavy bleeding (soaking one pad or more in an hour), lie down for one hour. If the bleeding is still as heavy when you get up, please call us. If the bleeding continues to be heavy (soaking one pad or more per hour) for more than 3 hours, you should seek immediate medical attention by going to the nearest Emergency Room.   Signs of infection, like a fever or foul vaginal discharge    When will I know the results?  The results from the LEEP will be available within 2-3 weeks. Please expect a MyChart message or letter from your provider with the results and instructions for follow-up.     You will usually be asked to return for a follow up visit in 6 months.

## 2021-11-29 LAB — CHLAMYDIA TRACHOMATIS/NEISSERIA GONORRHOEAE RNA
CT RNA: NOT DETECTED
Comments: NOT DETECTED
GC RNA: NOT DETECTED

## 2021-11-29 LAB — HERPES SIMPLEX 1,2 ANTIBODY, IGG
HSV 1 Ab, IgG: NEGATIVE
HSV 2 Ab, IgG: POSITIVE — AB

## 2021-11-29 LAB — HIV ANTIBODY AND ANTIGEN COMBINATION TEST: HIV Ag/Ab Combo: NEGATIVE

## 2021-11-29 NOTE — Telephone Encounter (Signed)
Desires pregnancy  Primary infertility  Long h/o oligomenorrhea now improved s/p bariatric surgery    > schedule vv to discuss fertility  >schedule MFM preconception consult given h/o bariatric surgery, LEEP    Tilman Neat, MD

## 2021-12-04 NOTE — Telephone Encounter (Signed)
TC to pt. Discussed HSV antibodies to genital herpes. Does not remember exposure or symptoms in the past. Is with her current partner x 1 year. Previous partner x 4 years. Considering a pregnancy, discussed daily valtrex for suppression at 36 weeks to prevent an outbreak.  Discuss with partner, partner should consider HSV blood screen as he may also have antibodies.

## 2021-12-13 MED ORDER — VALACYCLOVIR 500 MG TABLET
500 | ORAL_TABLET | Freq: Every day | ORAL | 3 refills | Status: DC
Start: 2021-12-13 — End: 2023-02-02

## 2021-12-13 NOTE — Addendum Note (Signed)
Addended by: Manna Gose R on: 12/13/2021 11:10 AM     Modules accepted: Orders

## 2022-01-14 NOTE — Telephone Encounter (Signed)
Called & Spoke to pt in order to schedule appt.     - Pt declined to see Female provider  - Wanted to see Dr. Fredia Beets but there was a scheduling interference with pt so we scheduled with colleague Dr. Allene Dillon.

## 2022-01-17 MED ORDER — LORAZEPAM 1 MG TABLET
1 | ORAL_TABLET | Freq: Once | ORAL | 0 refills | 10.00000 days | Status: DC | PRN
Start: 2022-01-17 — End: 2022-02-26

## 2022-01-17 NOTE — Telephone Encounter (Signed)
Current pended order:  LORazepam (ATIVAN) 1 mg tablet  Take 1 tablet (1 mg total) by mouth once as needed for Anxiety (20-30 minutes before transvaginal US) for up to 2 doses    Last time approved:  LORazepam (ATIVAN) 1 mg tablet  Take 1 tablet (1 mg total) by mouth once as needed for Anxiety (20-30 minutes before transvaginal US) for up to 2 doses  Dispense: 2 tablet Refill: 0  [facility] 1545 2 by Kary Kos ALCALAY on 11/13/21      Last office visit:    09/10/2021   Last video visit:     11/27/2021   Last scheduled telephone encounter:  Visit date not found  Last telephone visit:     09/20/2021    Next appointment:  01/31/2022 Warnell Forester, MD    Last  BP:  BP Readings from Last 3 Encounters:   11/28/21 133/75   10/29/21 139/65   10/16/21 (!) 152/92       Last Labs:    Lab Results   Component Value Date    Creatinine 0.87 07/26/2021    Sodium, Serum / Plasma 139 07/26/2021    Potassium, Serum / Plasma 4.1 07/26/2021    Hemoglobin A1c 4.5 07/26/2021    Cholesterol, Total 199 07/26/2021    LDL Cholesterol 133 (H) 07/26/2021    Cholesterol, HDL 53 07/26/2021    Thyroid Stimulating Hormone 1.15 01/02/2020    Hemoglobin 11.7 (L) 11/28/2021    eGFRcr 90 07/26/2021    eGFR - low estimate 90 10/24/2020    eGFR - high estimate 104 10/24/2020       If patient has not been seen in clinic for 12 months or more I have routed encounter to the administrative team to book a follow-up appointment.

## 2022-01-29 ENCOUNTER — Telehealth
Admit: 2022-01-29 | Discharge: 2022-01-29 | Payer: MEDICAID | Attending: Physician Assistant | Primary: Student in an Organized Health Care Education/Training Program

## 2022-01-29 DIAGNOSIS — Z9884 Bariatric surgery status: Secondary | ICD-10-CM

## 2022-01-29 NOTE — Progress Notes (Signed)
HPI  I saw Kayla Frey today in the [place]. 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    Weight Loss and Diet  Today's weight is No Weight Entered for Current Encounter. There is no height or weight on file to calculate BMI.. Her preoperative weight was 439 pounds. Goal weight is 220 pounds.    She weight history is as follows:   Wt Readings from Last 10 Encounters:   10/29/21 (!) 136.1 kg (300 lb)   10/23/21 (!) 136.1 kg (300 lb)   10/16/21 (!) 136.1 kg (300 lb)   08/09/21 (!) 138.9 kg (306 lb 4.8 oz)   07/26/21 (!) 142.4 kg (314 lb)   06/10/21 (!) 140.2 kg (309 lb)   12/17/20 (!) 163.3 kg (360 lb)   10/24/20 (!) 172.8 kg (380 lb 14.4 oz)   09/17/20 (!) 176.4 kg (389 lb)   06/13/20 (!) 189.6 kg (418 lb)        She is doing well on a wide range of food without significant nausea, vomiting, epigastric pain, or dysphagia. I reviewed with the patient their caloric goals, protein goals, and need to minimize carbohydrates (especially simple sugars and high glycemic index foods).     She has had no clinical symptoms of intestinal obstruction or marginal ulceration. The patient has not had any readmissions, reoperations, or interventions related to bariatric surgery since the last visit with [place].  She is taking a daily bariatric multivitamin:  Yes.  She is taking a daily calcium + vitamin D supplement:  Yes    She reports since last visit has improved awareness with of food.    Experiencing hair loss for the last 3 months. Havening difficulty with protein as she is tired of eating meat. She does substitute with protein drinks. She is continuing with her daily vitamins. Having difficulty with eating beef. She states it makes her feel super full.    Reports being more active. Feels really good about herself and her activities. She is still going to the gym and planning to do water aerobics when the pool opens.     She is  continueing to try and eat multiple times a day. She reports that she prefer es to drink her calories than consume solids due to full feeling. She will eat breakfast and have coffee with protein at lunch.  .    Plans to have excess skin removal in about 2 years. She wants to ensure she is at her goal weight at least. She is aware of the requirements and is happy with her results thus far. She feels she may be in a plateau phase right now.     Obesity-related Co-morbidity Resolution  Before bariatric surgery, her obesity-related co-morbidities were:    Diabetes - Never Diagnosed  Hypertension - Never Diagnosed  Hyperlipidemia - Never Diagnosed  Obstructive Sleep Apnea - Never Diagnosed   OSA on CPAP -- No  GERD - Never Diagnosed    Nutritional and Metabolic Laboratory Assessment    She will need to complete new labs prior to next appointment.  The most recent labs in our computer system are:    Key elements of latest CBC values... Please see Chart Review for additional result details.  Lab Results   Component Value Date    WBC Count 9.0 11/28/2021    Hemoglobin 11.7 (L) 11/28/2021    Hematocrit 35.7 (L) 11/28/2021    MCV 94  11/28/2021    Platelet Count 283 11/28/2021     Lab Results   Component Value Date    NA 139 07/26/2021    K 4.1 07/26/2021    CL 104 07/26/2021    CO2 25 07/26/2021    BUN 16 07/26/2021    CREAT 0.87 07/26/2021    GLU 85 07/26/2021     Hemoglobin A1c (PERCENT)   Date Value   07/26/2021 4.5       Albumin, Serum / Plasma (g/dL)   Date Value   07/26/2021 3.8       Alanine transaminase   Date Value Ref Range Status   07/26/2021 9 (L) 10 - 61 U/L Final     AST   Date Value Ref Range Status   07/26/2021 13 5 - 44 U/L Final     Alkaline Phosphatase   Date Value Ref Range Status   07/26/2021 79 38 - 108 U/L Final     Bilirubin, Total   Date Value Ref Range Status   07/26/2021 0.3 0.2 - 1.2 mg/dL Final     Lab Results   Component Value Date    Cholesterol, Total 199 07/26/2021    LDL Cholesterol 133 (H)  07/26/2021    Cholesterol, HDL 53 07/26/2021    Triglycerides, serum 63 07/26/2021    Chol HDL Ratio 3.8 07/26/2021     Vitamin B12 (ng/L)   Date Value   07/26/2021 532       No results found for: FOLATE, RFOL, FOLR    Calcium, total, Serum / Plasma (mg/dL)   Date Value   07/26/2021 9.1     Phosphorus, Serum / Plasma (mg/dL)   Date Value   06/04/2020 4.1       Lab Results   Component Value Date    PTH 69 07/26/2021     Vitamin D, 25-Hydroxy (ng/mL)   Date Value   07/26/2021 21       Iron, serum   Date Value Ref Range Status   11/28/2021 82 39 - 179 ug/dL Final     Transferrin, Serum/Plasma   Date Value Ref Range Status   11/28/2021 191 182 - 360 mg/dL Final     Comment:     Note: new transferrin assay implemented on October 09, 2021.     Ferritin, Serum/Plasma   Date Value Ref Range Status   11/28/2021 131 12 - 160 ug/L Final     % Saturation   Date Value Ref Range Status   11/28/2021 31 10 - 47 % Final     Lab Results   Component Value Date    Copper, serum/plasma 128 07/26/2021       Medications and Vitamins  I reviewed and changed her medications and vitamins supplements today as needed, which are:  Current Medications       Dosage    biotin 10,000 mcg TABRAPID Take 1 tablet by mouth    calcium carbonate (CALCIUM 600 ORAL) Take 600 mg by mouth Pt reports to be consuming 2 tablets/ day for a total of 1200 mg    clotrimazole (LOTRIMIN) 1 % cream Apply topically Twice a day Use as instructed    diclofenac (VOLTAREN) 1 % gel Apply topically 4 (four) times daily    econazole nitrate 1 % cream APPLY TOPICALLY TO THE AFFECTED AREA ON ABDOMEN TWICE DAILY FOR 7 TO 14 DAYS    EPINEPHrine (EPIPEN) 0.3 mg/0.3 mL injection Inject 0.3 mL (0.3 mg total) into the  muscle once as needed for Anaphylaxis for up to 1 dose Use as instructed    escitalopram oxalate (LEXAPRO) 20 mg tablet Take 1.5 tablets (30 mg total) by mouth every morning for 360 days .crush tablet before taking for 3 months.    ketoconazole (NIZORAL) 2 % cream  Apply topically nightly at bedtime To the abdomen    LORazepam (ATIVAN) 1 mg tablet Take 1 tablet (1 mg total) by mouth once as needed for Anxiety (20-30 minutes before transvaginal US) for up to 2 doses    multivitamin-min-iron-FA-vit K (PROCARE BARIATRIC) 45 mg iron- 800 mcg-120 mcg capsule Take 1 capsule by mouth nightly at bedtime    nystatin (MYCOSTATIN) powder Apply topically 2 (two) times daily To abdomen    valACYclovir (VALTREX) 500 mg tablet Take 1 tablet (500 mg total) by mouth daily For suppression.      Clinic-Administered Medications       Dosage    vasopressin (VASOSTRICT) injection 4 Units 4 Units, Intravenous, Once, MD to administer<BR>Kepp Refrigerated Until Use.          Physical Exam   Vitals: There were no vitals taken for this visit.  There is no height or weight on file to calculate BMI.  Constitutional: She is oriented to person, place, and time. She appears well-developed and well-nourished.  Abdominal: Soft. Well-healed incisions without hernia.  Psychiatric: She has a normal mood and affect. Her behavior is normal. Judgment and thought content normal.     Assessment and Plan  This is a 36 y.o. female doing well after bariatric surgery.  I would like to see the patient again in 12 months.  I have given the patient a list of laboratory work to be drawn about 2 weeks beforehand so that I can review the results with the patient at the follow-up appointment.  I will keep you apprised of her progress.    Encouraged her to eat smaller portions, but then eat more frequently through the day so she is reaching her protein goals.      She will continue with nystatin and ketoconazole for treatment of intertrigo.  Refills sent to her local pharmacy.     Will plan to refer to plastic surgery for excess skin removal once she is closer to 250lbs or less.        Below is some general information useful in the care of bariatric surgical patients:    Daily Water Requirements  Daily water goals for  includes 64 oz minimum water intake (1-2 Liters) per day.  ?  Daily Multivitamins  It is very important for patients to take a daily high potency multi vitamin for life. We recommend Bariatric Procare Multivitamin with 4m of iron (chewable or capsule) once daily (capsules can be swallowed whole). This is available only online without need of a prescription at [facility]  ?  Daily Calcium Supplementation  Patients should take 1200-1500 mg daily of calcium citrate for life. It is best to take calcium supplementation in small doses throughout the day for best absorption. There are many forms available including: tablet, chewable, soft chew, gummies etc. It is recommended that patients find a calcium citrate brand that they enjoy, that is calorie free or very low calorie. These are available at any pharmacy or online without the need of a prescription. We recommend trying the Celebrate brand Calcium citrate soft chews available at celebratevitamins.[email or web address]  ?  Daily Protein Requirements  Goal should be protein intake between 60-80 grams per day.  Some options include protein shakes or powders, lean meats ([place], fish, chicken), tofu, etc. In addition patients need green vegetable intake avoid sugars, carbohydrates, starches, pasta, rice, bread, and potatoes.  ?  Calorie Goals - Weight Loss Phase  Daily Calorie Goal of 500-700 total per day, focusing on lean proteins and leafy green vegetables during the weight loss phase, until patient have reached their desired goal weight.  ?  Calorie Goals - Weight Maintenance Phase  Daily calorie goal of around 1000 per day (+/- 100-200 calories) once patients have achieved their weight loss goal. Pateints should continue to count and log calories for life even when at goal weight to maintain desired weight. Patients may need to adjust calories up or down to maintain their weight at goal over time.    Tips for patients:  1. Identify a set time and place to count calories - make  this process a daily habit.   2. Using calorie counting aids (web-based: www.fitday.com) can help.   3. Use a personal food space at home to help concentrate on food types that others in the household may not be eating.  4. Advance meal planning using a daily / weekly spreadsheet.   5. Time your meals to less than 10 minutes per meal (eg. use an egg timer if necessary)  6. Attendance at a support group is highly encouraged. The [facility] Support Group meets the 3rd Wednesday of every month at 6pm - the location for this meeting may change depending on availability, and will be identified online at our website or by email. Patients should feel free to join this group any time and bring friends or family members. To add an email to the support group mailing list, send request to: [email or web address].   ?  For more information, please visit our website: [email or web address]     Medications  After gastric bypass, patients should never take NSAIDS because of the risk of marginal ulceration.  After sleeve gastrectomy, NSAIDS can be taken after healing has occurred, typically 3-6 months after surgery.  ?  Smoking cigarettes should never occur after bariatric surgery, because of the risk of marginal ulceration and other serious bariatric complications    Minimal alcohol intake is acceptable 3 months after surgery, but any alcohol (wine or beer) intake should be limited to 2oz. / 8 hours, as the effect of alcohol will be more pronounced. Gastric bypass patients are at increased risk for alcoholism.    Documentation of Visit Statement  I spent a total of 25 minutes in face-to-face time with the patient and in non-face-to-face activities conducted today, 01/16/22, directly related to this video visit, including reviewing records and tests, obtaining history and exam, placing orders, communicating with other healthcare professionals, counseling the patient, family, or caregiver, documenting in  the medical record, and/or care coordination for the diagnoses above.    I performed this consultation using real-time Telehealth tools, including a live video connection between my location and the patient's location. Prior to initiating the consultation, I obtained informed verbal consent to perform this consultation using Telehealth tools and answered all the questions about the Telehealth interaction.    ?

## 2022-02-07 ENCOUNTER — Telehealth
Admit: 2022-02-12 | Discharge: 2022-02-12 | Payer: MEDICAID | Attending: Physician | Primary: Student in an Organized Health Care Education/Training Program

## 2022-02-07 DIAGNOSIS — Z3169 Encounter for other general counseling and advice on procreation: Secondary | ICD-10-CM

## 2022-02-07 MED ORDER — ULIPRISTAL 30 MG TABLET
30 | ORAL_TABLET | ORAL | 1 refills | Status: DC
Start: 2022-02-07 — End: 2022-10-27

## 2022-02-07 MED ORDER — BLOOD PRESSURE MONITOR KIT
PACK | 0 refills | Status: AC
Start: 2022-02-07 — End: ?

## 2022-02-07 NOTE — Progress Notes (Addendum)
[facility] Patient Visit      Patient ID   36yo G0 with multiple co-morbidities including BMI >40 s/p gastric sleeve in 2021, CINII s/p LEEP in 11/2021, elevated BP, h/o STI, family history of colon and ovarian cancer, and anxiety and depression in setting of h/o trauma and  who presents for MFM preconception counseling.     HPI/Subjective   Regarding her history of BMI >40. h/o gastric sleeve surgery in 11/2019. Currently 298lbs. Has not been losing weight as regularly. Has had nutritional counseling- trying to eat healthy. Still working on increasing protein. Goes on 20-25 min walks 2-3 times a week and goes to gym and/or swimming 2 times a week.     Regarding her CINII, had been hesitant to do cervical cancer screening until recently. Work up revealed CINII. She underwent LEEP in 11/2021 (1.5 cm long, 2.2 cm  circumference, 0.7 cm thick). While LEEP endocervical margin was positive, ECC was not. Has plan for follow up Pap 05/2022.     Regarding her elevated BP, denies more than one BP elevation. Has wrist cuff but does not use regularly.    Regarding h/o STI, recently diagnosed with trichomonas. Notes only being with her current partner. Reports he has been tested and treated. Denies knowing about him with other people. Has not had recent syphilis testing. HIV, GC, CT testing neg.     Regarding family history of cancer, notably her MGM had ovarian cancer and patient does not know her mother well enough to ask her about genetic testing. Her father had colon cancer in his 7s.     Regarding her history of trauma and anxiety and depression, no SI/HI. Does not feel comfortable with therapy. Talks with certain family.     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     PSH     Past Surgical History:   Procedure Laterality Date    BARIATRIC SURGERY      LEEP       Medications     Current Outpatient Medications on File Prior to Visit   Medication Sig Dispense Refill    biotin 10,000 mcg TABRAPID Take 1 tablet by mouth      calcium carbonate (CALCIUM 600 ORAL) Take 600 mg by mouth Pt reports to be consuming 2 tablets/ day for a total of 1200 mg      clotrimazole (LOTRIMIN) 1 % cream Apply topically Twice a day Use as instructed 30 g 1    diclofenac (VOLTAREN) 1 % gel Apply topically 4 (four) times daily 50 g 0    EPINEPHrine (EPIPEN) 0.3 mg/0.3 mL injection Inject 0.3 mL (0.3 mg total) into the muscle once as needed for Anaphylaxis for up to 1 dose Use as instructed 1 each 6    escitalopram oxalate (LEXAPRO) 20 mg tablet Take 1.5 tablets (30 mg total) by mouth every morning for 360 days .crush tablet before taking for 3 months. (Patient taking differently: Take 40 mg by mouth  every morning .crush tablet before taking for 3 months.) 135 tablet 3    nystatin (MYCOSTATIN) powder Apply topically 2 (two) times daily To abdomen 120 g 5    econazole nitrate 1 % cream APPLY TOPICALLY TO THE AFFECTED AREA ON ABDOMEN TWICE DAILY FOR 7 TO 14 DAYS (Patient not taking: Reported on 02/07/2022) 85 g 1    LORazepam (ATIVAN) 1 mg tablet Take 1 tablet (1 mg total) by mouth once as needed for Anxiety (20-30 minutes before transvaginal US) for up to 2 doses 2 tablet 0    multivitamin-min-iron-FA-vit K (PROCARE BARIATRIC) 45 mg iron- 800 mcg-120 mcg capsule Take 1 capsule by mouth nightly at bedtime 90 capsule 4    valACYclovir (VALTREX) 500 mg tablet Take 1 tablet (500 mg total) by mouth daily For suppression. 90 tablet 3     No current facility-administered medications on file prior to visit.     Allergies   NKDA     FH     Family History       Relation Problem Comments    Mother - Mom Associate Professor) Drug abuse crack  cocaine   Mental health problem unknown type   Ovarian cysts        Father - Dad (Deceased) Cancer - colon    Colon cancer    Drug abuse crack cocaine   Heart disease    High blood pressure        Brother - Brother Alcoholism    Anxiety disorder    Asthma    Depression    Mental health problem possible bipolar disorder   Suicidality attempted suicide       Paternal Aunt - Hinton Dyer Early death    Obesity        Maternal Grandmother - Jo ann Alcoholism    Cancer    Cancer - lung    Cervical cancer    Mental health problem unknown type   Ovarian cancer        Maternal Grandfather Cancer - colon        Paternal Grandfather - Oscar Colon cancer        Cousin (Alive) Eclampsia    Miscarriages / [place]        Cousin (Alive) Breast cancer        Maternal Great-Grandmother (Alive) Uterine cancer              SH   Does use marijuana but plans to stop with pregnancy. Denies alcohol, tobacco, or other substance use. Feels safe.     ROS   Review of Systems - Negative except irregular periods- to be seen by GYN re: PCOS     Objective      There were no vitals filed for this visit.  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     Given multiple issues, focused on priorities for preconception counseling.     BMI >40 s/p gastric sleeve  We discussed that it is advisable to await 12-24 months after bariatric surgery to conceive, which she has met. We discussed that these procedures can result in deficiencies: iron, vitamin B12, folate, & calcium. There are also cases of reported fetal growth restriction.  We reviewed the infrequent complications  of bowel herniation, intestional obstruction & ischemia. We discussed that she needs to call us with abdominal pain or  emesis as she may need referral to bariatric surgeon for further evaluation.     Elevated BMI during pregnancy is associated with hypertension and preeclampsia, diabetes, venous thromboembolism, fetal macrosomia.  It is also associated with fetal anomalies, specifically neural tube defects as well as spontaneous miscarriage. Furthermore, larger body size limits the detection of fetal anomalies via ultrasound. Patients with elevated BMI are more likely to have complications of cesarean delivery (prolonged operative times which can delay an emergent delivery; increased risk of blood loss in excess of 1000 mL, wound infection/separation and endometritis). There may be anesthesia concerns related to the obese gravida as well, such as a higher spinal failure rate and more difficult intubation. Since obese women are more likely to have a macrosomic neonate they are more likely to have associated complications such as shoulder dystocia and obstetric sphincter injury. There is also evidence that obese women are more likely to have prolonged postpartum hospitalizations.       We reviewed that weight loss during pregnancy is not advised but rather limited weight gain. Simple lifestyle modifications, such as walking can be encouraged. We discussed plan to have patient see Nutritionist during pregnancy.      Recommendation preconception:   - Continue to seek healthy lifestyle and body size for herself  - Continue to optimize vitamin status i    Recommendation during pregnancy:   - Nutrition consult  - Close monitoring of CBC and vitamin B12, vitamin D, calcium, iron, folic acid  - Offer early GDM screening; if normal, repeat in 3rd trimester  - If unable to tolerate GTT, could consider fingersticks BGV, monitoring x7d  - Level II ultrasound at 18-22 weeks  - Serial growth ultrasounds and ANT in third trimester   - Both timing & mode of delivery can be per routine obstetric indications  - Avoid NSAIDs for pain control in the  postpartum period    Cervical Dysplasia s/p LEEP  We reviewed association of dysplasia and excisional procedures are associated with increased risks of SAB and preterm birth. We reviewed that some studies have demonstrated higher risk of SAB with conception within 12 months of LEEP as compared to longer intervals. We also reviewed the challenge of managing dysplasia/cancer in pregnancy (eg, doing ECC if indicated) and thus ideal to see how 6 month follow up evaluation is.     Recommendation preconception:   - Consider waiting at least 6 months and potentially up to 12 months post-LEEP to lower risk of SAB   - Follow up for cervical dysplasia screening as advised by her OBGYN    Recommendation during pregnancy:   - Review SAB and PTL precautions   - Midtrimester CL with her anatomy ultrasound    Elevated BP  With regarding the risks of HTN in pregnancy specifically, we reviewed risks of challenging to control HTN given limitations of safe medications, preeclampsia with consequent maternal organ damage, and cesarean delivery. We also reviewed that fetal/neonatal risks include preterm delivery and fetal growth restriction. These risks are primarily related to degree of hypertension and aiming for BP <140/90 during can significantly reduce risks of cHTN in pregnancy. Lifestyle management of cHTN outside and during pregnancy was reviewed, including importance of close monitoring of BP monitoring, diet considerations, and use of regular exercise (30 minutes most days per week).  Pharmacologic management of cHTN in pregnancy was also reviewed. We reviewed that we would recommend against use of ACEi/ARBs in pregnancy given current evidence of harm including teratogenicity with use in pregnancy . We reviewed that diuretics such as HCTZ are generally not used as first-line in pregnancy, though it would be reasonable to consider continuation if well controlled on them prior to pregnancy given that they are not found to be  teratogenic. We discussed that patient should not stop these medications without taking to her provider but rather talk with them about transition off of them when ready to try to conceive. We reviewed recommendation of  initiation of daily low dose aspirin at 12 weeks in future pregnancy to reduce the risk of preeclampsia likely will not be possible in patient given her h/o bariatric surgery . I  advise that patient has  baseline preeclampsia labs done (CBC, AST, ALT, Cr, urine protein/creatinine ratio) ideally prior to pregnancy in case of abnormalities that require work up though this assessment.     Recommendation preconception:   - Close monitoring of BP and goal of <140/90  - If anti-hypertensive medications are needed, ideal to choose ones compatible with pregnancy  - Urine PCR     Recommendation during pregnancy:   - Close monitoring of BP and goal of <140/90  - Review of preeclampsia precautions    AMA  We reviewed risks associated with AMA, including infertility, SAB, fetal aneuploidy, preeclampsia, fetal growth problems, and preterm birth. We reviewed options for donor gametes to lower aneuploidy risk as well as genetic screening and testing options during pregnancy.     Recommendation during pregnancy:   - Genetic screening vs testing     Family History of Cancer  See above.     - Referred to [facility] to see if patient meets criteria for genetic tested and/or screening distinct from population level recommendations      Anxiety and depression  We discussed the risks of mood disorders in pregnancy including increased risk of worsening mood, limited engagement in care, preeclampsia, fetal growth problems and preterm delivery. We reviewed the role of various modes of treatment including therapy, social support and medication. We discussed that SSRIs like Escitalopram are most commonly used medications for depression and anxiety in pregnancy and that they are typically well tolerated though  some babies will experience transient neonatal adaptation syndrome and more rarely persistent pulmonary hypertension. We dicussed that in her case, given significant improvement she is noting on the medication, it is very appropriate to continue periconception and likely throughout pregnancy and postpartum.      General Preconception Considerations  I also reviewed general recommendations to optimize preconceptual health, including ensuring up-to-date vaccine status, healthy lifestyle with adequate exercise and healthy diet, avoiding alcohol and recreational drugs and tobacco, and considering preconception carrier screening. Patient considering for prenatal carrier screening for CF and SMA as well as expanded carrier screening. We reviewed that it would be reasonable to assess for routine infection and immunity status (eg, HIV, Hep B SAg, Hep C Ab, Rubella IgG, Varicella IgG)- patient has had all recently with exception of Hep B SAg and HIV. Patient is aware of alternative family building options as noted in HPI.     We discussed that while there are risks of severe morbidity and mortality with pregnancy are higher in light of her comorbidities and are higher for Black patients as a consequence of racism, there is nothing in her medical history that suggests  that pregnancy is contraindicated and that we would be happy to support her through it. We also reviewed that she could consider adoption and gestational carrier to lower personal risks related to pregnancy.      Recommendation:   - [facility] genetic counseling referral for potential expanded carrier screening  - Consider MOC - declined method; elected to have prescription for emergency birth control if needed    I spent a total of 90 minutes on this patient's care on the day of their visit excluding time spent related to any billed procedures. This time includes time spent with the patient as well as time spent documenting in the medical  record, reviewing patient's records and tests, obtaining history, placing orders, communicating with other healthcare professionals, counseling the patient, family, or caregiver, and/or care coordination for the diagnoses above.    I performed this evaluation using real-time telehealth tools, including a live video Zoom connection between my location and the patient's location. Prior to initiating, the patient consented to perform this evaluation using telehealth tools.     Tera Mater, MD   Maternal-Fetal Medicine
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
## 2022-02-07 NOTE — Patient Instructions (Addendum)
Please continue working with your PCP and OBGYN care to optimize your health and follow up on your current medical conditions.     Here are the additional things I recommend:     Pick up a blood pressure kit (I sent a prescription to your pharmacy  Labs to check a few things including your vitamin D level, for one other STI (syphilis), and protein in your urine  Schedule an appointment in the [facility] to discussed expanded genetic carrier screening [phone])  Schedule an appointment in the [facility] [phone])   Review the information below about general pregnancy care considerations including [email or web address].org for more information about the safety of your different medications  _____    Planning a Pregnancy    Why preconception health matters  Preconception health is a woman's health before she becomes pregnant. It means knowing how health conditions and risk factors could affect a woman or her unborn baby if she becomes pregnant. For example, some foods, habits, and medicines can harm your baby -- even before he or she is conceived. Some health problems, such as diabetes, also can affect pregnancy.  Every woman should be thinking about her health whether or not she is planning pregnancy. One reason is that about half of all pregnancies are not planned. Unplanned pregnancies are at greater risk of preterm birth and low birth weight babies. Another reason is that, despite important advances in medicine and prenatal care, about 1 in 8 babies is born too early. Researchers are trying to find out why and how to prevent preterm birth. But experts agree that women need to be healthier before becoming pregnant. By taking action on health issues and risks before pregnancy, you can prevent problems that might affect you or your baby later.    Trying to Conceive:  Most pregnancies occur during the first 6 menstrual cycles of attempted conception. In the first six months of  attempting pregnancy, approximately 80% of couples will conceive; in the first 12 months, approximately 85% will conceive. Over the next 36 months, approximately 50% of remaining couples will go on to conceive spontaneously.      The fertile interval in each cycle is approximately six days and includes the five days prior to ovulation plus the day of ovulation. The highest probability of conception occurs when intercourse takes place 1-2 days prior to ovulation and on the day of ovulation.   For most women, ovulation takes place about 14 days before the first day of your period (so day 16 of the cycle for a 30d cycle, for example, or day 12 of the cycle for a 26d cycle).     If you are not using an ovulation prediction kit, I recommend sex every other day between cycle days 10-20 (the first day of your cycle is the first day of your period).      If you have irregular cycles or want a more accurate sense of your ovulation, you can buy over the counter ovulation prediction kits from a drug store.     Preparation:   Vitamins:   If you are planning to get pregnant, start taking prenatal vitamins with at least [phone] of folic acid three months prior. This will help decrease the risk of neural tube defects in the baby (defects in the development of the spinal cord).   Your prenatal vitamin or multivitamin should contain vitamin A in the form of beta carotene rather than "preformed" vitamin A forms.  Immunizations and screening:  Are your vaccinations up to date? You should have  Measles, mumps, rubella (once if not immune) - should be done before becoming pregnant  Chicken pox (if not immune) - before becoming pregnant  Hepatitis A  Hepatitis B  Influenza - safe to use during pregnancy     Weight:  Obesity (body mass index > 30)  and being underweight (body mass index <19) are associated with infertility. Being overweight can put you at risk for adverse pregnancy outcomes (like preterm labor, complications with  labor) and increased risk of maternal health consequences (like diabetes or blood pressure disorders in pregnancy). Do not try to diet during a pregnancy as this could deprive your baby of nutrients that are needed to grow and develop. Healthy weight gain or reduction can contribute to better pregnancy outcomes.      Diet & exercise  Having a healthy diet and getting moderate exercise are important both before and after pregnancy.  We recommend aiming for 30 minutes of moderate intensity exercise a day.  For diet, we recommend eating a diet rich in vegetables, whole grains, and whole fruits, and lean proteins.  Avoid processed food, liquid calories (juice, soda, smoothies), and limit "white foods" (white flour, white rice, and foods with added sugar).     Fish  We encourage women who eat fish to eat 2-3 servings of low mercury fish & seafood each week.  Aim to eat a good variety of fish & seafood, and not the same type every time.  Most of the fish & seafood commonly available in the grocery store is considered a healthy choice in pregnancy.  Check here for recommendations about good & best choices in pregnancy:  HollywoodSale.dk     If you do not eat fish regularly or at all, recommend taking a DHA supplement containing about 520m of DHA from fish or algae oil once you are pregnant.     Lifestyle  If you smoke, talk to your health care provider about this before you conceive.  Ideally, you should quit smoking before getting pregnant, as smoking can cause miscarriage and other complications.  Once you are pregnant, we recommend not drinking any alcohol. Prior to conception, we recommend minimizing alcohol intake.  Avoid using any illegal drugs when you are trying to conceive and during pregnancy.  Talk to your doctor if you need help stopping.     Coffee and tea are safe to drink during pregnancy. We recommend limiting your intake to 2 cups a day or less.      Recommend avoiding microwaving foods in plastic containers or covered in plastic wrap.  Plastic can contain BPAs (and other poorly studies plasticizers) which may cause harm to a developing fetus. Heating plastic up, storing liquids in plastic, or using scratched plastic storage containers all increase the leeching of these chemicals into food.     Genetic testing  In the [place] we offer screening for cystic fibrosis, spinal muscular atrophy, and inherited anemias to all people planning a pregnancy.  These are inherited conditions where a parent can be a "carrier" (have a mutation but be perfectly healthy).  If both parents have a mutation, then they have a 1 in 4 chance of having a child with the actually disease.    Testing for these conditions can be done prior to pregnancy or once you are pregnant.   Testing for these 3 conditions is done with a blood test.  You'll want to check with  your insurance company first to see what the cost would be to you.  Also, check to see if this can be done at the [facility] lab or if another lab is preferred (Ex: [facility] or [facility]).  Your insurance company will ask for the "CPT codes" to estimate the cost.  They are:  Hemoglobinopathy (CPT O7263072, P2554700)   Spinal Muscular Atrophy (CPT [phone])   Cystic Fibrosis (CPT 9102553706)      Alternatively, you have the option to do "expanded carrier testing" (testing for a wide variety of possible diseases) either before or during pregnancy.  This can be done with a video visit with a genetic counselor, and a test kit that is mailed to your home.    Talk to your provider about your options.    Additional Preconception Resources  [employer]necologist's Good Health Before Pregnancy: Prepregnancy Care Frequently Asked Questions: FastVelocity.si    Women's Preventative Services Initiative Well-Woman Chart    [email or web address]    MotherToBaby for information on medication, supplement and other exposures in pregnancy  [email or web address].[employer] Dietary Supplement Fact Sheets   [email or web address]

## 2022-02-18 ENCOUNTER — Other Ambulatory Visit
Admit: 2022-02-18 | Payer: MEDICAID | Attending: MS" | Primary: Student in an Organized Health Care Education/Training Program

## 2022-02-18 DIAGNOSIS — Z315 Encounter for genetic counseling: Secondary | ICD-10-CM
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
## 2022-02-18 NOTE — Progress Notes (Signed)
Reproductive Genetics Progress Notes, Consult Letters and Interpretations are filed in Chart Review under the "Notes" tab. Reproductive Genetics Ultrasounds are filed under the "Imaging" tab.

## 2022-02-18 NOTE — Progress Notes (Signed)
[facility]  [address] [place]  [address]  [place], [phone]    February 18, 2022    Serafina Royals, M.D.  [facility]  [address] [place] [address]    Re: Kayla Frey (DOB: 12-Dec-1985)  [facility] MRN: [id number]   INDICATION: preconception genetic counseling and maternal age    Dear Dr. Queen Blossom:    Your patient, Kayla Frey (DOB: May 15, 1986) was seen in consultation per  your request at our [facility] office on February 18, 2022 for genetic  counseling due to: preconception genetic counseling and maternal age. Kayla Frey is a 36 year old grav 0, para 0. I met with your patient in  consultation for 35 minutes.I performed this consultation using real-time  telehealth tools, including a live video connection between my location and the  patient's location. Prior to initiating the consultation, I obtained the  patient's informed verbal consent to perform this consultation using telehealth  tools and answered all the questions the patient had about the telehealth  interaction.    Family History: A family history was obtained and was significant for cancer,  mental illness, infertility and learning disabilities.    The patient reports that her father died of colon cancer at age 74. He  reportedly had genetic testing and "had one genetic change." however the  patient could not recall what that was today. The patient's  paternal uncle and  paternal grandfather also died of colon cancer. Additionally, the patient's  maternal grandmother had ovarian cancer. We discussed that a small proportion  of cancer is associated with inherited variants that predispose to specific  types of cancer. Multiple affected family members, multiple cancers in one  individual, and a young age at diagnosis are factors that raise concern for  an  inherited cancer syndrome. The patient recently had a MFM consultation with Dr.  Tera Mater who referred the patient to see the [facility]. I  emphasized the importance of this referral today.      The patient reports that she and her brother had learning disabilities and  required an IEP in school. The etiology of learning disabilities is often  multifactorial, including both genetic and environmental components. Without a  known cause, recurrence risks cannot be estimated.    The patient also believes she and several of her relatives have diagnoses of  attention deficit hyperactivity disorder (ADHD). ADHD is a complex disorder  with significant heterogeneity in its etiology, presentation and treatment  outcome. ADHD is considered one of the most heritable psychiatric conditions,  but no single genetic risk factor has been identified. In the presence of a  family history of ADHD, there is likely an increased recurrence risk above  background population risk. Prenatal diagnosis or genetic predisposition  testing for ADHD are not available.     The patient reports that her maternal first cousin has three children (two boys  and one girl) who all have a diagnosis of autism. Recognized etiologies of  autism spectrum disorders range from well defined genetic abnormalities to  conditions of nongenetic origin. A genetic evaluation is available for  individuals with these conditions as approximately 5-10% may result from an  underlying chromosome abnormality or genetic syndrome. However, without a known  genetic etiology, recurrence risks cannot be  estimated.    The patient reports that a different paternal first cousin (female) has a form of  muscular dystrophy that was "inherited from his mother (who is not related by  blood to the patient). If this is true, there would not be an increased risk  for recurrence for the patient's offspring. I would be happy to review health  records on the affected  individual to confirm this inheritance.      The patient reports that several members in her family, including her two of  her brothers and her mother, have mental health concerns and addiction issues.  Most mental health disorders are considered multifactorial; both genetic and  environmental factors contribute to the development of the condition.  Recurrence risks are often based on empiric risk figures. In the presence of a  family history of mental health conditions there is an increased risk of  recurrence in relatives, the degree of risk generally declining as the degree  of relationship increases. When multiple family members are affected the  recurrence risk generally increases. While numerous studies have been performed  to identify candidate genes for personality disorders, there is little  clinically applicable information available. Genes that can be used for  prenatal diagnosis or predisposition testing are often not available.     The patient also reports that several women on her mother's side have fertility  issues. There are many causes of infertility and the etiology of these  occurrences is not known to the patient. We discussed that if the patient  experiences fertility issues, she could seek a preconception consultation with  the [facility].       Genetic Counseling:     Carrier Screening:  The patient and her partner were offered carrier screening through the [facility] test in accordance with recent society [employer])  recommendations to offer expanded carrier screening to all couples regardless  of reported ethnicity.    Expanded carrier screening is available through the [place].  This panel screens for over 175 recessive and X-linked conditions via gene  sequencing, copy number analysis, and triplet repeat detection. A negative  result significantly reduces the likelihood that an individual is a carrier for  a condition on the  panel but cannot exclude it. The panel does not contain all  hereditary causes of birth defects and intellectual disability. While the goal  of this screening is not to identify personal health risks, incidental findings  have occurred.     We discussed the likelihood of receiving positive results on an expanded  carrier screening panel and the availability of concurrent carrier screening  for the patient's partner. If the patient is found to be a carrier of a  recessive condition, carrier screening for her partner would determine  reproductive risks. When both partners are carriers of a recessive condition,  there is a 25% risk of an affected pregnancy and prenatal diagnostic testing is  available.       Prenatal screening & testing:  The risk of a fetal chromosome anomaly at age 17 is 1:134.     The risks, benefits, and limitations of prenatal testing were reviewed. The  discussion included options of diagnostic testing (chorionic villus sampling  and amniocentesis) and screening (nuchal translucency, cell free DNA screening,  maternal serum AFP screening and level II ultrasound). Karyotype and/or  microarray analysis when diagnostic testing is performed. There is a background  risk of 3-4% for birth defects in  every pregnancy and no prenatal test can  identify all birth defects.     PLAN:  The patient requested that I order expanded carrier screening through the  [employer] panel.     Sincerely yours,       Barbaraann Rondo, M.S.  Genetic Counselor      Electronic Signature: Barbaraann Rondo, M.S.  Signed 02/18/2022 at 1:56 PM

## 2022-02-21 MED ORDER — ESCITALOPRAM 20 MG TABLET
20 | ORAL_TABLET | Freq: Every morning | ORAL | 1 refills | 90.00000 days | Status: DC
Start: 2022-02-21 — End: 2022-02-26

## 2022-02-21 NOTE — Telephone Encounter (Signed)
Pt wants medication to be sent to a different pharmacy.

## 2022-02-24 ENCOUNTER — Other Ambulatory Visit
Admit: 2022-02-26 | Payer: MEDICAID | Attending: MS" | Primary: Student in an Organized Health Care Education/Training Program

## 2022-02-24 DIAGNOSIS — Z7183 Encounter for nonprocreative genetic counseling: Secondary | ICD-10-CM

## 2022-02-24 NOTE — Progress Notes (Signed)
Genetic Counseling Note - Initial Encounter      Date of Service: 02/24/2022   Indication for Visit: Family history of colon cancer and cervical cancer   Referring Provider: Serafina Royals, MD  [address] [place] [address]      I performed this consultation using real-time Telehealth tools, including a live video connection between my location and the patient's location. Prior to initiating the consultation, I obtained informed verbal consent to perform this consultation using Telehealth tools and answered all the questions about the Telehealth interaction.      I had the pleasure of seeing Kayla Frey today for genetic counseling. The patient was unaccompanied to their appointment.    _________________________________________________________________    Summary and Plan:   At the conclusion of this visit, consent was obtained and Kayla Frey elected to proceed with germline genetic testing for hereditary cancer predisposition.   The patient agreed to submit a buccal (cheek) swab sample to the lab via a collection kit that will be sent to their home. Lab orders have been placed.  Results will be available approximately 2-3 weeks after the sample is received.    Plan for results disclosure: The patient will be contacted to schedule a results visit once their sample has been received.  Rarely a sample will not produce adequate quantities of DNA for testing. If this happens, an additional sample may be requested.   The patient was informed about [facility]'s billing policies and practices. They were reminded to be aware of email/phone/text correspondence from them with regard to billing and costs for genetic testing.    Cancer screening and prevention recommendations as well as considerations for family members will be provided when testing is complete.  The patient was informed about our [place] and Prevention Program Long-term Follow-up Registry. The enrollment forms were sent  electronically via [employer]uSign but has not yet enrolled.  _________________________________________________________________      Personal Cancer History:   Kayla Frey is a 36 y.o. female who has never been diagnosed with a tumor or cancer.      Additional Relevant Medical History:   Cancer Screening History:  She has not begun breast cancer or colon cancer screening given her age. We discussed today that she may be recommended for colonoscopy screening earlier than the general population given her family history of multiple individuals with colon cancer.  Cervical: She recently underwent a colposcopy/LEEP procedure on 10/29/2021 in response to atypical squamous cells identified on pap smear on 08/31/2021. She reports one earlier pap smear in 07/2021 which was also HPV screen positive, though she reports no other history of abnormal pap smears.     She has a history of gastric sleeve surgery in 2021, otherwise no additional history of organs removed  She reports a history of smoking cigarettes beginning at age 53 or 79. She quit on 12/30/2019, and reports that she would smoke about 1 pack per day during this period.      Family History:    Family history was reviewed today with a detailed three-generation pedigree. Reported family history of cancer is summarized below. Medical records for family members were not available for review unless otherwise noted.     Family History       Relation Problem Comments    Mother - Mom Associate Professor) Drug abuse crack cocaine   Mental health problem unknown type   Ovarian cysts        Father - Dad (  Deceased) Cancer - colon (Age: 61) d.58, liver failure, worked on planes for the [employer]   Colon cancer    Drug abuse crack cocaine   Heart disease    High blood pressure        Brother - Brother Alcoholism    Anxiety disorder    Asthma    Depression    Mental health problem possible bipolar disorder   Suicidality attempted suicide       Paternal Aunt - Dana Early death    Obesity        Paternal  Uncle Colon cancer (Age: 73)        Maternal Grandmother - Jo ann (Deceased) Alcoholism    Cancer    Cancer - lung in 50s-60s, had pneumonia   Cervical cancer unknown age   Mental health problem unknown type       Paternal Grandfather - Oscar Colon cancer (Age: 36) [place] Eclampsia    Miscarriages / [place]        Cousin (Deceased) Cervical cancer from HPV       Maternal Great-Grandmother (Alive) Cervical cancer                   Maternal ancestry: Spanish, [place], [place]  Paternal ancestry: African/African American, [place]   Maternal Ashkenazi Jewish ancestry  No known consanguinity      Genetics and Cancer:  The majority of cancers happen by chance, often as part of the natural aging process.  Some cancers can be related to environmental exposures or behaviors (such as smoking).  A small portion of cancers are hereditary, meaning they are caused by a genetic risk factor passed down in a family from parent to child. Each person inherits thousands of genes from their mother and father. Some of these genes work to protect the body from developing cancer. If one of these protective genes is not working - described as a pathogenic variant or a mutation - this can increase the risk of developing certain types of cancer.       Some signs that there might be a genetic risk factor for cancer running in the family include:  cancer at young ages  rare types of cancer  more than one type of cancer in the same person  multiple people with cancer in a family, especially if they are the same kind of cancer        Genetic Risk Assessment:     The major feature(s) in Kayla Frey's family that is suggestive of a hereditary cancer predisposition is (are): her multiple paternal relatives all diagnosed with colon cancer.   Kayla Frey family history of colon cancer could be caused by a mutation in the MLH1, MSH2, MSH6, PMS2 or EPCAM genes or a mutation in one of several other cancer  susceptibility genes such as MUTYH, CHEK2, PTEN, POLE, POLD or APC.   An important paper by Tommi Rumps al. shows universal tumor screening for mismatch repair deficiency alone is insufficient for identifying a large proportion of colorectal cancer (CRC) patients with hereditary syndromes, including some with Lynch syndrome. At a minimum, 7.1% of individuals with CRC have a germline pathogenic variant (including the Lynch syndrome genes, MUTYH, APC, ATM, CHEK2, BRCA2, BRIP1, PALB2, BRCA1, CDKN2A, NBN, NTHL1, BMPR1A, GALNT12, FANCM, POT1, RAD51D, RPS20, and SMAD4) and pan-cancer multi-gene panel testing should be considered for all patients with CRC. Reference: DOI: 10.1200/PO.[facility] no. 5 (2021)  779-791. Published online Dec 28, 2019.      Genetic Testing / Medical Necessity:   Genetic testing is important for determining cancer screening, prevention, and treatment. A positive genetic test result is associated with specific guidelines for personalized cancer screening, prevention, and treatment.      Kayla Frey meets the [place] (NCCN) Lynch syndrome criteria for genetic testing based on her three paternal first- and second-degree relatives diagnosed with colon cancer at any age.     Informed Consent:    The risks, benefits, and limitations of genetic testing including the possibility of incidental findings and inconclusive results were reviewed. Kayla Frey expressed understanding.  We also explored the potential emotional and psychosocial implications of being at risk for hereditary cancer and going through the genetic testing process.       Test Selection:    We discussed different genetic testing options (smaller disease-focused panels vs. large pan-cancer panels). It is reasonable to consider testing with a multi-gene panel based on the fact that there are numerous genes associated with hereditary cancer susceptibility. Although specific genes may be more  strongly associated with certain types of cancers than others, the field of clinical cancer genetics is rapidly growing and we are constantly learning about new disease associations. Genetic testing with a multi-gene panel is supported by the NCCN 2023 Guidelines. Kayla Frey ultimately chose to move forward with genetic testing.        Possible Types of Genetic Test Results for Each Gene Tested:     Positive - If a pathogenic variant (mutation) is found, the patient may have an increased risk to develop cancer in the future. Personalized screening recommendations based on the specific pathogenic variant and personal/family history will be provided. A positive result may also influence cancer treatment and management decisions. The identification of a pathogenic variant can have implications for family members and genetic testing for relatives could help determine their cancer risks, and allow for modifications to their screening plan with the goal of cancer prevention and/or early detection.       Negative - If no pathogenic variant (mutation) is found, this would significantly decrease the likelihood of the patient having an inherited predisposition to cancer associated with the gene(s) tested. The possibility of a missed pathogenic variant, or a causative pathogenic variant in another gene not included in the test cannot be eliminated. Other informative family members may be encouraged to seek genetic counseling and consider testing.      Variant of Unknown Significance (VUS) - This is when a variation in a gene is noted and there is not enough information to determine whether this change increase the chances of developing cancer. The majority of VUS's will be reclassified as benign, meaning that they are likely to represent normal human variation and do not increase cancer risk. VUS results are common and should not be used to make health decisions.          Thank you for allowing me to participate in [facility] care.  Please feel free to contact me with any questions or new information.      Sincerely,      Gilford Raid, MS, [facility] (she/her)  Set designer and Prevention Program  Phone: [phone]  Fax: [phone]      C: Seraf[place] [address]  Kayla Frey (via [place] MyChart)      Time Spent Counseling  Patient: Start Time: 1:00pm End Time: 1:55pm   _________________________________________________________________    GUIDELINES FOLLOWED: Genetic counseling was provided according to the guidelines and standards outlined by the American Society of Clinical Oncologists (ASCO), [employer] (NSGC), [employer] (NCCN) and the Commission on Cancer [employer]), where applicable.     GENETIC DISCRIMINATION: The federal Genetic Information Nondiscrimination Act (GINA) was signed into law in 2008. This law restricts the use of genetic information in health insurance coverage and employment decisions. Certain restrictions apply depending on insurance and employer. Please [email or web address] more information.?     FUTURE CORRESPONDENCE: Recommendations, family history analysis, and risk assessment are based on information available at this time.? Our knowledge of genetics and hereditary cancer predisposition syndromes is constantly changing and evolving.? New genes and associations continue to be discovered and characterized.? If there are any changes to the personal or family history, such as new diagnoses of tumors/cancer or new genetic test results, patients should inform their healthcare providers and re-contact Cancer Genetics as this may change the assessment and recommendations outlined above.?

## 2022-02-26 ENCOUNTER — Telehealth
Admit: 2022-02-26 | Payer: MEDICAID | Attending: Student in an Organized Health Care Education/Training Program | Primary: Student in an Organized Health Care Education/Training Program

## 2022-02-26 MED ORDER — ESCITALOPRAM 20 MG TABLET
20 | ORAL_TABLET | Freq: Every morning | ORAL | 3 refills | 90.00000 days | Status: DC
Start: 2022-02-26 — End: 2022-08-08

## 2022-02-26 MED ORDER — PROPRANOLOL 10 MG TABLET
10 | ORAL_TABLET | Freq: Every day | ORAL | 0 refills | 30.00000 days | Status: DC | PRN
Start: 2022-02-26 — End: 2022-03-11

## 2022-02-26 NOTE — Patient Instructions (Signed)
Dear Kayla Frey,     It was good to see you in clinic today. This document is your After Visit Summary, which includes what we discussed in clinic today, what the next steps are, and a list of your medications.     Things you should do after you leave clinic today:    I sent in a refill of the lexapro to your preferred pharmacy    For the moments of higher intensity anxiety (more physical) please try the following    - Breathing exercises (below)    Breathing Exercises - Rebalancing the Nervous System - the 4-7-8 breath.    The central recommendation is a breathing exercise that you can easily learn.  I call it the four-seven-eight breath.  After breathing out, more fully than usual, take a quick but complete in breath to the count of four.  Then pause the breath, gently, to the count of seven.  Follow that with another very full exhalation to the count of eight, with the last three or four counts being a quite active exhalation, really pushing out as much air as is comfortable.   Do this for four breaths, at least morning and evening.       Be aware of how you are breathing as well.  The exercises (and indeed all of our breathing) should all be done with diaphragmatic breathing, moving your abdomen in and out with each breath.  Our society has taught many of [place] to routinely breathe more with our chests than our abdomens, but the opposite is healthier in many ways, and is more effective for these breathing exercises.      This and other breath related techniques were first developed in [place], 2500 years ago, and are collectively referred to as Pranayama.  There are hundreds of different breathing exercises within Pranayama, but only two that I commonly recommend.      This breathing exercise is chosen for its ability to alter the autonomic nervous system, the part that controls the automatic functions.  The change it makes is in enhancing the calming, parasympathetic tone, and reducing the frantic or activating sympathetic  tone.  You need both, but the balance is whats important. By practicing this just twice daily you will gradually see some change in your own inner balance over a few weeks, and over a longer while you will begin to notice a change in your baseline stress level even when you arent thinking about it.  By the time theyve done this regularly for six weeks, most people find it very helpful.    - Relaxation, guided imagery and mindfulness techniques (attached)    - You can also try the propranolol 10mg  once daily as needed (either taking 20-60 minutes before the anxiety-producing event) or as needed when you have having physical symptoms of anxiety (heart racing, sweaty palms, etc)- Let me know how this medication works for you!      For your varicose veins, please see attachment for information on how to manage and below for instructions on compression stocking use    The easiest and fastest way to get compression stockings is by purchasing them directly at your local pharmacy or ordering online (via [facility] for example). Our clinic can try to order them through your insurance but this is often a much slower process and not always cheaper. Please let [facility] know if prefer to try ordering them through insurance. If purchasing them for yourself, I recommend trying two sizes. You want  to make sure the stockings feel snug but not too tight where they are cutting off circulation. The higher stockings the better, however most patients find that the knee-high or slightly above the knee stockings are the most comfortable and easiest to use.    Please see additional instructions below for compression stocking instructions.     Compression stockings  Please apply in the morning time (when swelling is usually at its least severe) and wear until bedtime. Okay to remove when bathing.  Amount of pressure: Stockings have different levels of tightness (compression). The amount of compression in stockings is described using millimeters of  mercury (mmHg), which is a standard measurement of pressure.    Mild compression (light support) = less than 20 mmHg: Stockings up to 20 mmHg are typically sold online or in stores and are often used to relieve minor swelling or discomfort from standing for extended periods of time.  Mild-moderate compression = 20-30 mmHg: These stockings are the most commonly prescribed and are often used to control swelling and pain following DVT or in people with varicose veins.  Moderate compression = 30-40 mmHg: These stockings are typically used in people with more severe symptoms that are not controlled by stockings in the 20-30 mmHg range.  Firm compression = 40-50 mmHg: These stockings are used in people with severe PTS or other vein problems, such as those with a history of venous ulcers.    TIPS FOR EASY USE:  Hand wash new stockings with a mild soap to make the material a bit more flexible.  If affordable, consider buying more than one pair of stockings. That way, youll have an extra pair on hand if the other pair becomes dirty or damaged.  Put your stockings on when you first wake up. (Youll typically have less swelling in your legs in the morning.) It helps to put stockings on with your back supported by a chair.  Turn most of the stocking inside out before pulling it up your leg. Then put your toes into the toecap of the stocking and gently roll the fabric up your leg.  Wearing rubber gloves may help you get a better grip on the fabric.    Compression stockings may cause some discomfort at first, but any soreness or aching should subside quickly if the stockings fit correctly. However, you should call your doctor immediately if you experience any numbness, significant pain, or discoloration in your legs or feet.    Compression stockings should be replaced every 5-6 months. If wearing a prescription pair, your insurance provider may cover some of the cost.        Please don't hesitate to reach out with any questions  or concerns. MyChart is a good way to reach me or a colleague for non-urgent questions (usually a turn-around time of 3 days). For more urgent matters, it's best to call the clinic at [phone]. There is always a nurse on-call to triage questions during regular business hours and schedule same-day appointments, and there is a physician on call 24/7.      Please let me know if you have any questions or concerns going forward.     Best,    Park Liter, MD
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
## 2022-02-28 ENCOUNTER — Telehealth
Admit: 2022-02-28 | Payer: MEDICAID | Attending: Physician | Primary: Student in an Organized Health Care Education/Training Program

## 2022-02-28 DIAGNOSIS — N926 Irregular menstruation, unspecified: Secondary | ICD-10-CM

## 2022-02-28 MED ORDER — LORAZEPAM 1 MG TABLET
1 | ORAL_TABLET | Freq: Once | ORAL | 0 refills | 10.00000 days | Status: DC
Start: 2022-02-28 — End: 2022-02-28

## 2022-02-28 NOTE — Progress Notes (Signed)
CC:  36 y.o. presents for menstrual problems, TTC    S:  At our last visit, which was for LEEP, Kayla Frey shared she was actively TTC.   She explained she'd had lifelong oligomenorrhea. Menarche age 36. Then in teens & 20s would go months without menses; and when came could be very long & heavy, requiring treatment with oral progestins so stop bleeding.   However, since her bariatric surgery in 2021 things have improved. This past year for the first time, has had onset of regular menses Qmo, not heavy or long.      She saw Dr. Lonna Frey for a preconception visit, they discussed her bariatric surgery & CHTN as it pertains to pregnancy.  Also heard counseling about risk of SAB post LEEP.  They are still TTC but "taking a step back."  Also saw genetic counselors, expanded carrier testing ordered--pending    Today she reports healing has been smooth since her LEEP    Still charting cycles, having a period every month  Mar 14  Apr 14  May 20-26  Jun 30  Duration 7 days usually  Unusually, this last cycle had postcoital spotting that persisted off & on for 4 days (very light), then cycle finally came, lasted 5 days, felt normal.  No spotting now    Weight has been overall pretty steady, "I think I'm at a plateau."        Her partner fathered 1 pregnant 20 years ago  No SA    She has a h/o chlamydia in 07/2021, treated.  Recent testing negative (Feb & April of this year)  No h/o pelvic or abdomina surgeries.    Objective:     EXAMINATION:  General: Well appearing, NAD    Studies:  Hemoglobin A1c (PERCENT)   Date Value   07/26/2021 4.5       Impression/Plan:    Kayla Frey is a 36 y.o. G0P0000 with   1.  Irregular menses  Chronic, stable  Likely PCOS variant.  Have improved since weight loss following bariatric surgery, now cycling every 30-40 days  Desires pregnancy--monitoring for ovulation and TIC discussed today.  Continue healthy diet & exercise    2.  Preconception  --appreciate GC, awaiting results  --h/o bariatric surgery:  nutrition consult placed    3.  Primary infertility  New, undiagnosed  Some ovulatory dysfunciton as above--if normal SA and no tubal factor, very reasonable to trial letrozole.  Handout shared today & discussed in brief.  I recommend holding off on treatment until 35mo s/p LEEP (where risk of SAB appears to be highest).  I also recommend   --tubal evaluation given h/o chlamydia--saline sono ordered.  Prescription lorazepam 2mg .  --SA given female factor untested in 20 years    4. S/p LEEP  Needs follow up HPV testing in October--will schedule      I spent a total of 39 minutes on this patient's care on the day of their visit excluding time spent related to any billed procedures. This time includes time spent with the patient as well as time spent documenting in the medical record, reviewing patient's records and tests, obtaining history, placing orders, communicating with other healthcare professionals, counseling the patient, family, or caregiver, and/or care coordination for the diagnoses above.    Notes reviewed: 3 (PCP, MFM, GC)    Follow up:    October    November, MD    I performed this evaluation using real-time telehealth tools, including a  live video Zoom connection between my location and the patient's location. Prior to initiating, the patient consented to perform this evaluation using telehealth tools.

## 2022-02-28 NOTE — Patient Instructions (Addendum)
To schedule your saline sono to check your fallopian tubes:  Call the radiology department at [phone].  Your goal is to schedule the saline sono on day 7-10 of your cycle (Day 1 = the first day of bleeding during your period).  The hope is that this will be soon after your period finishes.  If you are having spotting the day of the procedure, it may need to be rescheduled.    How should I prepare for my saline sono?    No unprotected once your period starts  Take both ibuprofen (motrin, advil) 830m and tylenol 10034m1-2 hours before your appointment time. This will help prevent cramping & pain  Take lorazepam 75m37m hour before your appointment.  This will help with anxiety.  DO NOT DRIVE.    How is a saline sono done?  A woman is positioned on a gynecology bed.  The gynecologist or radiologist then examines her uterus and places a speculum in her vagina. Her cervix is cleaned, and a device (cannula) is placed into the opening of the cervix. The doctor then gently fills the uterus with a saline (salt water) through the cannula. Then an ultrasound is performed to see if there is any scar tissue within the uteruine cavity    Is it uncomfortable?  A saline sono usually causes mild or moderate uterine cramping for about five to ten minutes; however, some women may experience cramps for several hours. The symptoms can be greatly reduced by taking medications used for menstrual cramps. Women should be prepared to have a family member or friend drive them home after the procedure.            Semen Analysis--I'd recommend doing this in September if you're not pregnant by then    An order has been placed for a semen analysis - [facility] may [facility] an out of pocket fee for the service - some insurance plans cover some or all of the cost. We recommend you contact your insurance provider directly to inquire.    For PPO insurance plans, please call [phone] schedule your appointment and drop off. Instructions for collection are below.    For [facility] plans, you may require authorization from your primary care provider. You can provide your provider/insurance company with the following codes for the request:     Basic Semen Analysis: CPT [id number]     Semen Analysis with Strict Morphology: CPT 893662 516 2011 Instructions for Semen Collection    You may produce your specimen in one of our collection rooms or bring your specimen from home. To schedule your appointment, please [phone] 1 If no one answers, please leave a message stating you are calling to schedule a semen analysis. You will be assigned an appointment time for dropping off your specimen or using the collection room and you will be asked to provide picture identification before the laboratory staff will accept your specimen    No one other than the female providing the specimen can drop off a specimen for insemination.  THERE IS NO EXCEPTION TO THIS POLICY.    Important steps to follow:     Please refrain from ejaculating 2 to 3 days prior to anticipated insemination date, but no longer than 5 days. Longer abstinence may impair semen quality.     If bringing your specimen from home, specimen must be received  within 1 hour of collection. While transporting a specimen from home, be sure to keep the specimen container warm against your body. Your specimen must be in a sterile specimen cup which must be obtained from our office. The label on the specimen container MUST be filled out completely with both partners names. AN UNLABELED SPECIMEN CONTAINER MUST BE DISCARDED.     If any portion of the sample spills, please inform the laboratory of this fact, so they can note it in their test interpretation.     Specimens should be collected by self-stimulation. Avoid the use of lubricants if possible. Lubricants and some other substances can contaminate the specimen and/or compromise  sperm quality. If it is necessary, use mineral oil provided by the clinic.     DO NOT collect specimen through intercourse as this may cause bacterial contamination. Special collection condoms can be provided upon request. DO NOT use regular condoms as they may contain spermicide which can kill the sperm.     Avoid hot tub use and/or recreational drugs before providing a specimen for insemination. (Sperm usually has a 70-90 day production cycle before maturing. Even if you participated in these activities with in the past 3 months, avoiding them with in a short period of time before insemination may be helpful.)     BRING PICTURE IDENTIFICATION. Patients will be asked the date, time of collection and # of days of abstinence.    On the Day of your appointment:     Go to [address] 6th floor at your scheduled appointment time.     Check in with the receptionist.     On the 6th floor our lab personnel will check your identification and take your specimen or take you to the collection room. You will not be able to produce/drop off without a photo ID.    For IUI patients, female partner will come back later at her scheduled time for IUI.    -----------------      Timing for Fertility    You are most fertile in the few days leading up to ovulation.  Most women ovulate 10-14 days before their next period.  So, for example,   if you have a 30 day cycle, you likely ovulate somewhere between Day 16- Day 20, and are most fertile from Day 12-20  if you have a 40 day cycle, you likely ovulate somewhere between Day 33- Day 30, and are most fertile from Day 22-30    For most women with a monthly cycle, aiming to have sex every other day from Day 10-20 will be a successful strategy  Every other day seems just as good as every day.  It's best NOT to abstain all month long & then wait for ovulation--sperm quality may decline with this approach, and if you're wrong about when ovulation occurs, you'll miss your window  altogether!   If you are struggling to conceive, you might avoid using lubricants, or perhaps use Pre-Seed lubricant.  Regular lubricant might inhibit sperm motility (ability to move well)    If you want additional help, you could consider  --reading the book Taking Charge of Your Fertility, which teaches you how to chart your cycles in detail & estimate when you are most fertile  --using the Natural Cycles app, which guides you through charting your cycles in an app-based format  --using an ovulation predictor kit to help you figure out when you are ovulating (Clear Blue Easy Digital is easy to understand,  but expensive!)    ------------------------------  Letrozole for Help Getting Pregnant    Letrozole is an aromatase-inhibitor that is commonly used to help treat women who have breast cancer.  However, a number of large studies have also shown its usefulness in helping women with PCOS get pregnant.  This is an "off label" use, as letrozole has not been approved by the FDA for help in getting pregnant.    There is a risk of multiples (twins or more) with letrozole.  Twins have been reported in ~3% of cases.  This appears to be about half as likely that when clomiphene citrate (clomid) is used.  Other side effects include fatigue (~20%) and lightheadedness (~10%).    Below are instructions for its use as a fertility treatment.      Remember, Day 1 is the first day of your period (convincing period flow, not just slight spotting)    1. Take letrozole 2.56m on days 3-7 of your menstrual cycle.  In other words, you'll take one tablet a day for a total of 5 days.  Day 1 is the first day of your period.    2.  Have sex every other day on day 10-20 of your cycle.   Ovulation usually occurs 5-12 days after the last day of letrozole administration     3.  Check for signs of ovulation.  Begin checking for ovulation using an ovulation detector* on day 10 of your cycle.  Ovulation usually occurs 14-26 hours after a positive  test, but can be up to 48 hours.  Once the test is positive, you do not need to continue to check the ovulation test.  If you DON'T detect ovulation, then return to the lab for a progesterone level on day 21-23 of your cycle.  (You can skip this step if your ovulation test turned positive).    4.  Plan your next cycle if needed.  If you don't get pregnant, but you did ovulate on the first cycle, you may continue with the same dosage of letrozole.    If it seems like you did NOT ovulate, you may need to increase the dose of letrozole.  Email me through [facility] I can change your prescription if needed.    5.  What to do if your period doesn't come.  Check a pregnancy test if your period is late.    If you do not get a period after letrozole, and your pregnancy test is negative, you will need to take provera to start your period.  After a negative pregnancy test, take provera 144monce a day for 7 days in a row.  You should have a period within 2 weeks of stopping the provera . If you don't get a period, repeat the pregnancy test and call the office.    6.  Keep track of your cycles and stay in touch.  Once you are ovulating well and timing intercourse to coincide with ovulation, continue for 3-6 months.  If you are not pregnant at the end of 3-6 cycles, we will reevaluate your history and take the next step in your infertility evaluation.  Good luck!  Call us[place]oreaith any questions: [phone]    *I recommend using Clear Blue Easy Digital ovulation predictor kit, as it is simpler to tell a positive from a negative result.  However, any over the counter kit that you feel comfortable using is just fine.

## 2022-03-11 MED ORDER — PROPRANOLOL 10 MG TABLET
10 | ORAL_TABLET | Freq: Every day | ORAL | 0 refills | 30.00000 days | Status: DC | PRN
Start: 2022-03-11 — End: 2022-05-16

## 2022-03-11 NOTE — Telephone Encounter (Signed)
Pt stating that her & Dr. Louanne Skye spoke about propranolol 02/26/22. She has been using the prescription for anxiety and would like to continue a prescription for regular daily dosage.   Current pended order:  propranoloL (INDERAL) 10 mg tablet  Take 1 tablet (10 mg total) by mouth daily as needed (for anxiety) for up to 90 days    Last time approved:  propranoloL (INDERAL) 10 mg tablet  Take 1 tablet (10 mg total) by mouth daily as needed (for anxiety) for up to 10 days  Dispense: 10 tablet Refill: 0  [facility] 1545 2 by Kary Kos ALCALAY on 02/26/22        Last office visit:    09/10/2021   Last video visit:     02/26/2022   Last scheduled telephone encounter:  Visit date not found    Next appointment:  Visit date not found    Last  BP:  BP Readings from Last 1 Encounters:   11/28/21 133/75       Last Labs:    Lab Results   Component Value Date    Creatinine 0.87 07/26/2021    Sodium, Serum / Plasma 139 07/26/2021    Potassium, Serum / Plasma 4.1 07/26/2021    Hemoglobin A1c 4.5 07/26/2021    Cholesterol, Total 199 07/26/2021    LDL Cholesterol 133 (H) 07/26/2021    Cholesterol, HDL 53 07/26/2021    Thyroid Stimulating Hormone 1.15 01/02/2020    Hemoglobin 11.7 (L) 11/28/2021    eGFR - low estimate 90 10/24/2020    eGFR - high estimate 104 10/24/2020       If patient has not been seen in clinic for 12 months or more I have routed encounter to the administrative team to book a follow-up appointment.

## 2022-03-13 ENCOUNTER — Other Ambulatory Visit
Admit: 2022-03-27 | Payer: MEDICAID | Attending: Registered Dietitian | Primary: Student in an Organized Health Care Education/Training Program

## 2022-03-13 DIAGNOSIS — Z008 Encounter for other general examination: Secondary | ICD-10-CM

## 2022-03-13 NOTE — Progress Notes (Incomplete)
I performed this evaluation using real-time telehealth tools, including a live video Zoom connection between my location and the patient's location. Prior to initiating, the patient consented to perform this evaluation using telehealth tools.  DOS: 03/13/22     NUTRITION SERVICES ASSESSMENT    Patient History:  Kayla Frey is a 36 y.o. female G0P0000 currently at [facility] with a history of ***, referred for nutritional consult. Past medical history of ***.   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  Time frame for weight change:    Expected weight gain: *** per [facility] of [facility] guidelines    Assessment of weight change:  .    Plateaud for a while between 295-320 lb; ***   Yesterday was 303 lb. Before surgery 526 lb before surgery, lost 40 lb on liquid diet before surgery so started at ~465. Target weight 245-250 lb.    Desires to have children, not having cyles due to PCOS and wieght issues  Actively trying to conceive, having regular periods every month, tracking cycles      Nutrition-focused physical findings:     GI History: constipation, no concerns  GI Findings and Assessment:         Food and Nutrition Intake:  Information obtained from:      Oral Food and Fluid Intake          60 min visit  Wakes up 6:30a  B (7a)- coffee w/ premier protein shakes w/ caramel machiato, sweet n lo, mostly eggs, [place] bacon or sausage  (2 strips); almonds  [ ]  adding a fruit    Lunch- mostly grazing over a period of a couple of hours not always hungry; eats a fruit, beef jerky, cheese itz, Greek yogurt w/ granola  [ ]  coffee same as above w/ lunch as does not like meal by itself    Snk- fruits    D- baked stuffed chx breast w/ onions, bell peppers, broccoli w/ cheese; has left-over 30-45 min later if still hungry    Snk- low sodium, 1/4 bag popcorn  Bev- izzy, apple juice, water w/ crystal light (500 mL)  Bedtime 9-10p    Plan  [ ]  calcium  [ ]  coffee decrease  [ ]  water between meals, diluted w/ juice if needed    Vitamins  - calcium 600 mg, MVI, biotin  - [employer] MVI Patch once/day  - Vitamin D3/K2 (12000 mcg)  [ ]  folic acid addl  [ ]  bariatric choice   [ ]  calcium  [ ]  expected weigth goals in pregnancy  ***75 min visit      No dehydration ***  Healthier relationship with food, with her body  Blood Glucose Monitoring:  ***Pt received her blood glucose monitor and supplies. Instructed pt on use of  meter.   BG was *** mg/dl during our consult training. Discussed glucose and food record keeping.     Food Access        Behavior        Knowledge, Beliefs, Attitudes        Physical Activity and Function    ***Works out 3x/week, 30-45 min cardio or swimming pool to not overwork; very active at home  [ ]  strength, core workout    Assessment of Dietary Intake:  {Average Intake - OP Adults (Optional):47012}    Assessment of energy/protein intake:                  Significant Labs:  Biochemical:  Lab Results   Component Value Date    Sodium, Serum / Plasma 139 07/26/2021    Potassium, Serum / Plasma 4.1 07/26/2021    Chloride, Serum / Plasma 104 07/26/2021    Carbon Dioxide, Total 25 07/26/2021    Urea Nitrogen, Serum / Plasma 16 07/26/2021    Creatinine 0.87 07/26/2021    eGFRcr 90 07/26/2021    Glucose, non-fasting 85 07/26/2021    Calcium, total, Serum / Plasma 9.1 07/26/2021    Magnesium, Serum / Plasma 2.2 06/04/2020    Phosphorus, Serum /  Plasma 4.1 06/04/2020     Lab Results   Component Value Date    Alanine transaminase 9 (L) 07/26/2021    AST 13 07/26/2021    Alkaline Phosphatase 79 07/26/2021    Bilirubin, Total 0.3 07/26/2021    Triglycerides, serum 63 07/26/2021    PT 13.0 01/02/2020     No results found for: LACTWB  Lab Results   Component Value Date    Hemoglobin 11.7 (L) 11/28/2021    MCV 94 11/28/2021    Abs Neutrophils 5.41 11/28/2021     Lab Results   Component Value Date    Hemoglobin A1c 4.5 07/26/2021     ***  Micronutrient Profile:  Lab Results   Component Value Date    Vitamin D, 25-Hydroxy 21 07/26/2021    Ferritin, Serum/Plasma 131 11/28/2021    Iron, serum 82 11/28/2021    Transferrin, Serum/Plasma 191 11/28/2021    % Saturation 31 11/28/2021    Vitamin B12 532 07/26/2021    Folate, serum 6.4 07/26/2021    Thiamine Pyrophosphate 94 07/26/2021    Zinc, plasma 62 10/24/2020    Copper, serum/plasma 128 07/26/2021       Inflammatory profile:   Lab Results   Component Value Date    Albumin, Serum / Plasma 3.8 07/26/2021    WBC Count 9.0 11/28/2021       No results found for: GLT1    No results found for: GLFP, 3GT60, 3GT120, 3GT180    Lab Results   Component Value Date    WBC Count 9.0 11/28/2021    Hemoglobin 11.7 (L) 11/28/2021    Hematocrit 35.7 (L) 11/28/2021    MCV 94 11/28/2021    Platelet Count 283 11/28/2021       Significant Meds:  Current Outpatient Medications   Medication Instructions    biotin 10,000 mcg TABRAPID 1 tablet, Oral    blood pressure monitor KIT Monitor blood pressure weekly in setting of preparing for pregnancy    calcium carbonate (CALCIUM 600 ORAL) 600 mg, Oral, Pt reports to be consuming 2 tablets/ day for a total of 1200 mg    clotrimazole (LOTRIMIN) 1 % cream Topical, Twice Daily, Use as instructed    diclofenac (VOLTAREN)  1 % gel Topical, 4 Times Daily Scheduled    EPINEPHrine (EPIPEN) 0.3 mg, Intramuscular, Once PRN, Use as instructed    escitalopram oxalate (LEXAPRO) 30 mg, Oral, Every Morning  Scheduled, .crush tablet before taking for 3 months.    multivitamin-min-iron-FA-vit K (PROCARE BARIATRIC) 45 mg iron- 800 mcg-120 mcg capsule 1 capsule, Oral, Daily At Bedtime Scheduled    nystatin (MYCOSTATIN) powder Topical, 2 Times Daily Scheduled, To abdomen    propranoloL (INDERAL) 10 mg, Oral, Daily PRN    ulipristaL (ELLA) 30 mg tablet Take 1 tablet(30 mg)by mouth as soon as possible within 72 hours after unprotected intercourse or known or suspected contraceptive failure.<BR>    valACYclovir (VALTREX) 500 mg, Oral, Daily Scheduled, For suppression.     Nutrition diagnosis:        related to   as evidenced by  .  .                     Malnutrition Summary:      related to   as evidenced by   .            ***Limited food and nutrition related knowledge related to lack of comprehensive education regarding *** as evidenced by identification of new actions patient is able and agreeable to implement.    Nutrition Intervention:  Oral Nutrition:                   Oral Nutrition Supplements and/or Food Fortification       Micronutrient Recommendations and Monitoring:                    Nutrition Related Medication Recommendations:         Nutrition Education:        Nutrition, diet, and lifestyle management of blood glucose in pregnancy:  Have small, frequent meals throughout the day rather than large, infrequent ones to keep your glucose levels stable and prevent spikes.  Choose complex, unrefined carbohydrates which are higher in fiber, including whole-grains, legumes, and vegetables.  Combine meals and snacks with a serving of protein, for example by pairing fruits with nuts/nut butter or cheese.  Choose more unsaturated fats including fish, nuts, nut butter, olives, avocado, seeds, and plant-based oils.   Common breakfast foods like cereals, milk, yogurt, and fruit may cause a rapid spike in glucose during the normal period of increased insulin resistance.   Tips to help maintain blood glucose more controlled  during the night:  Limit the overnight fasting period to under 10 hours. Include a bedtime snack rich in fiber, protein or fats to keep your glucose more steady throughout the night. It is best to avoid fruit, milk, and yogurt for the bedtime snack.  Modest carbohydrate intake at dinner may help improve glucose throughout the night. Have a meal with lean protein, vegetables, and a modest portion of whole-grains.  Adding a 15-20 minute walk after dinner may be helpful to lower fasting glucose.  Physical activity can help lower your glucose levels. Aim for 30 minutes of moderate activity (e.g. walking), or 10-minute rounds 2-3 times per day    Suggested carbohydrate distribution during pregnancy:  Breakfast 15 to 30 grams  Lunch  45 to 60 grams  Dinner  45 to 60 grams  Snacks 15 to 30 grams    Nutrition Counseling:      ***    Coordination of Care:      ***  Nutrition Related Monitoring Requests for Team:      ***    Educational materials provided:   [x]  Blood Glucose Log  [x]  Food sources of carbohydrates  [x]  My Plate Method - ADA  [x]  Managing Blood Glucose in Pregnancy  [x]  List of snack ideas  [x]  ***Defining Carbohydrates with Food Label  [x]  ***Carbohydrate counting list  [x]  Healthy ways of adding calories and protein     Nutrition Monitoring/Evaluation:       - Balanced diet and nutrient intake to help maintain blood glucose within pregnancy targets (FBG 70-94 mg/dL; PPBG (1-hour) 366-440 mg/dL).  - Submit weekly food and glucose log to assess dietary intake and blood glucose control    Kayla Frey MAS RD CSP IBCLC  Clinical Registered Dietitian  [facility]  Phone: [phone]  E-mail: [email or web address]    ***  Kayla Frey Christiana Pellant MAS RD CSP IBCLC  Clinical Registered Dietitian  [facility]  Phone: [phone]  E-mail: [email or web address]

## 2022-03-13 NOTE — Progress Notes (Signed)
I performed this evaluation using real-time telehealth tools, including a live video Zoom connection between my location and the patient's location. Prior to initiating, the patient consented to perform this evaluation using telehealth tools.    DOS: 03/13/22     NUTRITION SERVICES ASSESSMENT    Patient History:  Kayla Frey is a 36 y.o. 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    Anthropometric Data and Assessment:  Ht Readings from Last 1 Encounters:   03/12/22 172.7 cm (_0 )     Wt Readings from Last 3 Encounters:   03/12/22 (!) 137.4 kg (303 lb)   01/29/22 (!) 131.5 kg (290 lb)   10/29/21 (!) 136.1 kg (300 lb)     BMI Readings from Last 1 Encounters:   03/12/22 46.07 kg/m     Weight prior to surgery: 526 lb (375% IBW)  Post-surgery target weight: 245-250 lb  Usual body weight for the past months: 295-320 lb    Nutrition-focused physical findings:  GI History:  GI Findings and Assessment: struggles w/ mild constipation  GI: Denies GI complaints, Constipation    Food and Nutrition Intake:  Information obtained from: Patient    Oral Food and Fluid Intake  Baseline Modified Diet: None  Meal/Snack Pattern: 3 meals per day  Meal Intake Percentage: >75% of meals    Diet Recall: Typical day, Meal by meal recall  Breakfast: (7a)- coffee w/ premier protein shake (caramel machiatto), sweet n lo, eggs, [place] bacon or sausage (2 strips); almonds  Lunch: Grazing over a period of a couple of hours as not always hungry; eats fruit, beef jerky, cheese itz, Greek yogurt w/ granola + drinks another cup of coffee  Dinner: baked chx breast w/ onions, bell peppers, broccoli, cheese;  Snacks: occ fruits; (hs) left-overs from dinner if still hungry  Food Allergies/Intolerances: none  Vitamin/mineral and Herbal Supplements/CAM intake details: [facility] MVI, vit D3/K2, biotin, calcium 600 mg    Food  Radio broadcast assistant Availability:  (no concerns reported)     Knowledge, Beliefs, Attitudes  Food and Nutrition Knowledge: Basic nutritional knowledge  Readiness to Change Nutrition-Related Behavior Stage: Preparation, Action  Knowledge/Beliefs/Attitudes details: Pt endorses feeling very satisfied with her diet since her surgery. She is eating a variety of foods and ensures to include adequate sources of protein throughout the day. Pt desires to optimize her nutritional status as she is currently TTC.     Physical Activity and Function  Physical activity details: Exercising 3x/week, 30-45 min cardio or swimming; very active at home    Assessment of Dietary Intake:  Assessment of energy/protein intake:  (Overall, eating well balanced meals- opportunity to increase nutrient density w/ addition of more fruits and vegetables; will require more fluids (water) and less caffeine)  Micronutrient Intake: Adequate (Currently on vit/min patches- unsure of efficacy in pregnancy and w/ h/o bariatric surgery- rec'd transition to oral bariatric vitamins/minerals)    Significant Labs:  Lab Results   Component Value Date    Hemoglobin A1c 4.5 07/26/2021     Lab Results   Component Value Date    Vitamin D, 25-Hydroxy 21 07/26/2021    Ferritin, Serum/Plasma 131 11/28/2021    Iron, serum 82 11/28/2021    Transferrin, Serum/Plasma 191 11/28/2021    % Saturation 31 11/28/2021    Vitamin B12 532 07/26/2021    Folate, serum 6.4 07/26/2021    Thiamine Pyrophosphate  94 07/26/2021    Zinc, plasma 62 10/24/2020    Copper, serum/plasma 128 07/26/2021     Lab Results   Component Value Date    WBC Count 9.0 11/28/2021    Hemoglobin 11.7 (L) 11/28/2021    Hematocrit 35.7 (L) 11/28/2021    MCV 94 11/28/2021    Platelet Count 283 11/28/2021     Significant Meds:  Current Outpatient Medications   Medication Instructions    biotin 10,000 mcg TABRAPID 1 tablet, Oral    blood pressure monitor KIT Monitor blood pressure weekly in setting of preparing for  pregnancy    calcium carbonate (CALCIUM 600 ORAL) 600 mg, Oral, Pt reports to be consuming 2 tablets/ day for a total of 1200 mg    clotrimazole (LOTRIMIN) 1 % cream Topical, Twice Daily, Use as instructed    diclofenac (VOLTAREN) 1 % gel Topical, 4 Times Daily Scheduled    EPINEPHrine (EPIPEN) 0.3 mg, Intramuscular, Once PRN, Use as instructed    escitalopram oxalate (LEXAPRO) 30 mg, Oral, Every Morning Scheduled, .crush tablet before taking for 3 months.    multivitamin-min-iron-FA-vit K (PROCARE BARIATRIC) 45 mg iron- 800 mcg-120 mcg capsule 1 capsule, Oral, Daily At Bedtime Scheduled    nystatin (MYCOSTATIN) powder Topical, 2 Times Daily Scheduled, To abdomen    propranoloL (INDERAL) 10 mg, Oral, Daily PRN    ulipristaL (ELLA) 30 mg tablet Take 1 tablet(30 mg)by mouth as soon as possible within 72 hours after unprotected intercourse or known or suspected contraceptive failure.<BR>    valACYclovir (VALTREX) 500 mg, Oral, Daily Scheduled, For suppression.     Nutrition diagnosis:    Altered GI function   related to history of sleeve gastrectomy as evidenced by need for increased protein and micronutrients.. New.    Increased nutrient needs (specify) (protein, vitamins, minerals)     related to pregnancy planning, history of bariatric surgery (gastrectomy) as evidenced by need for added fruits/vegetables and adjustment of micronutrient supplementation  for more optimal intake in pregnancy.. New.          Nutrition Intervention:  Oral Nutrition:   Nutrition recommendations: General, healthful diet, Protein Modification, Fluid Modification, Other modification  Protein Modification: Increased protein intake  Fluid Modification: Increased fluid intake (Rec'd increasing fluid intake (e.g. water); can try adding sugar-free electrolyte powder for more optimal absorption (LMNT or Trace))  Other Modification: Reduce caffeine intake to 1 serving per day w/ breakfast (< 200 mg/day)  Modify schedule of food/fluids: Small,  frequent meals, Fluids between meals (Cont small, frequent meals ensuring adequate protein w/ each meal and snack)     Oral Nutrition Supplements and/or Food Fortification  Supplemental nourishment plan: Continue oral nutrition supplements  Supplement type: Oral nutrition supplement  Name and nutrient provision: Community education officer  Frequency recommendation: Other (comment) (1-2 x/day depending on food intake)    Micronutrient Recommendations and Monitoring:   Recommend vitamin supplement therapy, Recommend mineral supplement therapy  Specific vitamin recommendations: Multivitamin, Vitamin D, Folate (Rec'd switching from MVI/vitD patch as not well studied in pregnancy. Try oral supplementation w/ Bariatric Choice Once Daily MVI (label reviewed, contains 182 mcg folic acid and 9937 units vit D))  Specific mineral supplements therapy: Calcium (Up calcium intake to at least 1000 mg/day + sources of dairy from the diet)     Nutrition Education:   Nutrition Education Type : Education on nutrition's influence on health, Physical activity guidance (We discussed expected weight changes during pregnancy. Goal of maintaining TWG < 20 lb and  preventing weight loss during pregnancy. Rec'd adding more physical activity as able, especially light strength/core training.)    Nutrition Counseling:   Counseling Strategies: Motivational interviewing    Coordination of Care:   Coordination of Nutrition Care: Collaboration with other providers (Rec'd nutrition follow-up once pregnant)    Nutrition Monitoring/Evaluation:  Nutrition Goal Indicator: Macronutrient intake, Micronutrient intake, Weight  Nutrition Goal Criteria: Meet nutrient goals from a balanced diet, ensuring protein goals are met; maintaining healthy wt changes in pregnancy  Nutrition Goal Progress: New goal identified  Nutrition Goal 2 Indicator: Micronutrient intake, Food intake, Food and nutrition knowledge, Other (comment) (Micronutrient supplement)  Nutrition Goal  2 Criteria: Optimize vit/min regimen in prep for pregnancy  Nutrition Goal 2 Progress: New goal identified    Rhae Hammock MAS RD CSP IBCLC  Clinical Registered Dietitian   Obstetrics Services and Perinatal Medicine Specialties at [facility]  Phone: [phone]  E-mail: [email or web address]

## 2022-04-30 NOTE — Progress Notes (Deleted)
Female Pelvic Medicine and Reconstructive Surgery  Consult Note  Self referred, patient of Dr. Dellis Filbert and [facility]  For: urinary problem       Subjective    Kayla Frey is a 36 y.o. female who presents with the following:           History of Present Illness   ***    Urinary symptoms:   ***stress urinary incontinence.   ***urge urinary incontinence. ***urgency. ***frequency.   ***dysuria, ***hematuria. ***frequent urinary tract infections.  ***sensation of incomplete bladder emptying. ***postvoid dribbling.   *** Daytime voids  *** Nocturnal voids, ***nocturnal enuresis.   *** Pads per 24 hour period: ***    Prolapse symptoms: *** bulge symptoms.  *** protrusion of tissue     Bowel symptoms:  ***constipation. ***diarrhea. ***splinting. ***fecal incontinence. ***flatal incontinence.     I personally reviewed, interpreted, and summarized her outside hospital records below (including relevant lab/imaging results):  ***  Prior urogynecologic procedures/treatments:   ***  Ob Hx:  *** NSVD, *** C/S, ***  ***No prior history of obstetric anal sphincter injury    Gyn Hx:  She is postmenopausal and *** does/does not endorse postmenopausal bleeding.   ***Sexual active, and denies dyspareunia   ***Abnormal pap smears    Allergies/Contraindications   Allergen Reactions    Iodine Hives and Itching     IV Iodine.  Unsure about topical iodine    Shellfish Derived Anaphylaxis    Tree Nuts Anaphylaxis    Morphine Nausea And Vomiting     Outpatient Encounter Medications as of 04/30/2022   Medication Sig Dispense Refill    biotin 10,000 mcg TABRAPID Take 1 tablet by mouth      blood pressure monitor KIT Monitor blood pressure weekly in setting of preparing for pregnancy 1 kit 0    calcium carbonate (CALCIUM 600 ORAL) Take 600 mg by mouth Pt reports to be consuming 2 tablets/ day for a total of 1200 mg      clotrimazole (LOTRIMIN) 1 % cream Apply topically Twice a day Use as instructed 30 g 1    diclofenac (VOLTAREN) 1 % gel  Apply topically 4 (four) times daily 50 g 0    EPINEPHrine (EPIPEN) 0.3 mg/0.3 mL injection Inject 0.3 mL (0.3 mg total) into the muscle once as needed for Anaphylaxis for up to 1 dose Use as instructed 1 each 6    escitalopram oxalate (LEXAPRO) 20 mg tablet Take 1.5 tablets (30 mg total) by mouth every morning for 360 days .crush tablet before taking for 3 months. 135 tablet 3    multivitamin-min-iron-FA-vit K (PROCARE BARIATRIC) 45 mg iron- 800 mcg-120 mcg capsule Take 1 capsule by mouth nightly at bedtime 90 capsule 4    nystatin (MYCOSTATIN) powder Apply topically 2 (two) times daily To abdomen 120 g 5    propranoloL (INDERAL) 10 mg tablet Take 1 tablet (10 mg total) by mouth daily as needed (for anxiety) for up to 90 days 90 tablet 0    ulipristaL (ELLA) 30 mg tablet Take 1 tablet(30 mg)by mouth as soon as possible within 72 hours after unprotected intercourse or known or suspected contraceptive failure. 1 tablet 1    valACYclovir (VALTREX) 500 mg tablet Take 1 tablet (500 mg total) by mouth daily For suppression. 90 tablet 3     No facility-administered encounter medications on file as of 04/30/2022.     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 blood pressure Father Dad     Drug abuse Father Dad         crack cocaine    Colon cancer Father Dad     Heart  disease Father Dad     Depression Brother Brother     Anxiety disorder Brother Brother     Mental health problem Brother Brother         possible bipolar disorder    Alcoholism Brother Brother     Asthma Brother Brother     Suicidality Brother Brother         attempted suicide    Early death Paternal Aunt Hinton Dyer     Obesity Paternal April Holding     Colon cancer Paternal Uncle  59    Cancer - lung Maternal Grandmother Denice Paradise ann         in 50s-60s, had pneumonia    Alcoholism Maternal Grandmother Jo ann     Cancer Maternal Grandmother Denice Paradise ann     Mental health problem Maternal Grandmother Denice Paradise ann         unknown type    Cervical cancer Maternal Grandmother Pricilla Holm         unknown age    Colon cancer Paternal Grandfather Rica Mote 88        [employer] 19 [employer] [place] Cousin      Eclampsia Cousin      Cervical cancer Cousin          from HPV    Cervical cancer Maternal Great-Grandmother      Anesth problems Neg Hx         Social History     Tobacco Use    Smoking status: Former     Packs/day: 0.00     Years: 1.00     Pack years: 0.00     Types: Cigarettes     Start date: 09/14/2014     Quit date: 12/30/2019     Years since quitting: 2.3    Smokeless tobacco: Never    Tobacco comments:     taking chantix to help with smoking cessation   Substance and Sexual Activity    Alcohol use: Not Currently     Comment: has used socially in past; no history of problem use    Drug use: Never     Comment: no history of Rx med abuse or illicit substance use    Sexual activity: Yes     Partners:  Male     Comment: Pt denies pregnancy.  No activity for at least 1 mont       I reviewed the patient's pertinent allergies, medications {History Options (optional):33336::"past medical, social, and family history"} in the electronic health record and made updates as appropriate.    Review of Systems   Allergic/Immune: negative for: environmental or food allergies  Cardiovascular: negative for: chest pain  Respiratory: negative for: cough and  shortness of breath  Constitutional: negative for: decreased energy level, denies fevers or chills  Ears Nose Throat: negative for: ENT problems  Endocrine: negative for: unexpected weight loss or gain, temperature intolerance  Eyes: negative for: eye problems, ***dry eyes  Gastrointestinal: negative for: blood in stools  Hemat/Lymph: negative for: easy bruising, swollen lymph node(s)  Skin: negative for: rash  Musculoskeletal: ***for: myalgia  Neurological: negative for: weakness, *** memory problems  Psychiatric: negative for: depression, anxiety  Genitourinary: negative for: dysuria    All other systems were reviewed and are negative.          VALIDATED QUESTIONAIRES  PFDI20POPDI: ***/24  CRADI-8: ***/32   UDI-6: ***/24    MESA SUI: ***/27  UUI: ***/18  PGI-S  ***/4    Objective    There were no vitals taken for this visit.    PHYSICAL EXAMINATION:  Patient is alert and oriented and appears comfortable.  Patient is breathing regularly, un-labored.   Her abdomen is soft and non-tender with no organomegaly***.   She has no rashes or lesions on her abdomen, inner thighs, or vulva.     Complete Urogynecologic Examination:  External Genitalia:   (Inspection) No erythema, no excoriations, no lesions.  (Palpation) No vulvar cysts, no point tenderness.     Neurologic: Bulbocavernosus reflexes and sensation intact***.  Urethral meatus: No masses, lesions or caruncles.  Urethra: Normal appearing urethra, no masses, no tenderness. Her urethra is *** hypermobile by clinical exam.   Vagina: ***atrophy, no lesions, no masses.  No ulcerations or erosions.   Cervix: ***Normal-appearing, without lesions or masses, no discharge.  Uterus: ***Small, mobile, nontender, no masses appreciated.  Adnexae: No masses or tenderness appreciated.  Perineum:  Appears normal, without lesions or attenuation  Pelvic floor muscles: Kegel  ***/5 strength, symmetric  Myofascial tenderness on exam: *** Highest pain score on exam is ***/10  Anus: ***  resting anal sphincter and *** squeeze tone.  Rectal exam with no palpable masses, nodularity, or evidence of fistula. *** hemorrhoids.    POPQ exam:   Patient position: Supine  Condition: Maximal Strain   Aa:  *** Ba:  *** C:  ***   GH:  *** PB:  *** TVL:  ***   Ap:  *** Bp:  *** D:  ***   Stage *** pelvic organ support    PROCEDURES  Cough stress test (Empty supine) {Positive/Negative:22037} with***out prolapse reduced  Post-void residual: *** ml - by ***scanner ***catheterization  ***     Assessment and Plan    Dajanee 36 y.o. with history of *** with ***           {Complexity of data reviewed (optional):28233::" "}  {Time spent options (optional):28235::" "}               Electronically signed by:  Coralyn Helling, DO  Division of Female Pelvic Medicine and Reconstructive Surgery   Department of Obstetrics & Gynecology & Reproductive Sciences

## 2022-05-19 MED ORDER — PROPRANOLOL 10 MG TABLET
10 | ORAL_TABLET | Freq: Every day | ORAL | 1 refills | 30.00000 days | Status: DC | PRN
Start: 2022-05-19 — End: 2023-03-13

## 2022-05-19 NOTE — Telephone Encounter (Signed)
Current pended order:  propranoloL (INDERAL) 10 mg tablet  Take 1 tablet (10 mg total) by mouth daily as needed (for anxiety) for up to 90 days    Last time approved:  propranoloL (INDERAL) 10 mg tablet  Take 1 tablet (10 mg total) by mouth daily as needed (for anxiety) for up to 90 days  Dispense: 90 tablet Refill: 0  GEN [facility]Z 1545 2 by Kary Kos ALCALAY on 03/11/22        Last office visit:    09/10/2021   Last video visit:     02/26/2022   Last scheduled telephone encounter:  Visit date not found    Next appointment:  Visit date not found    Last  BP:  BP Readings from Last 1 Encounters:   11/28/21 133/75       Last Labs:    Lab Results   Component Value Date    Creatinine 0.87 07/26/2021    Sodium, Serum / Plasma 139 07/26/2021    Potassium, Serum / Plasma 4.1 07/26/2021    Hemoglobin A1c 4.5 07/26/2021    Cholesterol, Total 199 07/26/2021    LDL Cholesterol 133 (H) 07/26/2021    Cholesterol, HDL 53 07/26/2021    Thyroid Stimulating Hormone 1.15 01/02/2020    Hemoglobin 11.7 (L) 11/28/2021    eGFR - low estimate 90 10/24/2020    eGFR - high estimate 104 10/24/2020       If patient has not been seen in clinic for 12 months or more I have routed encounter to the administrative team to book a follow-up appointment.

## 2022-08-08 MED ORDER — NYSTATIN 100,000 UNIT/GRAM TOPICAL POWDER
100000 | Freq: Two times a day (BID) | TOPICAL | 5 refills | 30.00000 days | Status: AC
Start: 2022-08-08 — End: 2022-10-27

## 2022-08-11 MED ORDER — ESCITALOPRAM 20 MG TABLET
20 | ORAL_TABLET | Freq: Every morning | ORAL | 0 refills | Status: DC
Start: 2022-08-11 — End: 2022-11-06

## 2022-08-11 NOTE — Telephone Encounter (Signed)
Current pended order:  diclofenac (VOLTAREN) 1 % gel  Apply topically every morning, afternoon, evening, and before bedtime.    Last time approved:  diclofenac (VOLTAREN) 1 % gel  Apply topically 4 (four) times daily  Dispense: 50 g Refill: 0  [facility] MZ 1545 2 by Louanne Skye, JESSICA ALCALAY on 06/18/21    Current pended order:  escitalopram oxalate (LEXAPRO) 20 mg tablet  Take 1.5 tablets (30 mg total) by mouth every morning for 360 days .crush tablet before taking for 3 months.    Last time approved:  escitalopram oxalate (LEXAPRO) 20 mg tablet  Take 1.5 tablets (30 mg total) by mouth every morning for 360 days .crush tablet before taking for 3 months.  Dispense: 135 tablet Refill: 3  [facility]Z 1545 2 by Louanne Skye, JESSICA ALCALAY on 02/26/22        Last office visit:    09/10/2021   Last video visit:     02/26/2022   Last scheduled telephone encounter:  Visit date not found    Next appointment:  Visit date not found    Last  BP:  BP Readings from Last 1 Encounters:   11/28/21 133/75       Last Labs:    Lab Results   Component Value Date    Creatinine 0.87 07/26/2021    Sodium, Serum / Plasma 139 07/26/2021    Potassium, Serum / Plasma 4.1 07/26/2021    Hemoglobin A1c 4.5 07/26/2021    Cholesterol, Total 199 07/26/2021    LDL Cholesterol 133 (H) 07/26/2021    Cholesterol, HDL 53 07/26/2021    Thyroid Stimulating Hormone 1.15 01/02/2020    Hemoglobin 11.7 (L) 11/28/2021    eGFR - low estimate 90 10/24/2020    eGFR - high estimate 104 10/24/2020       If patient has not been seen in clinic for 12 months or more I have routed encounter to the administrative team to book a follow-up appointment.

## 2022-08-11 NOTE — Telephone Encounter (Signed)
RN called pt. No answer. Left msg to call back to let us know the pregnancy status.

## 2022-08-13 MED ORDER — DICLOFENAC 1 % TOPICAL GEL
1 | Freq: Four times a day (QID) | TOPICAL | 0 refills | Status: DC | PRN
Start: 2022-08-13 — End: 2022-10-27

## 2022-08-13 NOTE — Telephone Encounter (Signed)
Dr. Fredia Beets,   Please assist.     S/w pt. Confirmed not pregnant. However, given warning banner, need to defer refill to PCP

## 2022-08-25 NOTE — L&D Delivery Note (Cosign Needed Addendum)
OBSTETRICS DELIVERY NOTE     Completed by resident with Certified Nurse Midwife Delivery Note below  Delivery Note    Patient Name/MRN: Kayla Frey, Kayla Frey [id number]   Delivery date/time: 05/14/2023  5:24 PM   Delivering Attending:     Jenesis Amaral is a 37 y.o. G1P1 who presented at [redacted]w[redacted]d.  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 Upon admission, her SVE was c/l/h and she was intact. She was GBS positive and received intrapartum ampicillin. She used an epidural for pain control. Intrapartum course was complicated by multiple episodes of fetal decelerations that improved with position changes and with turning off the pitocin. She did not require any dose of terbutaline. She progressed through labor slowly but ultimately progressed to complete at 1653 on 9/19. She pushed for approximately 30 minutes. Once the head was crowing, the neonate began having a deceleration down to the 80-90s. An external fetal monitor was used to confirm FHR. She was recommended to continue pushing outside of a contraction given this deceleration and the neonate's head delivered in the DOA position within one push, followed by anterior shoulder, posterior shoulder, and body without difficulty. There was a nuchal cord and was reduced. Pediatrics was already present in the delivery given the decels throughout intrapartum course. Neonate had poor tone and pallor so cord was immediately clamped and cut and handed to pediatrics for evaluation. Active management of the third stage was initiated and placenta was delivered intact. Perineum was inspected and found to have a 1st degree perineal laceration, repaired with a 3-0 Vicryl Rapide in the standard fashion. Sponge, lap, and needle counts were correct.     ID Labs (Last result from the past 8760 hours)        03/06 1134    Hep B surf Ag       NEG                NEG         HIV Ag/Ab, 4th Gen       NEG  Comment: (Reported by Lab to [employer]. Physician  reporting also required.)               Pending Labs       Order Current Status    Surgical Pathology Specimen Source (enter 1 per line): placenta Collected (05/14/23 1808)            Findings:   - Vertex, spontaneous  - viable female infant, apgars - 2/4, 6 lb 7.2 oz (2925 g)  - EBL 0  - QBL 250 mL   - Lacerations: 1st    - Cord gases:         05/14/23  1724   SRCE Capillary   PH37 7.13   PCO2 61*   HCO3 20*   BEX Neg 9.0     - Placenta: centric, 3v cord  - Delivery Attending: Kathleen Argue, Delivery assistant Pershing Cox, [place]    Labor:  IOL: Yes     TOLAC:  No    Delivery Doloris Hall  Resuscitation:  cpap  Cord informtation: . Complications: . Insertion:    Placenta: delivered    ,  appearance  Weight in grams: 759  Newborn Measurements:  Weight: 6 lb 7.2 oz (2925 g)  Length:   Head circumference: 12.205 cm       Other providers:       Daneen Schick, MD (PGY-1)  [facility]   [phone]

## 2022-08-28 ENCOUNTER — Ambulatory Visit: Payer: MEDICAID | Attending: Physician | Primary: Student in an Organized Health Care Education/Training Program

## 2022-08-28 DIAGNOSIS — N871 Moderate cervical dysplasia: Secondary | ICD-10-CM

## 2022-08-28 NOTE — Progress Notes (Deleted)
CC:  37 y.o. 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  Neg ECC.    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 Lonna Cobb, and see a nutritionist.  I ordered a saline sono, not yet done.      {Common ambulatory SmartLinks:19316::"Patient's allergies, medications, past medical, surgical, family and social histories were reviewed and updated as appropriate."}      Objective:     EXAMINATION:  There were no vitals taken for this visit.  General: {Exam; general:16600::"alert","appears stated age","cooperative"}  Ext gen: {exam; genital female:30844::"normal general appearance","normal Bartholins, urethra, Skene's"}  Vulva: {Desc; normal/abnormal w/wildcard:19060::"normal"}  Vagina: {exam; pelvic vaginal:30846}  Cervix: {exam; gyn cervix:30847}      Impression/Plan:    Kayla Frey is a 37 y.o. 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  If positive, return for colpo  If negative repeat in 12 months (test yearly until HPV neg x 3, then test Q36yr x 25 years s/p LEEP)    Tilman Neat, MD

## 2022-09-22 ENCOUNTER — Telehealth
Admit: 2022-09-22 | Payer: MEDICAID | Attending: Student in an Organized Health Care Education/Training Program | Primary: Student in an Organized Health Care Education/Training Program

## 2022-09-22 DIAGNOSIS — Z1159 Encounter for screening for other viral diseases: Secondary | ICD-10-CM

## 2022-09-22 MED ORDER — TIRZEPATIDE (WEIGHT LOSS) 2.5 MG/0.5 ML SUBCUTANEOUS PEN INJECTOR
2.5 | SUBCUTANEOUS | 0 refills | Status: AC
Start: 2022-09-22 — End: 2022-10-22

## 2022-09-22 NOTE — Telephone Encounter (Signed)
Dear [facility] team- appreciate assistance with submitting PA for Ms Cousineau for zepbound. Thanks!'      PA information for zepbound    Starting weight: 303 lb    Height: 172.1 cm    Starting BMI:46.5    Is this patient enrolled in medical weight loss and unable to lose weight? This patient has been enrolled in a medical weight loss program. This program includes lifestyle change counseling on reduced calorie diets, exercise, and behavioral therapy. Despite being in the program for over 6 months, this patient has not lost weight.     Enrolled in [place] weight loss program since 2021. 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 I have checked my patient for hypothyroidism. This patient's TSH level was normal on 01/02/2020.  I do not believe this patient has any other hormonal imbalance, like hypercortisolism, because this patient does not have any other signs or symptoms related to hypercortisolism (no moon facies, easy bruising, purple striae, muscle wasting, or dorsocervical fat pad)    What other medications for weight loss have been tried?   Has tried diet, exercise, bariatric surgery and supervised weight loss program with [place] [facility]     Why did they fail?   Ineffective     When were they tried? 2021- 2024    What comorbidities does this patient have HLD    I, as the provider, attest that the patient has been counseled on ALL of the following:  --Realistic expectations of weight loss   --Need for continued calorie reduction and exercise, as able  --Chronic nature of weight-loss pharmacotherapy  --Serious safety concerns and common tolerability concerns with the requested product

## 2022-09-22 NOTE — Patient Instructions (Addendum)
Dear Kayla Frey,    It was good to see you in clinic today. This document is your After Visit Summary, which includes what we discussed in clinic today, what the next steps are, and a list of your medications.     Things you should do after you leave clinic today:    - Please complete a pregnancy test and let me know the results! :)     - I have ordered another ultrasound hysterosonogram. Please call on the first day that you get your period to schedule the exam.     I am including the scheduling information that was previously shared with you by the obgyn provider for scheduling the ultrasound and also instructions for how your partner can submit his semen analysis below    The weight loss medication we talked about today is called zepbound (or tirzepatide). We will submit a prior authorization to try and get this covered and then we would schedule you for a nurse visit to teach you how to use the medication      To schedule your saline sono to check your fallopian tubes:  Call the radiology department at [phone].  Your goal is to schedule the saline sono on day 7-10 of your cycle (Day 1 = the first day of bleeding during your period).  The hope is that this will be soon after your period finishes.  If you are having spotting the day of the procedure, it may need to be rescheduled.     How should I prepare for my saline sono?    No unprotected once your period starts  Take both ibuprofen (motrin, advil) 800mg  and tylenol 1000mg  1-2 hours before your appointment time. This will help prevent cramping & pain  Take lorazepam 2mg  1 hour before your appointment.  This will help with anxiety.  DO NOT DRIVE.     How is a saline sono done?  A woman is positioned on a gynecology bed.  The gynecologist or radiologist then examines her uterus and places a speculum in her vagina. Her cervix is cleaned, and a device (cannula) is placed into the opening of the cervix. The doctor then gently fills the uterus with a saline (salt  water) through the cannula. Then an ultrasound is performed to see if there is any scar tissue within the uteruine cavity     Is it uncomfortable?  A saline sono usually causes mild or moderate uterine cramping for about five to ten minutes; however, some women may experience cramps for several hours. The symptoms can be greatly reduced by taking medications used for menstrual cramps. Women should be prepared to have a family member or friend drive them home after the procedure.                 Semen Analysis--I'd recommend doing this in September if you're not pregnant by then     An order has been placed for a semen analysis - this can be done at the [facility] at [facility]. There may be an out of pocket fee for the service - some insurance plans cover some or all of the cost. We recommend you contact your insurance provider directly to inquire.     For PPO insurance plans, please call [phone] to schedule your appointment and drop off. Instructions for collection are below.     For [facility] insurance plans, you may require authorization from your primary care provider. You can provide your  provider/insurance company with the following codes for the request:      Basic Semen Analysis: CPT [phone] OR      Semen Analysis with Strict Morphology: CPT [phone]     Instructions for Semen Collection     You may produce your specimen in one of our collection rooms or bring your specimen from home. To schedule your appointment, please [phone], option 1 If no one answers, please leave a message stating you are calling to schedule a semen analysis. You will be assigned an appointment time for dropping off your specimen or using the collection room and you will be asked to provide picture identification before the laboratory staff will accept your specimen     No one other than the female providing the specimen can drop off a specimen for insemination.  THERE IS NO EXCEPTION TO THIS POLICY.      Important steps to follow:      Please refrain from ejaculating 2 to 3 days prior to anticipated insemination date, but no longer than 5 days. Longer abstinence may impair semen quality.      If bringing your specimen from home, specimen must be received within 1 hour of collection. While transporting a specimen from home, be sure to keep the specimen container warm against your body. Your specimen must be in a sterile specimen cup which must be obtained from our office. The label on the specimen container MUST be filled out completely with both partners names. AN UNLABELED SPECIMEN CONTAINER MUST BE DISCARDED.      If any portion of the sample spills, please inform the laboratory of this fact, so they can note it in their test interpretation.      Specimens should be collected by self-stimulation. Avoid the use of lubricants if possible. Lubricants and some other substances can contaminate the specimen and/or compromise sperm quality. If it is necessary, use mineral oil provided by the clinic.      DO NOT collect specimen through intercourse as this may cause bacterial contamination. Special collection condoms can be provided upon request. DO NOT use regular condoms as they may contain spermicide which can kill the sperm.      Avoid hot tub use and/or recreational drugs before providing a specimen for insemination. (Sperm usually has a 70-90 day production cycle before maturing. Even if you participated in these activities with in the past 3 months, avoiding them with in a short period of time before insemination may be helpful.)      BRING PICTURE IDENTIFICATION. Patients will be asked the date, time of collection and # of days of abstinence.     On the Day of your appointment:      Go to [address] 6th floor at your scheduled appointment time.      Check in with the receptionist.      On the 6th floor our lab personnel will check your identification and take your specimen or take you to the  collection room. You will not be able to produce/drop off without a photo ID.     For IUI patients, female partner will come back later at her scheduled time for IUI.     -----------------        Timing for Fertility     You are most fertile in the few days leading up to ovulation.  Most women ovulate 10-14 days before their next period.  So, for example,   if you have a 30 day  cycle, you likely ovulate somewhere between Day 16- Day 20, and are most fertile from Day 12-20  if you have a 40 day cycle, you likely ovulate somewhere between Day 25- Day 30, and are most fertile from Day 22-30     For most women with a monthly cycle, aiming to have sex every other day from Day 10-20 will be a successful strategy  Every other day seems just as good as every day.  It's best NOT to abstain all month long & then wait for ovulation--sperm quality may decline with this approach, and if you're wrong about when ovulation occurs, you'll miss your window altogether!   If you are struggling to conceive, you might avoid using lubricants, or perhaps use Pre-Seed lubricant.  Regular lubricant might inhibit sperm motility (ability to move well)     If you want additional help, you could consider  --reading the book Taking Charge of Your Fertility, which teaches you how to chart your cycles in detail & estimate when you are most fertile  --using the Natural Cycles app, which guides you through charting your cycles in an app-based format  --using an ovulation predictor kit to help you figure out when you are ovulating (Clear Blue Easy Digital is easy to understand, but expensive!)     ------------------------------  Letrozole for Help Getting Pregnant     Letrozole is an aromatase-inhibitor that is commonly used to help treat women who have breast cancer.  However, a number of large studies have also shown its usefulness in helping women with PCOS get pregnant.  This is an "off label" use, as letrozole has not been approved by the FDA for  help in getting pregnant.     There is a risk of multiples (twins or more) with letrozole.  Twins have been reported in ~3% of cases.  This appears to be about half as likely that when clomiphene citrate (clomid) is used.  Other side effects include fatigue (~20%) and lightheadedness (~10%).     Below are instructions for its use as a fertility treatment.       Remember, Day 1 is the first day of your period (convincing period flow, not just slight spotting)     1. Take letrozole 2.5mg  on days 3-7 of your menstrual cycle.  In other words, you'll take one tablet a day for a total of 5 days.  Day 1 is the first day of your period.     2.  Have sex every other day on day 10-20 of your cycle.   Ovulation usually occurs 5-12 days after the last day of letrozole administration      3.  Check for signs of ovulation.  Begin checking for ovulation using an ovulation detector* on day 10 of your cycle.  Ovulation usually occurs 14-26 hours after a positive test, but can be up to 48 hours.  Once the test is positive, you do not need to continue to check the ovulation test.  If you DON'T detect ovulation, then return to the lab for a progesterone level on day 21-23 of your cycle.  (You can skip this step if your ovulation test turned positive).     4.  Plan your next cycle if needed.  If you don't get pregnant, but you did ovulate on the first cycle, you may continue with the same dosage of letrozole.    If it seems like you did NOT ovulate, you may need to increase the dose of letrozole.  Email me through [place] so I can change your prescription if needed.     5.  What to do if your period doesn't come.  Check a pregnancy test if your period is late.    If you do not get a period after letrozole, and your pregnancy test is negative, you will need to take provera to start your period.  After a negative pregnancy test, take provera 10mg  once a day for 7 days in a row.  You should have a period within 2 weeks of stopping the  provera . If you don't get a period, repeat the pregnancy test and call the office.     6.  Keep track of your cycles and stay in touch.  Once you are ovulating well and timing intercourse to coincide with ovulation, continue for 3-6 months.  If you are not pregnant at the end of 3-6 cycles, we will reevaluate your history and take the next step in your infertility evaluation.  Good luck!  Call us with any questions: [phone]        *I recommend using Clear Blue Easy Digital ovulation predictor kit, as it is simpler to tell a positive from a negative result.  However, any over the counter kit that you feel comfortable using is just fine.  Please don't hesitate to reach out with any questions or concerns. MyChart is a good way to reach me or a colleague for non-urgent questions (usually a turn-around time of 3 days). For more urgent matters, it's best to call the clinic at [phone]. There is always a nurse on-call to triage questions during regular business hours and schedule same-day appointments, and there is a physician on call 24/7.      Please let me know if you have any questions or concerns going forward.     Best,    Kary Kos, MD      Eating Healthy Foods: Care Instructions  With every meal, you can make healthy food choices. Try to eat a variety of fruits, vegetables, whole grains, lean proteins, and low-fat dairy products. This can help you get the right balance of nutrients, including vitamins and minerals. Small changes add up over time. You can start by adding one healthy food to your meals each day.    Try to make half your plate fruits and vegetables, one-fourth whole grains, and one-fourth lean proteins. Try including dairy with your meals.   Eat more fruits and vegetables. Try to have them with most meals and snacks.   Foods for healthy eating    Fruits    These can be fresh, frozen, canned, or dried.  Try to choose whole fruit rather than fruit juice.  Eat a variety of colors.     Vegetables    These can be fresh, frozen, canned, or dried.  Beans, peas, and lentils count too.    Whole grains    Choose whole-grain breads, cereals, and noodles.  Try brown rice.    Lean proteins    These can include lean meat, poultry, fish, and eggs.  You can also have tofu, beans, peas, lentils, nuts, and seeds.    Dairy    Try milk, yogurt, and cheese.  Choose low-fat or fat-free when you can.  If you need to, use lactose-free milk or fortified plant-based milk products, such as soy milk.    Water    Drink water when you're thirsty.  Limit sugar-sweetened drinks, including soda, fruit drinks, and sports drinks.  Where can you learn more?  Go to http://blackburn.com/  Enter T756 in the search box to learn more about "Eating Healthy Foods: Care Instructions."  Current as of: May 14, 2022               Content Version: 13.9   2006-2023 [employer].   Care instructions adapted under license by your healthcare professional. If you have questions about a medical condition or this instruction, always ask your healthcare professional. [place] any warranty or liability for your use of this information.

## 2022-09-22 NOTE — Progress Notes (Signed)
Subjective    Kayla Frey is a 37 y.o. female who presents with the following:    Chief Complaint              Medication Ozempic for weight loss     Infertility     Obesity                History of Present Illness     Kayla Frey is a 4W w h/o prior tobacco use, PCOS, 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     - 02/26/22- last clinic visit w me- infertility, varicose veins, mood sxs    #Infertility   - cycle has been regular recently, lasting 4- 6 days    - has been having unprotected sex during ovulation window  - is one week late with period and is cautiously optimistic, is going to take pregnancy test soon!    #Obesity  - Is very plateaud with her weight: 298- 306  - is thinking about getting skin removed but is wondering if can loose more weight before then  - her goal weight is: 240-45 lbs  - is interested in starting weight loss medication       Wt Readings from Last 3 Encounters:   03/12/22 (!) 137.4 kg (303 lb)   01/29/22 (!) 131.5 kg (290 lb)   10/29/21 (!) 136.1 kg (300 lb)         #Mood sxs  - feels good, motivated, energized  - lexapro 30mg    - is taking propranolol 10mg  before class only during days when going to class  - started to enroll in a class: child development course, math class, failed and then re-me  - had been taking care of cousin's baby who was withdrawing from drugs, and she was taking care of both of her kids and was very stressful, kids are now with the mother's sister  - taking 4 courses and [place] all of the       02/26/2022     8:28 AM 10/23/2021     8:36 AM 10/25/2020    10:58 AM 03/01/2020    10:26 AM 01/11/2020    12:28 PM   PHQ-9 Total   PHQ-9 Total Score 4 2 1 2 2            02/26/2022     8:28 AM 10/23/2021     8:35 AM 07/26/2021     1:46 PM 10/25/2020    10:58 AM 03/01/2020    10:27 AM   GAD-7 Total   GAD-7 Total Score 3 5 10 2 1                Objective      Vitals      Flowsheet Row Most Recent Value   Pain Score 0               Physical Exam  Gen: well-appearing, NAD  Pulm: breathing comfortably on RA, no respiratory distress  Skin: no visible rashes or lesions  Neuro: A&OX3, moves all ext spontaneously   Psych: appropriate affect      Review of Prior Testing       Assessment and Plan       Kayla Frey is a 76W w h/o prior tobacco use, PCOS, 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     Visit Diagnoses       Female infertility  Overview     Has been trying to conceive with current partner for > 2 years. Has various risk factors for increased difficulty w conception including: obesity, PCOS, irregular menses and prior STIs. Recently established care w [facility] and MFM for infertility w/u and assistance  - take pregnancy test today as menses 1 week late!   - re- ordered US hysterosonogram as order expired  - semen analysis  - follow up obgyn          Relevant Orders    [place] Hysterosonogram with CONTRAST ENHANCED (HyCoSy)        Severe obesity (BMI >= 40)    Overview     Established. Previously w Class III, severe, long-standing obesity. Failed various past diets (keto, paleo, atkins, starvation, diet pills), etc w steady weight gain since early 20s. Now s/p laparoscopic sleeve gastrectomy at [place] (05/31/20) and followed by [facility]. Has lost over 50 lbs since surgery! No ab pain, N/V and tolerating small frequent meals, taking all daily supplements and exercising daily, very motivated to continue weight loss. Weight has plateaud and pt is interested in starting wt loss medication. Explained supply issues with semaglutide at this time, so will prescribe zepbound  - submit prior auth for zepbound  - cont follow up w [place] [facility]   - cont follow up w nutrition  - cont daily post- gastrectomy dietary supplements  - cont daily exercise and frequent small meals  Wt Readings from Last 3 Encounters:   01/29/22 (!) 131.5 kg (290 lb)   10/29/21 (!) 136.1 kg (300  lb)   10/23/21 (!) 136.1 kg (300 lb)             Relevant Medications    tirzepatide, weight loss, (ZEPBOUND) 2.5 mg/0.5 mL injection pen        Medication management    Overview     When sending a prior authorization to start a medication for weight loss, please include the following information in a separate note. Route the encounter to your pharm tech.    Starting weight: 303 lb    Height: 172.1 cm    Starting BMI:46.5    Is this patient enrolled in medical weight loss and unable to lose weight? This patient has been enrolled in a medical weight loss program. This program includes lifestyle change counseling on reduced calorie diets, exercise, and behavioral therapy. Despite being in the program for over 6 months, this patient has not lost weight.     Enrolled in [place] weight loss program since 2021. 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 I have checked my patient for hypothyroidism. This patient's TSH level was normal on 01/02/2020.  I do not believe this patient has any other hormonal imbalance, like hypercortisolism, because this patient does not have any other signs or symptoms related to hypercortisolism (no moon facies, easy bruising, purple striae, muscle wasting, or dorsocervical fat pad)    What other medications for weight loss have been tried?   Has tried diet, exercise, bariatric surgery and supervised weight loss program with [place] [facility]     Why did they fail?   Ineffective     When were they tried? 2021- 2024    What comorbidities does this patient have HLD    I, as the  provider, attest that the patient has been counseled on ALL of the following:  --Realistic expectations of weight loss   --Need for continued calorie reduction and exercise, as able  --Chronic nature of weight-loss pharmacotherapy  --Serious safety  concerns and common tolerability concerns with the requested product                      Other Visit Diagnoses (not on the problem list)       Need for hepatitis B screening test    -  Primary    Relevant Orders    Hepatitis B Surface Antibody, Quantitative    Hepatitis B Surface Antigen    Hepatitis B Core Antibody, Total                        I reviewed external records from 1 providers outside my specialty or healthcare organization as summarized in the note.    I, Elsie Amis, MD, spent a total of 45 minutes on this patient's care on the day of their visit excluding time spent related to any billed procedures. This time includes time spent with the patient as well as time spent documenting in the medical record, reviewing patient's records and tests, obtaining history, placing orders, communicating with other healthcare professionals, counseling the patient, family, or caregiver, and/or care coordination for the diagnoses above.      I performed this evaluation using real-time telehealth tools, including a live video Zoom connection between my location and the patient's location. Prior to initiating, the patient consented to perform this evaluation using telehealth tools.

## 2022-09-26 NOTE — Telephone Encounter (Signed)
MEDICATION PRIOR AUTHORIZATION    Rx (name, strength, formulation): tirzepatide, weight loss, (ZEPBOUND) 2.5 mg/0.5 mL injection pen     PA Status: Pending  Submitted on 09/26/22 Via CMM (Key: [id number]) as standard    Insurance: [place]   Member ID: [id number]   QTY and Day Supply: 2 ml/28 days     Comments: patient has been notified via [place]    Thank you,     ALEX (They/Them/Theirs)  Pharmacy Technician, [facility]

## 2022-09-26 NOTE — Telephone Encounter (Signed)
-----   Message from Elsie Amis, MD sent at 09/22/2022  9:46 AM PST -----  Please help w PA for zepbound thank you!

## 2022-09-29 NOTE — Telephone Encounter (Signed)
Pt notified positive home pregnancy test. Instructed to start prenatal vitamins and contact obgyn clinic to schedule first prenatal appt

## 2022-10-07 NOTE — Telephone Encounter (Signed)
PRIOR AUTHORIZATION DENIAL    Rx (name, strength, formulation): tirzepatide, weight loss, (ZEPBOUND) 2.5 mg/0.5 mL injection pen      PA Status: Denied  PA Case#/CMM Key (if applicable): Key: [id number] , PA Case ID: [id number]    Insurance: [place] [employer]   Member ID: [id number]   QTY and Day Supply: 2 ml/28 days     Denial Reason: Deniedon February 12  Denial: Prerequisite Drug Not Tried: Not Medically Necessary    Next Steps: patient has been notified via [place]    Thank you,     ALEX (They/Them/Theirs)  Pharmacy Technician, [facility]

## 2022-10-08 NOTE — Telephone Encounter (Signed)
Notified pt, offered to refer to wt mgmt clinic and discuss alternatives at next appt

## 2022-10-21 ENCOUNTER — Inpatient Hospital Stay: Payer: MEDICAID | Primary: Student in an Organized Health Care Education/Training Program

## 2022-10-21 ENCOUNTER — Ambulatory Visit
Admit: 2022-10-21 | Payer: MEDICAID | Attending: Nurse Practitioner | Primary: Student in an Organized Health Care Education/Training Program

## 2022-10-21 DIAGNOSIS — Z3401 Encounter for supervision of normal first pregnancy, first trimester: Secondary | ICD-10-CM

## 2022-10-21 DIAGNOSIS — N912 Amenorrhea, unspecified: Secondary | ICD-10-CM

## 2022-10-21 MED ORDER — PRENATAL VITAMIN-FERROUS FUMARATE 28 MG IRON-FOLIC ACID 800 MCG TABLET
28 | ORAL_TABLET | Freq: Every day | ORAL | 3 refills | Status: DC
Start: 2022-10-21 — End: 2023-05-15

## 2022-10-21 MED ORDER — ASPIRIN 81 MG TABLET,DELAYED RELEASE
81 | ORAL_TABLET | Freq: Every day | ORAL | 6 refills | 90.00000 days | Status: DC
Start: 2022-10-21 — End: 2023-05-15

## 2022-10-21 NOTE — Progress Notes (Signed)
Subjective: 37 y.o. G0P000, presents to clinic for initial OB visit for supervision of pregnancy and dating ultrasound. LMP unsure, due to history of irregular menses, possible 12/16 or 12/17. Pt had positive UPT on 09/26/22. Pt had scant blood when wiping that and one week later. Pt denies any other pain, bleeding, spotting or cramping. Pt desires this pregnancy. Pt stopped taking all prescription medications after having positive pregnancy test.       CC: Dating ultrasound. Pt stopped taking Lexapro '30mg'$  and Propanolol '10mg'$  after +UPT, however she feels that she needs it for anxiety and depression. Phq-9, GAD9 on this visit. Pt concerned if she should continue taking Valacyclovir for prevention of HSV outbreak, she also stopped taking this on 2/2. Pt had positive antibodies for HSV about 3 months ago and started taking Valacyclovir to prevent outbreak of potential transmission to partner, pt has never had an outbreak or lesions. AMA. Pt c/o dental pain when it is cold and is requesting dental referral.       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 Plan was to repeat colposcopy or HPV testing in 2023, this was not completed. Prior history or tobacco use. Blood transfusion in 2022.     Pt denies dumping syndrome since having bariatric surgery.    Allergies: Iodine, shellfish, Walnuts (anaphylaxis), morphine    Meds:  Biotin  BP monitor  Epi pen  Lexapro 30 mg (stopped taking 2/2)  Propanolol 10 mg (stopped taking 2/2)  Valacyclovir (stopped taking 2/2)      Health Care Maintenance:    Immunizations - need Flu vaccine  Screenings - Need to repeat Colposcopy or HPV  testing  TB risk screen- normal  Blood products- accepts if needed        FHx:   Mother-Mental health issue (Johnathyn Viscomi), drug abuse (crack cocaine), Ovaria cysts,   Father-Colon cancer, HTN, heart disease, drug abuse (crack cocaine),  Brother-Depression, anxiety, alcoholism         PSH:  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   Living situation-Lives in [place] with family, parttime in [place] where she stays during the week when she is attending school there.   In relationship with FOB.   Substance use (type/frequency/amount)   Tobacco-no longer smoking tobacco   Alcohol-none   Other drugs-smokes cannabis twice daily by bong.  Relationship (IPV screening)-Feels safe in current relationship, denies IPV  Denies food insecurity or unstable housing    Objective:    Vitals: BP 154/69, 136/69 on recheck. Heart and Lungs clear to auscultation. Pulses palpable and regular. No edema or skin abnormalities noted.     Dating ultrasound done in clinic on 2/26-Viable pregnancy at 9 weeks 1 day, with EDD of 05/25/2023      Assessment/Plan:    Dx: Supervision of complex pregnancy    Tx: Refer made to complex OB, Refer to [facility] for support services, Dental referral. First trimester labs, baseline pre-E labs, Vitamin D labs, CT/CG, early GLT, NIPT, Genetic carrier and urine ordered. First trimester [place] ordered. ASA ordered.      Pt Ed:   Talked with patient about keeping a BP log, taking BP at the same time each day, when she is calm and has  been sitting. Bring the BP log to next visit.  Discussed the risks and benefits of takin Lexapro during pregnancy. Pt will talk with OB about the benefits vs risks of resuming Lexapro during pregnancy.  Discussed the benefits of started low-dose aspirin as a means to reduce the frequency of preeclampisa.  Encouraged patient to continue wearing gloves when cleaning cat's litter box, if she cannot get anyone else to clean litter box for her.  F/U:  Follow-up apt with  [facility]   Referral made and continue care with  complex OB.    Mar Kaw Charlynne Pander, RN, Student NP  Seen with [name], NP

## 2022-10-21 NOTE — Progress Notes (Unsigned)
LMP 12/28 or 12/29: [redacted]w[redacted]d or 2d, EDD 05/27/22 or 05/29/23?  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  Elevated BPs: 143/64 07/26/21, 136/86 09/10/21, 152/92 10/16/21, 133/75 11/28/21 - consider cHTN dx  S/p LEEP  Hx ASC-H pap, HR HPV +, colpo 11/18/21, LEEP CIN 2 11/2021, plan was for (colpo/ECC or) HPV testing in October    To do: REFER [facility]PSP  Dating [place]  Labs + Vit D + preE baseline + nutrition labs + early GLT (dumping syndrome?)  NIPT, CT/GC, urine, HR HPV  ASA?  Refer GC and complex OB, known to [facility] [place] from preconception counseling 01/2022

## 2022-10-22 NOTE — Telephone Encounter (Signed)
Pt wants to discuss medications she can take while pregnant and discuss labs for + chlamydia.     Thank you!

## 2022-10-23 ENCOUNTER — Institutional Professional Consult (permissible substitution): Admit: 2022-10-23 | Payer: MEDICAID | Primary: Student in an Organized Health Care Education/Training Program

## 2022-10-23 DIAGNOSIS — Z789 Other specified health status: Secondary | ICD-10-CM

## 2022-10-23 LAB — URINE CULTURE: Bacterial Culture, Urine w/o gram stain: >100000 — AB

## 2022-10-23 MED ORDER — AZITHROMYCIN 500 MG TABLET
500 | ORAL_TABLET | Freq: Once | ORAL | 0 refills | Status: AC
Start: 2022-10-23 — End: 2022-10-23

## 2022-10-23 NOTE — Telephone Encounter (Signed)
Call to patient to discuss + chlamydia result - LM, will send MyChart message

## 2022-10-23 NOTE — Progress Notes (Cosign Needed Addendum)
Comprehensive Perinatal Services Program (CPSP)   SW- referred 2/29   needs identified: MH services due to increased anxiety after stopping lexapro and propalonol due to pregnancy.  RD- referred 2/29. Pt had gastric surgery 2021 and needs guidance around fulfilling nutritional needs considering dietary limitations.   WIC-referred WIC website.(2/29)  Dental-has not had dental check up in last 12 months. Already connected to [facility] and has obtained signed authorization form to proceed with dental cleaning scheduling.(2/29)   PHN-referred [facility].(2/29)  Doula-referred to [facility])  [facility]- informed patient on 2/29. Sent information via MyChart.    Other-pt still smokes marijuana in order to alleviate anxiety since she has stopped lexapro and propalnol. Smokes a quarter of marijuana via bong x2 a day. Pt expresses extreme caution on not exposing baby to addictive medications/substances. Pt open to medication alternatives that are safe during pregnancy to terminate marijuana use.   Trimester 2 assessment-scheduled 5/23 at 9:00 AM.    Orientation Checklist:   Services to be provided:   Medical: Exams, routine lab tests, and other tests needed to check your health and the health of your baby.   Health education: Assessments, information and/or classes about pregnancy, childbirth, breastfeeding, infant care, etc.   Nutrition: Assessments and help with eating healthy foods.  Psychosocial: Assessments and counseling regarding personal problems or family issues.    External referrals: Outside sources that can provide additional help and services.    Internal referrals: We offer everyone the opportunity to meet with our Registered Dietician and Child psychotherapist, at least once during their pregnancy/postpartum.   Practitioner who will provide the services:   Our goal is for continuity of care with the same OB and CPSP provider (as schedules allow).   You are welcome to ask to be scheduled with a  different provider anytime throughout your pregnancy/postpartum.   Where to obtain the services:   CPSP assessments and any needed follow up will be via zoom or telephone, based on your preference.   Clinic locations are: [address] or [address] based on your preference.  When the services will be delivered:  Most patients with uncomplicated pregnancies have around 8-10 prenatal visits. The visits can be spaced out to 6 weeks until 28 weeks of pregnancy, 4 weeks until 36 weeks, and not closer than 2 weeks until the end of pregnancy.  In addition to your prenatal visits, you'll have two formal/detailed ultrasounds.   Youll be seen for a postpartum visit at 6-8 weeks after you have your baby.  CPSP assessments will be offered at least once every trimester, or more often if needed.   There will be a postpartum assessment to provide you with support for breastfeeding, assess your new mother needs, and answer questions about infant care.   Contact Information:   You will receive a pregnancy guidebook at your first visit.   For urgent concerns, call the [facility]: [phone] (available 24/7)  For appointments, call [phone]  our staff will assist you in scheduling at any of our locations  this number can also connect you to our advice nurse during business hours (Monday-Friday: 8:00am-4:30pm) or the on-call provider at any time after business hours (however, the first # has less prompts to get through).   Patient rights include:   keeping your medical information private and not giving it to anyone else unless you provide written permission.   reporting abuse or violence so that you can get extra help.  If we don't file a report, the clinic should call the agency that can best help you.   helping plan and make choices about your care while you're pregnant, in labor, or giving birth.  accepting or refusing any care, treatment, or service.   Participation is voluntary  Orientation checklist  status: Completed  Minutes spent: 15  Date completed: 10/23/2022    Assessment and Individualized Care Plan  Assessment for: Initial Assessment    1. Pregnancy Information   DOB: 1986/03/22     Age: 37 y.o.  Estimated date of delivery: 05/28/2023 or 05/29/2023, per notes.  Weeks Gestation: [redacted]w[redacted]d or [redacted]w[redacted]d per notes.    Psychosocial:   OB History       Gravida   0    Para   0    Term   0    Preterm   0    AB   0    Living   0         SAB   0    IAB   0    Ectopic   0    Molar   0    Multiple   0    Live Births   0               How many times have you been preg including this time? How many births have you had and can you tell me about the outcomes of those?   Number of pregnancies (Grav): 1     Number of viable births (Para): 0  Number of therapeutic abortions (TAB): 0  Number of spontaneous abortions (SAB): 0   Psychosocial Risks/Concerns  Psychosocial Individualized Care Plan    2. Planned pregnancy? Yes, was in the process of figuring out if possible infertility issues and then became pregnant.     3. Wanted pregnancy? Yes    4. Considering abortion/adoption? No  N/A   5. FOB/partner accepts pregnancy? Yes N/A   6. Goals for this pregnancy: Healthy baby, break generational cycle, wants to be able to provide and build trust. Wants to ensure proper development especially with a melanated child.     7. Problems with previous pregnancies?   No N/A   8. Previous pregnancy loss/infant death?   No N/A   9. Members of household (not including patient)   Number of adults: 0 has 2 cats and dog  Relationship to patient: NA  Number of children: 0  Relationship to patient: 0    10. Patients children all live with her?   N/A N/A              11. Ever seen a counselor for personal or family problems? No  N/A      12. Ever been emotionally, physically, or sexually abused   by a partner or someone close to you?   No    13. Within the last year ever been hit, slapped, kicked, pushed, shoved, forced to have sex, forced to get pregnant,  or otherwise physically hurt by partner or ex-partner? No N/A         N/A    14. Afraid of partner or ex-partner? No N/A     N/A   15. Currently having any personal or family problems?  No N/A   16. Has someone to turn to for emotional support? Older cousin who is 88 yo, kind of like her mom, and her cousins mother (uncle's ex-wife)  N/A   66.  Currently receiving services from a local agency such as case management, counseling etc.? No, [facility] does offer a program called "timely care" that she may refer to for [facility] services. Last semester flunked math class because of anxiety. Goes to school 5 days a week.  N/A     PHQ-9 Total Score: 4 (10/21/2022  1:58 PM)  GAD-7 Total Score: 9 (10/21/2022  1:58 PM) Referred to Clinic SW, pt would like someone to talk with to discuss her anxiety that has increased since she stopped taking anxiety meds.        19. Did your parents have problems with alcohol or drugs? Yes, describe: starting off father was not involved with drugs or alcohol until he met pts mother. Pts mother was on drugs. Dad was a Archivist, but after being introduced to mother eventually also was on drugs/alcohol. "Alcoholism and MH issues runs deep in their roots."     20. Does your partner have problems with alcohol or drugs? No, socially drinks and smokes marijuana. Does not smoke often but daily.     21. Before you knew you were pregnant, how much beer/wine/liquor did you drink? Occasionally  Describe:   Amount: per day/week/month: Occasionally   Type of alcohol: Not hard liquor  Social History     Substance and Sexual Activity   Alcohol Use Not Currently    Comment: has used socially in past; no history of problem use     N/A       N/A            Reinforced patient's decision not to drink alcohol              22. Before you knew you were pregnant, how much tobacco did you smoke? stopped smoking and is not smoking now  Social History     Tobacco Use   Smoking Status Former    Packs/day: 0.00    Years: 2.00     Additional pack years: 0.00    Total pack years: 0.00    Types: Cigarettes    Start date: 09/14/2014    Quit date: 12/30/2019    Years since quitting: 2.8   Smokeless Tobacco Never   Tobacco Comments    taking chantix to help with smoking cessation           Reinforced patient's decision not to smoke      23. Exposed to 2nd hand smoke at home/elsewhere? No, since Dec 29, 2020 has not picked up a cigarette N/A   24. Before you knew you were pregnant, how much did you usually use marijuana or other drugs? Currently still smokes marijuana, uses this to alleviate anxiety since she has stopped lexapro and propalnol. Smokes a quarter of marijuana via bong x2 a day. Pt cannot take lexapro and propanol since becoming pregnancy. Pt expresses extreme caution on not exposing baby to addictive medications/substances. Pt open to medication alternatives that are safe during pregnancy to terminate marijuana use. Will route to OB.   Social History     Substance and Sexual Activity   Drug Use Never    Comment: no history of Rx med abuse or illicit substance use     Advised of risks (refer to STT), Agreed to cut down/quit: , and Consult with ob provider      25. 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 Pt also receives financial support through SSI. Pt express no barriers with financial support. N/A  26. Type of housing: Apartment/house, pt expresses no barriers with housing. Rental through [address] [employer]  N/A    27. Other psychosocial risk or concern: No Referred to Clinic SW and N/A    Assessment status: Completed  Minutes spent: 30  Date assessment completed: 10/23/2022    Health Education:   Health Education Learning Needs/Risks/Concerns Health Education Individualized Care Plan and Barriers to Learning    28. Likes to learn by: Other: hands on and visual. Can watch a video or read an article. Not into in person classes but would prefer online classes, more of an independent learner.  Pt would be  interested in webinars and online classes through [facility].    29. Person to share in your prenatal education:Yes, has best friend and talks to cousin, Drue Flirt, about everything. Will use patient's preferred learning methods, as available    Interior and spatial designer patient educational classes & webinars via MyChart.    Informed patient of resource and sent link via MyChart:   [facility] Pharmacologist)    Encourage her to involve a support person by Scientific laboratory technician after her appointments    30. Patients primary language: English  Writing/reading: fair  "GREAT"    Other language spoken: NA  none  Writing/reading: N/A    Prefers materials in (language): English N/A   31. Last grade completed: Currently at [employer], graduated high school. Working to transfer to [employer].  N/A   32. Born in the [place]?  Yes  If not, country of birth: NA   Length of time living in [place]    33. Ever used health care services in [place] Yes  NA   34. Any disabilities that affect learning? (such as vision, hearing, learning disabilities)   Yes, describe: "intellectual disability "/ADHD Will adapt health education methods    35. Previous knowledge/experience with:  Pregnancy: Just being around pregnant family/friends     Prenatal Care: No    36. Who gives advice about being pregnant?   other: cousin Drue Flirt, she has 7 kids and had preeclampsia. 3 youngest children have autism. Only 1 child was preeterm, 2 still borns.  Candy's father is pts father's brother.     37. What are the most important things they have advised? General advice Consult with ob provider regarding any harmful advice   38. Exposed to dangers at work or home? other: just regular cleaning supplies, has 2 cats and a dog N/A   39. Interested in learning about prenatal topics such as body changes during pregnancy, babys growth, etc.? Yes N/A   40. Dental check-up within past 12 months? No, made appt to [facility] but they needed an authorization form from the doctor. Needs  to reschedule.     Teeth, gums, or mouth problems? Yes, describe: molars are aching with pregnancy Consult with ob provider    41. Transportation to clinic: car. Pt expresses no barriers with transportation. Suggested solution(s): Demonstrated safe seat belt use and Reviewed What's the Right Way to Wear My Seat Belt    42. Knows how to use seat belt when pregnant? Yes  Demonstrated safe seat belt use and Reviewed What's the Right Way to Wear My Seat Belt   43. Patient has questions about danger signs, preterm labor and when to call the doctor for prenatal concerns? No Send pregnancy guidebook link p. 9 with "Danger Signs During Pregnancy"     Would you like to be referred to a home visiting nursing  program? Yes Referred to [facility] PHN   Questions 44-52 are for Trimester 3 only.     43. Other health education risk or concern: No N/A   Assessment status: Completed  Minutes spent: 30  Date assessment completed: 10/23/2022    Nutrition:    54. Weight pre-pregnancy (can be patient report): see chart    55. Height:   Current Height for Johnelle, Tatsch       Height    170.2 cm (5\' 7" )          56. Weight gain grid used: see chart   Nutrition Risks/Concerns  Nutrition Individualized Care Plan    57. Any foods or food groups avoided (such as meat or dairy)? Yes, list which foods and note reason: pork  does not eat much beef    58. Allergic to foods? Yes, describe: shellfish and nuts.  No major impact on food intake      59. Do you eat raw or undercooked eggs/seafood/meat? No     60. Do you eat deli meats or hot dogs?No     61. Do you eat soft cheeses such as feta, blue cheese, queso de crema, asadero, [employer] or panela? No , colby jack shredded cheese and [place] blend    62. Do you eat fish? Yes, describe: tilapia and salmon     63. Do you eat fish caught by friends or family? No  Advised patient: not to eat raw or undercooked meat/seafood/ eggs, not to eat hot dogs, luncheon meats or deli meats unless reheated to steaming  hot, not to eat soft cheeses unless labels show that they are pasteurized, not to eat any shark, swordfish, tilefish or king mackerel, not to eat more than 6 oz. of albacore tuna per week, not to eat more than 12 oz. of other kinds of fish per week, to check local fish advisories if eating fish caught by friends and family     63. Food or non-food cravings? (non-foods such as ice, plaster, cornstarch, dirt, clay, laundry starch) Yes, describe: craving McDonald's breakfast but doesn't eat pork so can't have it. Craves apple juice.  Seems to be craving more saltiness.  No negative impact on food intake       65. Currently taking (in addition to prenatal vitamins) (if yes, note type, amount, frequency): No N/A   66. Todays weight (lbs.) (chart or patient report depending when assessment is):    Weight gain to date this pregnancy: see chart   N/A    67. Current weight gain appropriate? see chart  N/A    68. Completed the 24-hour recall or food frequency form. Yes N/A   69. Number of times per day usually eats? Had gastric sleeve 7+, snacking. Feels hungry but can't physically eat any more.     Eats less than 3 times/day? No  Advised to eat every 3-4 hours   , Discussed reason for infrequent eating , and Referred to: RD   70. Current discomforts? nausea and constipation , sensitive to smells and food Reviewed STT N: Nausea    71. Nutrition-related medical conditions? (such as obesity, diabetes, hypertension, anemia) Yes, describe: obesity. Sometimes has elevated BP.   Currently has wrist cuff that she bought off [employer], may not work efficiently. Pt states she has had trouble with pharmacy filling BP prescription for unknown reason.   [address]  [place], [place] [address] N/A    Pt AA with [facility], meets criteria to  receive BP cuff. Pt wears 2XL shirt=XL BP cuff.    72. Knowledge or experience with breastfeeding? no knowledge or experience. No one in her family breast fed due to large breasts.   Personal  Experience has been: N/A    73. Planning to breastfeed? Not sure , currently has severe breast/nipple tenderness. Open and willing to try to breastfeed and see how it goes.   Does your family support your decision to breastfeed? Yes Discussed breastfeeding benefits  and Reviewed STT N: Here's How to Get Started       Discussed lactation resources and breastfeeding classes.            74. Currently taking prenatal vitamins? Yes  Encouraged patient to take prenatal vitamins   75. Already enrolled in [facility]? Yes  Site: N/A      76. Ever run out of food? No Referred to [facility] site    77. Have access to a kitchen/way to cook and store food? Yes N/A   78. Physically active at least 30 minutes each day? Yes N/A   79. Reviewed urine ketones, glucose, protein, blood glucose screen, HGB or HCT, and BP:  Obstetric provider to review   80. Other nutrition risk/dietary issue:  N/A    Assessment status: Completed  Minutes spent: 30  Date assessment completed: 10/23/2022    Perinatal Dietary Assessment   Dietary Assessment  Care Plan/Interventions   On a typical day, how many servings of Vegetables do you eat?   1 serving is equal to: 1 cup raw or cooked vegetables, 2 cups raw leafy greens, 1 cup 100% vegetable juice    Never , has some corn, potatoes, and onions. Has dietary limits due to gastric surgery, mainly prioritizes protein (70g/day).    Stomach is full but physically feels hungry. Does not know what to do, cannot stuff herself because she will vomit. Learned how to eat for gastric sleeve but now she's pregnant and not sure how to fulfill nutritional needs considering dietary limitations. The more vegetables you eat, the better., Aim for at least 3 servings/day. , Choose some that are dark green or orange., and Choose fresh, frozen, or canned with no added sauce.     Referring to RD for more guidance.    2. On a typical day, how many servings of Fruit do you eat?   1 serving is equal to: 1 cup or piece of fruit, 1 cup 100% fruit  juice,  cup dried fruit    2 or more servings a day   Bananas, apples, pears, watermelon. As long as she's not eating pears. Eats fruit cups because the fruit is soft and easy for her stomach to break down.  Eat fruits of many colors. Aim for 2 or more servings/day, Choose fresh, frozen or canned with no added sugar, Choose 100% fruit juice. Limit to one small cup a day, and Continue with 3 Servings or more a day.   3. On a typical day, how many servings of Dairy food do you eat?   1 serving is equal to: 1 cup milk or yogurt, 1 cup soy milk with added calcium, 1 oz. hard cheese, 1/3 cup shredded cheese      Less than 3 servings a day  2 servings/day. Yogurt, cheese, has some milk with cereal.  Aim for 3 servings/day, 4 servings if a teen. and Choose milk or yogurt, nonfat or low-fat (1%).   4. On a typical day, how many servings of  Protein food do you eat?   1 serving is: 1 oz. meat, fish or poultry, 1 egg,  oz. or small handful nuts, 1 tablespoon peanut butter,  cup cooked dry beans, peas, lentils,  cup tofu      Less than 6 servings a day  beef jerky, eggs, almonds (snack), chicken, beans with cheese, protein pasta, [place] bacon, jimmy deans breakfast sausage sandwich. Does not reach her goal of 70g protein/day (for gastric sleeve). Aim for 6 servings/day.   5. On a typical day, how many servings of Grains do you eat?   1 serving is: 1 slice bread or  bagel, 1 cup dry cereal,  cup cooked rice, pasta or hot cereal, 3 cups popped corn, 1 small (6) corn or flour tortilla     Less than 6 servings a dayTortillas, not big on rice because it hurts her stomach, bread gets her full but does eat sourdough, surgery requirement was to limit grains.  Aim for 6-8 servings/day., Choose whole grains at least half the time., and Choose oatmeal, brown rice, corn tortillas and 100% whole wheat bread.    6. On a typical day, do you eat solid fats such as lard, shortening, stick margarine or butter?    No Use small amounts of  healthy liquid oils such as canola or olive. and Avoid solid fats such as lard, shortening, stick margarine or butter.   7. On a typical day, how many cups of these beverages do you drink? Soda, fruit punch, sport drinks or energy drinks?    0, rarely drinks mini can of sprite Drink plenty of water. and Avoid sugary drinks like soda, fruit punch, sport drinks or energy drinks.      8.  On a typical day, how many cups of these beverages do you drink? Coffee, tea, soda or energy drinks.     1 Does not drink soda or energy drinks. Used to be a big coffee drinker, trying to cut back. Gets a small mcdonald's coffe but not everyday. Does not like decaf.  Limit coffee to one cup a day (pregnant).    9.  On a typical day, do you eat these extra foods?    Candy, chips, cake, cookies, donuts, muffins, etc.   Yes, just chips and since being pregnant likes veggie straw sticks.     Ice cream, frozen yogurt  Yes, ice cream sandwiches sometimes or vanilla bean ice cream    Sour cream, mayonnaise  No, very rarely  Limit foods high in fat and sugar., Choose low-fat or non-fat products., and Choose fruits, vegetables, nuts and seeds as snacks.   Assessment status: Completed  Minutes spent: 30  Date assessment completed: 10/23/2022    Name: Burt Ek    CPSP Healthcare Navigator  Office of Population Health  Phone: [phone]

## 2022-10-24 MED ORDER — DOXYCYCLINE HYCLATE 100 MG CAPSULE
100 | ORAL_CAPSULE | Freq: Two times a day (BID) | ORAL | 0 refills | Status: AC
Start: 2022-10-24 — End: 2022-10-31

## 2022-10-24 NOTE — Telephone Encounter (Signed)
[facility]  SOCIAL WORK NOTE      Pt was referred for support with MH resources. SW attempted outreach and left voicemail with direct contact. SW also sent MyChart message and encouraged pt to contact SW via phone or MyChart to schedule a call.     Adalberto Ill, MSW  Clinical Social Worker II  [employer]

## 2022-10-28 NOTE — Telephone Encounter (Signed)
Telephoned re:  2PM RD virtual visit for OB MZ NP.  LM to dial in ASAP or will be rescheduled.  ME Gedeon Brandow, RD, IBCLC

## 2022-10-28 NOTE — Progress Notes (Signed)
error 

## 2022-10-29 ENCOUNTER — Ambulatory Visit
Admit: 2022-10-29 | Discharge: 2022-11-03 | Payer: MEDICAID | Primary: Student in an Organized Health Care Education/Training Program

## 2022-10-29 DIAGNOSIS — Z3490 Encounter for supervision of normal pregnancy, unspecified, unspecified trimester: Secondary | ICD-10-CM

## 2022-10-29 LAB — COMPLETE BLOOD COUNT
Hematocrit: 34.3 % — ABNORMAL LOW (ref 36.0–46.0)
Hemoglobin: 11.2 g/dL — ABNORMAL LOW (ref 12.0–15.5)
MCH: 30.9 pg (ref 26.0–34.0)
MCHC: 32.7 g/dL (ref 31.0–36.0)
MCV: 95 fL (ref 80–100)
MPV: 10.6 fL (ref 9.1–12.6)
Platelet Count: 307 10*9/L (ref 140–450)
RBC Count: 3.62 10*12/L — ABNORMAL LOW (ref 4.00–5.20)
RDW-CV: 12.8 % (ref 11.7–14.4)
WBC Count: 9.8 10*9/L (ref 3.4–10.0)

## 2022-10-29 LAB — FERRITIN: Ferritin, Serum/Plasma: 54 ug/L (ref 12–160)

## 2022-10-29 LAB — IRON, % SATURATION AND TRANSFERRIN OR TIBC (DEPENDING ON THE LAB)
% Saturation: 29 % (ref 10–47)
Iron, serum: 86 ug/dL (ref 39–179)
Transferrin, Serum/Plasma: 214 mg/dL (ref 182–360)

## 2022-10-29 LAB — ASPARTATE TRANSAMINASE: AST: 8 U/L (ref 5–44)

## 2022-10-29 LAB — GLUCOSE LOADING SCREEN: Glucose Loading Screen: 117 mg/dL (ref 70–129)

## 2022-10-29 LAB — CALCIUM, TOTAL, SERUM / PLASMA: Calcium, total, Serum / Plasma: 8.8 mg/dL (ref 8.4–10.5)

## 2022-10-29 LAB — VITAMIN D, 25-HYDROXY: Vitamin D, 25-Hydroxy: 12 ng/mL — ABNORMAL LOW (ref 20–50)

## 2022-10-29 LAB — CREATININE, SERUM / PLASMA
Creatinine: 0.65 mg/dL (ref 0.55–1.02)
eGFRcr: 118 mL/min/{1.73_m2} (ref >59)

## 2022-10-29 LAB — ALANINE TRANSAMINASE: Alanine transaminase: 5 U/L — ABNORMAL LOW (ref 10–61)

## 2022-10-29 LAB — SENDOUT KIT

## 2022-10-29 NOTE — Result Quicknote (Signed)
Hep B immune

## 2022-10-30 LAB — SYPHILIS SCREEN BY RPR WITH REFLEX TO TREPONEMAL ANTIBODY: RPR: NONREACTIVE

## 2022-10-30 LAB — TYPE AND SCREEN, PRENATAL
ABO/RH(D): O POS
Antibody Screen: NEGATIVE

## 2022-10-30 LAB — HEMOGLOBIN A1C: Hemoglobin A1c: 4.8 PERCENT (ref 4.3–5.6)

## 2022-10-30 LAB — HEPATITIS B SURFACE ANTIBODY, QUANTITATIVE: Hep B surf Ab, Quant: 43 m[IU]/mL

## 2022-10-30 LAB — HEPATITIS B SURFACE ANTIGEN
Hep B surf Ag: NEGATIVE
Hep B surf Ag: NEGATIVE

## 2022-10-30 LAB — HEPATITIS B CORE ANTIBODY, TOTAL: Hep B Core Ab Total: NEGATIVE

## 2022-10-30 LAB — HIV ANTIBODY AND ANTIGEN COMBINATION TEST: HIV Ag/Ab Combo: NEGATIVE

## 2022-10-30 LAB — HEPATITIS C ANTIBODY: Hep C Ab, Qual: NEGATIVE

## 2022-10-31 LAB — VARICELLA ZOSTER VIRUS IGG, SERUM: Varicella-zoster Antibody, IgG: 1573 Index

## 2022-10-31 NOTE — Progress Notes (Signed)
error 

## 2022-10-31 NOTE — Telephone Encounter (Signed)
SOCIAL WORK NOTE    Kayla Frey is a 37 y.o. female G1P0000  GA 21w4dwho was referred to Social Work for support with mental health resources.    Data:     Patient Information  Referral Source: Medical Care Team  Referral Name: Kayla Frey, Kayla Frey Presenting Concerns: Mental health  Mode of Contact: Phone  Source of Information: Patient  Interpreter Needed?: No  Services/Teams: Obstetrics Gynecology (OB/GYN)    Assessment:     SW contacted pt and introduced role. Pt had time for brief call. Pt states she lives alone, hx of depression and anxiety, stopped Lexapro '30mg'$  and propanolol out of concern for her baby. She states that addiction runs in her family and does not want her baby weaned off of these medications upon delivery. She states she has been coping by accessing social support (identified mother and FOB as support) and listening to music, and may self-medicate with marijuana if needed. However, pt's goal is to find a therapist and not cause any harm to her baby and interested in connecting with a therapist. Pt states she is working with her medical providers in finding a safer alternative for lexapro and propanolol. She denies need for SA resources at this time.     SW provided empathic listening and supportive counseling. SW encouraged pt to continue to work with provider to find medication alternative. SW discussed MDayvilleservices available in the community. SW attempted if pt connected with Kayla referral. Pt states she has ADHD and gets overwhelmed, she reports ambivalence about connecting with too many services. SW took time to provide education on support and services offered by PHN and doulas. SW acknowledged and validated pt's feelings, and advised her to consider having doula support. SW agreed not to overwhelm pt with referral list and send only needed information. SW to send contact for [facility] and 2 therapists who accept Medi-Cal pts. Pt to contact  SW if additional information/support is needed.       Interventions/Plan:     Interventions Provided: Community referral, Mental Health, Orientation to Social Work services, PBarrister's clerkeducation, Pregnancy/parenthood counseling, Supportive counseling, Psychosocial Assessment  Community Referral: Mental health  Time Spent: 681min          Kayla Frey MSW  Clinical Social Worker II

## 2022-10-31 NOTE — Telephone Encounter (Signed)
Phoned pt re:  9 AM RD virtual visit.  Pt states she wishes to cancel and will cal to reschedule.  ME Eriyah Fernando, RD, IBCLC

## 2022-11-01 LAB — HEMOGLOBINOPATHY EVALUATION
Hemoglobin A2: 2.7 PERCENT (ref 2.2–3.2)
Hemoglobin A: 97.3 PERCENT (ref 95.9–97.8)
Hemoglobin F: <1 PERCENT (ref <1.0)
Hemoglobinopathy Eval: NORMAL

## 2022-11-01 LAB — RUBELLA ANTIBODY IGG: Rubella Antibody: 5.6 Index

## 2022-11-01 MED ORDER — FERROUS SULFATE 325 MG (65 MG IRON) TABLET
325 | ORAL_TABLET | ORAL | 1 refills | 84.00000 days | Status: AC
Start: 2022-11-01 — End: 2023-05-15

## 2022-11-01 MED ORDER — CHOLECALCIFEROL (VITAMIN D3) 25 MCG (1,000 UNIT) TABLET
1000 | ORAL_TABLET | Freq: Every day | ORAL | 3 refills | Status: DC
Start: 2022-11-01 — End: 2022-12-17

## 2022-11-03 NOTE — Telephone Encounter (Signed)
Called patient to reschedule missed video visit with [facility] the nutritionist, patient declined as she stated she prefers a provider of color who can understand her eating accommodations after having bariatric surgery.

## 2022-11-06 MED ORDER — ESCITALOPRAM 20 MG TABLET
20 | ORAL_TABLET | Freq: Every day | ORAL | 3 refills | 90.00000 days | Status: DC
Start: 2022-11-06 — End: 2023-05-15

## 2022-11-06 NOTE — Telephone Encounter (Signed)
Current pended order:  escitalopram oxalate (LEXAPRO) 20 mg tablet [Pharmacy Med Name: ESCITALOPRAM 20MG  TABLETS]  TAKE 1 AND 1/2 TABLET TABLET BY MOUTH EVERY MORNING. CRUSH BEFORE TAKING    Last time approved:  escitalopram oxalate (LEXAPRO) 20 mg tablet  Take 1.5 tablets (30 mg total) by mouth every morning for 360 days .crush tablet before taking for 3 months.  Dispense: 135 tablet Refill: 0  GEN MED MZ 1545 2 by Kary Kos ALCALAY on 08/11/22        Last office visit:    09/10/2021   Last video visit:     09/22/2022   Last scheduled telephone encounter:  Visit date not found    Next appointment:  12/15/2022 Elsie Amis, MD    Last  BP:  BP Readings from Last 1 Encounters:   10/21/22 136/69       Last Labs:    Lab Results   Component Value Date    Creatinine 0.65 10/29/2022    Sodium, Serum / Plasma 139 07/26/2021    Potassium, Serum / Plasma 4.1 07/26/2021    Hemoglobin A1c 4.8 10/29/2022    Cholesterol, Total 199 07/26/2021    LDL Cholesterol 133 (H) 07/26/2021    Cholesterol, HDL 53 07/26/2021    Thyroid Stimulating Hormone 1.15 01/02/2020    Hemoglobin 11.2 (L) 10/29/2022    eGFR - low estimate 90 10/24/2020    eGFR - high estimate 104 10/24/2020       If patient has not been seen in clinic for 12 months or more I have routed encounter to the administrative team to book a follow-up appointment.

## 2022-11-11 ENCOUNTER — Ambulatory Visit
Admit: 2022-11-11 | Payer: MEDICAID | Attending: Reproductive Endocrinology | Primary: Student in an Organized Health Care Education/Training Program

## 2022-11-11 ENCOUNTER — Inpatient Hospital Stay: Admit: 2022-11-11 | Payer: MEDICAID | Primary: Student in an Organized Health Care Education/Training Program

## 2022-11-11 DIAGNOSIS — Z3490 Encounter for supervision of normal pregnancy, unspecified, unspecified trimester: Secondary | ICD-10-CM

## 2022-11-11 NOTE — Procedures (Signed)
[place]  Department of [place]  [address] [place]  [address]  [phone]    November 11, 2022    [name], N.P.  [facility]  [address], [place] [address]    Nuchal Translucency / Ultrasound Report     Re: Kayla Frey (DOB: [birth year])  [facility] MRN: [id number]   INDICATION: nuchal translucency screening  TECHNIQUE: transabdominal and endovaginal    Dear [name]:    Kayla Frey, who is a 37 year old grav 1, para 0, was seen on November 11, 2022  for ultrasound and nuchal translucency due to: nuchal translucency screening.     An intrauterine gestational sac is identified with a single living fetus. The  CRL is 55.4 mm, consistent with a gestational age of [redacted] weeks 1 days and an EDD  of 05/25/2023. The uterus and cervix were unremarkable. No adnexal abnormalities  were identified, although the left ovary was not visualized. Transabdominally,  the cervix appeared long and closed.     Gestational age based on the patient's reported EDC of 05/25/2023 is 12 weeks 1  days.     The following fetal anatomic structures were evaluated to the extent it was  technically possible and appropriate for gestational age: head/skull, midline  falx, choroid plexus, nuchal region, profile and nasal bone, cardiac position  and axis, 4 chamber heart, fetal cord insertion/ventral wall, kidneys, spine,  stomach/situs, bladder, and extremities. No major structural abnormalities were  identified.     The nuchal translucency measurement was: 1.4 mm.     NIPT screening for trisomies 68, 18 and 21 is screen negative.     Please note, these tests do not screen for neural tube defects or abdominal  wall defects. We recommend MSAFP screening be performed between 15 and [redacted] weeks  gestation.     The patient is scheduled for a return visit on Wednesday, November 12, 2022 at  9:00 AM.    Sincerely  yours,    Hulan Saas, M.D.      Perinatal Medicine & Genetics        Performing sonographer: B. [facility]    Procedure MD credential number: [id number]    Electronic Signature: Hulan Saas, M.D.  Signed 11/11/2022 at 5:51 PM

## 2022-11-11 NOTE — Progress Notes (Signed)
Reproductive Genetics Progress Notes, Consult Letters and Interpretations are filed in Chart Review under the "Notes" tab. Reproductive Genetics Ultrasounds are filed under the "Imaging" tab.

## 2022-11-12 ENCOUNTER — Other Ambulatory Visit
Admit: 2022-11-12 | Payer: MEDICAID | Attending: MS" | Primary: Student in an Organized Health Care Education/Training Program

## 2022-11-12 DIAGNOSIS — O09511 Supervision of elderly primigravida, first trimester: Secondary | ICD-10-CM

## 2022-11-12 NOTE — Progress Notes (Signed)
[place]  Department of [place]  [address], [place]  [address]  [place], [phone]    November 12, 2022    Kayla Frey, N.P.  [facility]  [address] [place] [address]    Re: Kayla Frey (DOB: 07-10-1986)  [facility] MRN: [id number]   INDICATION: maternal age    Dear Ms. Laureen Abrahams:    Your patient, Kayla Frey (DOB: 05/22/1986) was seen in consultation per  your request at our [facility] office on November 12, 2022 for genetic  counseling due to: maternal age. Evanthia Cartelli is a 37 year old grav 1, para 0,  tab 0, sab 0, living children 0. Gestational age based on the patient's  reported EDC of 05/25/2023 is 12 weeks 2 days. I met with your patient in  consultation for 30 minutes.     I performed this consultation using real-time telehealth tools, including a  live video connection between my location and the patient's location. Prior to  initiating the consultation, I obtained the patient's informed verbal consent  to perform this consultation using telehealth tools and answered all the  questions the patient had about the telehealth interaction.    Pregnancy History: History was obtained about the current pregnancy and is  significant for the referring indication (maternal age).     The patient had negative cell free DNA screening through the [place] Prenatal  Screening Program. Her results indicate a low risk for Down syndrome, trisomy  22, and trisomy 13 in the current pregnancy. Screening for sex chromosome  aneuploidies has not been performed; I advised the patient to reach out to her  [place] [facility] provider if she is interested in this supplemental screening.    The patient had a nuchal translucency ultrasound through the [facility] on  11/11/2022 which was within normal limits.    Of note, the patient previously took Lexapro to help with her anxiety and  depression but  discontinued use of this medication after learning about her  pregnancy. The patient shared that she does not plan to use an SSRI for the  remainder of her pregnancy due to concerns about neonatal withdrawal symptoms  and her family history of addiction. The patient reports that she now smokes  marijuana to help with her mental health conditions. She reports that she takes  "a few puffs from a blunt or a few hits from a bong" about 1-2 times a day. Per  [employer], "recreational marijuana use has not been consistently shown to  increase the risk of congenital anomalies. A decrease in fetal growth or  gestational age was reported in some studies. Decreased attentiveness in  children and executive functioning in older children and adults have been  reported." The patient shared that she has disclosed this to her OB provider  and is in the process of working with her OB and social worker to identify  other mental health resources/support.     Family History: A family history was obtained and was significant for cancer,  learning disabilities, ADHD, autism, muscular dystrophy, mental illness,  stillbirths, and color blindness.     CANCER  The patient reports that her father died of colon cancer at age 58. He  completed a cancer genetic testing panel through [facility] and was initially  found to have a variant of uncertain significance in the  WRN gene. This was  later downgraded to likely benign in 2022. This genetic testing report was  available in the patient's chart for review. The patient's paternal uncle and  paternal grandfather also died of colon cancer. Additionally, the patient's  maternal grandmother had ovarian cancer. We discussed that a small proportion  of cancer is associated with inherited variants that predispose to specific  types of cancer. Multiple affected family members, multiple cancers in one  individual, and a young age at diagnosis are factors that raise concern for an  inherited cancer syndrome.  The patient had genetic counseling through the [facility] in 02/2022 to discuss this family history and her testing  options in more detail. The patient stated that she submitted a sample for  cancer genetic testing, but does not recall receiving results; I was unable to  locate any cancer genetic testing records for the patient in her chart and  advised the patient to follow up with her cancer genetic counselor/the [facility].     LEARNING DISABILITIES  The patient reports that she and her brother had learning disabilities and  required an IEP in school. The etiology of learning disabilities is often  multifactorial, including both genetic and environmental components. Without a  known cause, recurrence risks cannot be estimated.    ADHD  The patient reports that she and several of her relatives have diagnoses of  attention deficit hyperactivity disorder (ADHD). ADHD is a complex disorder  with significant heterogeneity in its etiology, presentation and treatment  outcome. ADHD is considered one of the most heritable psychiatric conditions,  but no single genetic risk factor has been identified. In the presence of a  family history of ADHD, there is likely an increased recurrence risk above  background population risk. Prenatal diagnosis or genetic predisposition  testing for ADHD are not available.     AUTISM  The patient reports that her paternal first cousin has three children (two boys  and one girl) who all have a diagnosis of autism. Recognized etiologies of  autism spectrum disorders range from well defined genetic abnormalities to  conditions of nongenetic origin. A genetic evaluation is available for  individuals with these conditions as approximately 5-10% may result from an  underlying chromosome abnormality or genetic syndrome. However, without a known  genetic etiology, recurrence risks cannot be estimated.    MUSCULAR DYSTROPHY  The patient reports that a different paternal  first cousin (female) has a form of  muscular dystrophy that was "inherited from his mother" (who is not  biologically related to the patient). If this is true, there would not be an  increased risk for recurrence for the patient's offspring. I would be happy to  review health records on the affected individual to confirm this inheritance.  During today's appointment, the patient stated that she does not have  additional information or records for this family history.     MENTAL ILLNESS  The patient reports personal diagnoses of anxiety and depression. She also  reports that several family members, including two of her brothers, her mother,  and her father, have mental health concerns and addiction issues. Most mental  health disorders are considered multifactorial; both genetic and environmental  factors contribute to the development of the condition. Recurrence risks are  often based on empiric risk figures. In the presence of a family history of  mental health conditions there is an increased risk of recurrence in relatives,  the  degree of risk generally declining as the degree of relationship increases.  When multiple family members are affected the recurrence risk generally  increases. While numerous studies have been performed to identify candidate  genes for personality disorders, there is little clinically applicable  information available. Genes that can be used for prenatal diagnosis or  predisposition testing are often not available.     STILLBIRTH  The patient reports that her parents had a stillbirth. She also reports that  her paternal first cousin (who has the three children with autism) experienced  two stillbirths. The patient does not know the cause of these stillbirths.  Stillbirth/infant death can have many different etiologies. A specific risk  assessment requires a known etiology which is not currently available. I  advised the patient to recontact [place] if she obtains additional  information  regarding this family history.     COLOR BLINDNESS  The patient reports that her partner is red-green color blind. Color blindness  occurs in different forms with varying etiologies. It can also be part of more  extensive retinal disease that may or may not be inherited. The most common  forms of color blindness, whether protan or deutan in type, are inherited in an  X-linked recessive manner and occur in about 8% of males. The rare total color  blindness (monochromatism) is autosomal recessive. A specific risk assessment  requires a known etiology, which is not currently available.     Carrier Screening: The patient had expanded carrier screening previously and  was not identified to be a carrier of any of the conditions on the panel. These  results were reviewed during today's visit. Negative carrier screening results  reduce, but cannot eliminate, the chance that someone is a carrier of the  tested conditions.     Genetic Counseling: The risk of a fetal chromosome anomaly at age 38 (age at  [place]) is 1:104.     PRENATAL SCREENING AND TESTING OPTIONS  The risks, benefits, and limitations of prenatal testing were discussed in  detail. The discussion included options of diagnostic testing (chorionic villus  sampling and amniocentesis) and screening (nuchal translucency, cell free DNA  screening, maternal serum AFP screening, and level II ultrasound). Microarray  analysis is available when diagnostic testing is performed. Chromosomal  microarray analysis is a test that can identify major chromosomal  abnormalities, as well as smaller chromosomal abnormalities that are too small  to be detected by conventional karyotype analysis. Studies have revealed that  in samples with a normal karyotype, microarray analysis identified clinically  significant deletions or duplications in [id number] of fetuses with an ultrasound  abnormality, and 1.7% of those with a normal ultrasound. Microarray analysis  can yield results of  uncertain significance.    The background risk of 3-4% for birth defects in every pregnancy was discussed.  The patient understands that no prenatal test can identify all birth defects.     PLAN:  The patient declined diagnostic testing.    We recommend offering the patient maternal serum AFP screening.    Sincerely yours,       Lillie Fragmin, M.S.  Genetic Counselor      Electronic Signature: Lillie Fragmin, M.S.  Signed 11/12/2022 at 10:48 AM

## 2022-11-12 NOTE — Progress Notes (Signed)
Reproductive Genetics Progress Notes, Consult Letters and Interpretations are filed in Chart Review under the "Notes" tab. Reproductive Genetics Ultrasounds are filed under the "Imaging" tab.

## 2022-11-21 ENCOUNTER — Ambulatory Visit
Admit: 2022-11-21 | Payer: MEDICAID | Attending: Family | Primary: Student in an Organized Health Care Education/Training Program

## 2022-11-21 DIAGNOSIS — Z3480 Encounter for supervision of other normal pregnancy, unspecified trimester: Secondary | ICD-10-CM

## 2022-11-21 NOTE — Progress Notes (Signed)
This is an independent service.   The available consultant for this service is Dorette Grate, MD.               FOLLOW UP OB NOTE  37 y.o. G1P0000 at [redacted]w[redacted]d.  Subjective:     Concerns:following up from ED visit yesterday at [facility] (not visible in [place] at time of today's visit)     Pain at upper mid abd started 2 d ago, then last night after eating, pain came on then very intense, called 911, to [facility], did [place], identified gall stones and confirmed pregnancy was okay.  Also found to have UTI.  Was prescribed Cephalaxin, plans to pick up today.  In the ED, recalls that her MD recommended wait til after pg for gall bladder surgery.  Feeling well today.  Denies upper or lower abd pain, VB, or dysuria    Has been tracking home BP, did not bring log today.  By recall, systolic has been usually 120-130 but occasionally elevated, highest to 163.  Diastolic has been usually 80's, highest 92.  Denies sx with elevated BP.  Is open to starting BP Rx if indicated.   Has not started ASA yet     Confirms took azithro to treat CT on the day it was Rx'd, over 4 wks ago.  She provided the Rx to her partner, assumes he took it.  They have had [facility] since treatment.      For h/o depression/anxiety, met with SW Jamas Lav 3/8, was referred to therapist options.  Since she stopped taking medication, has been feeling a little unfocused, more emotional.  Had been on lexapro, propranolol, but prefers to stay off Rx in pregnancy.  Good support from family.  May consider therapy.    -s/p LEEP 11/2021  -quit tobacco 2021 cold [place]  ROS: Denies vaginal bleeding, cramping, dysuria, abnormal vaginal discharge.      Taking PNV: Yes  Medications and allergies updated in [facility]    PSH:  Support: family, FOB    Objective:   Prenatal vitals:  BP: 121/64  Weight: (!) 144.7 kg (319 lb)  General: well appearing, NAD  Cardiac activity visualized    Assessment & Plan:   Supervision of Pregnancy of 37 y.o. G1P0000 at [redacted]w[redacted]d, S =D, with  adequate  support  and appropriate weight gain  2nd trimester genetic screen ordered.   2nd trimester sonogram discussed  Continue prenatal vitamins   Health ed X: 15 min: Reviewed:  total weight gain, healthy nutrition and regular activity/exercise   danger signs of the second trimester   common discomforts of pregnancy     Follow up: RTC in 1 week to review BP log by VV and discuss MSAFP    HTN -- recommend start ASA,   RTC in 1 week to review BP log by VV     Chlamydia, treated  Collected TOC today    Cholelithiasis, dx yesterday at outside hospital, asymptomatic today  Discussed preventive diet, reducing dietary fats.  Pt consents to RD referral, would prefer provider of color if available    S/p gastric bypass 2021  Discussed adding back bariatric PNV plus PNV if tolerable; due for labs B1/B12, will collect with next draw  Continue MFM follow-up        Jonna Coup, NP

## 2022-11-22 LAB — CHLAMYDIA TRACHOMATIS/NEISSERIA GONORRHOEAE RNA
CT RNA: NOT DETECTED
Comments: NOT DETECTED
GC RNA: NOT DETECTED

## 2022-11-26 NOTE — Telephone Encounter (Signed)
Called patient for blood pressure check x3. No answer. Mychart message them to see when is a better time.     Sepideh Moshfegh LVN

## 2022-12-12 ENCOUNTER — Telehealth
Admit: 2022-12-16 | Discharge: 2022-12-17 | Payer: MEDICAID | Attending: Physician | Primary: Student in an Organized Health Care Education/Training Program

## 2022-12-12 DIAGNOSIS — O0992 Supervision of high risk pregnancy, unspecified, second trimester: Secondary | ICD-10-CM

## 2022-12-12 MED ORDER — LANSOPRAZOLE 15 MG DELAYED RELEASE,DISINTEGRATING TABLET
15 | ORAL_TABLET | Freq: Every day | ORAL | 1 refills | Status: DC
Start: 2022-12-12 — End: 2022-12-19

## 2022-12-12 NOTE — Progress Notes (Cosign Needed Addendum)
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 Also gets hives and GI sxs w shellfish, iodine and morphine.   - consider allergy testing  - epi pen prn          Tobacco use Z72.0   09/15/2019 - Present    Overview Addendum 02/28/2020 10:40 PM by Cottie Banda, MD     H/o daily 1/2 pack per day tobacco use for ~ 6 years. Was instructed to stop smoking for two months prior to upcoming bariatric surgery. Tried nicotine patch but irritated skin and hesitant to use nicotine gum bc does not eat gelatin. Interested in oral agent to reduce cravings.Referred to [facility] and started on chantix. Last tobacco use 12/30/19 has now been > 40 days without tobacco use  - cont chantix  - follow up w [facility] tobacco cessation program  - provided with cessation counseling and resources          Family history of cancer Z80.9   09/15/2019 - Present    Overview Signed 09/15/2019  6:19 PM by Cottie Banda, MD     Extensive family h/o cancer including father who passed away from colon cancer (dx age 69s), PGF w CC dx in 29s, MGM w ovarian and uterine cancer  - Ensure up to date on all cancer  screening  - Assess is eligible for genetic cancer screening based on FH         Marijuana use F12.90   09/15/2019 - Present    Overview Addendum 10/27/2022  1:30 PM by York Cerise, NP     Daily marijuana use, smoked 3-4 times daily.  - Assess interest in quitting at next appt    10/21/22 using bong twice daily for mgmt of depression and anxiety sx         Depression F32.A   11/22/2012 - Present    RESOLVED: Biliary problems during pregnancy in second trimester O26.612   12/12/2022 - 12/12/2022        I spent a total of 75 minutes on this patient's care on the day of their visit excluding time spent related to any billed procedures. This time includes time spent with the patient as well as time spent documenting in the medical record, reviewing patient's records and tests, obtaining history, placing orders, communicating with other healthcare professionals, counseling the patient, family, or caregiver, and/or care coordination for the diagnoses above.    I performed this evaluation using real-time telehealth tools, including a live video Zoom connection between my location and the patient's location. Prior to initiating, the patient consented to perform this evaluation using telehealth tools.     Foye Deer, MS4

## 2022-12-12 NOTE — Addendum Note (Signed)
Addended byBlenda Bridegroom on: 12/17/2022 11:23 AM     Modules accepted: Orders

## 2022-12-12 NOTE — Patient Instructions (Addendum)
It was a pleasure to see you!     We discussed a lot including the following recommendations:     Bring your blood pressure cuff to clinic to make sure it is working well - we requested an appointment   Go to the lab for blood and urine tests - remember to get your second trimester form from the clinic first   Continue to monitor your blood pressure at home- review the information below with more details   Start Prevacid to decrease acid in your stomach, especially when you are taking aspirin - we will review with your bariatric surgery team that they feel comfortable with this  Start vitamin D supplements sent to your pharmacy   Nutrition consultation     It turns out you already had a genetic counseling consultation and expanded carrier screening, so unless you would want counseling to pursue an amniocentesis, I do not think you need another consultation for that.     One thing we did not discuss but is important to get up to date on your cervical cancer screening- can you confirm if you ever got repeat testing after your LEEP?     We will get you scheduled for more prenatal visits.     All the best,   Dr. Eugenie Birks  ________________    MONITORING YOUR BLOOD PRESSURE AT HOME    In pregnancy, it is important to keep a normal blood pressure for your health and for your developing baby.     There are a number of diagnoses which can cause high blood pressure in pregnancy, including:  - Chronic hypertension: High blood pressure that was present before you got pregnant, or found before 20 weeks of pregnancy  - Gestational hypertension: High blood pressure that started after 20 weeks of pregnancy without any other lab abnormalities or symptoms  - Preeclampsia: High blood pressure that started after 20 weeks of pregnancy, plus protein in your urine, or other lab abnormalities or symptoms  - Superimposed preeclampsia: When a patient with chronic hypertension develops preeclampsia  - Eclampsia: When a patient with high blood  pressures in pregnancy has a seizure    What numbers to record  Before you check your blood pressure, please make sure you:  - Do not have any caffeine (coffee, tea, soda, energy drinks, etc.) or tobacco for 30 minutes before checking  - Sit down and rest for at least 5 minutes before checking  - Have your arm with the blood pressure cuff at the level of your heart, be sitting with your feet on the floor, your legs uncrossed, and your back supported    Check and write down your blood pressure each day at around the same time.  Example:  110  (top number is called the systolic blood pressure)    70  (bottom number is called the diastolic blood pressure)    When to call us  (1) If your blood pressure reading is: LESS than 140 (on top) and LESS than 90 (on bottom)  - Your blood pressure is well-controlled  - No need to worry or do anything special    (2) If your blood pressure reading is: BETWEEN 140 & 159 (on top) or BETWEEN 90 & 105 (on bottom)  - Do not be alarmed, but your blood pressure needs some attention soon!  - Please call the OB Clinic at [phone] during the day or the [facility] OB Triage at [phone] during holidays, nights and weekends.  -  We will help guide you on next steps!    (3) If your blood pressure reading is: GREATER than 160 (on top) or GREATER than 106 (on bottom)  - You may have a dangerous situation with your blood pressure!  - Please call the OB Clinic at [phone] during the day or the [facility] OB Triage at [phone] during holidays, nights and weekends.   - You will likely need to come in to the [facility] to be seen in triage.    DANGER SIGNS  If pre-eclampsia is severe, women can have symptoms like headache or vision changes, abnormal kidney, lung, or liver function tests and other complications.   Call the [facility] Clinic at [phone] during the day, or the OB Triage at [phone] if the clinic is closed, if you have any of the following:  - Headache that doesn't go  away with Tylenol, eating, drinking fluids and rest.   - Visual changes including blurry vision, tunnel vision, loss of vision or seeing floaters/squiggly lines.  - Abdominal pain located in the upper part of your right abdomen (not contraction pain).     EMERGENCY SYMPTOMS - SEEK IMMEDIATE ATTENTION IF YOU HAVE ANY OF THE FOLLOWING:  - Localized weakness of one part of your body  - Trouble speaking or understanding  - Difficulty breathing  - Seizures  - Loss of consciousness    FOLLOWING YOUR BLOOD PRESSURE AFTER DELIVERY  - While high blood pressure due to pregnancy usually resolves after delivery, it might take 6 weeks to go back to normal (up to 12 weeks).   - So, we want to follow you closely after delivery, as it is possible for the elevated blood pressure could return or even worsen after delivery.   - We may schedule you for a return to [facility] clinic visit for a quick blood pressure check 7-10 days after your delivery.   - Please call your prenatal care clinic for a blood pressure check if you are not contacted by the clinic for this appointment.    - After delivery, while checking your blood pressure at home, please seek immediate attention if your blood pressure is above 160/110.    If you were not given one of the above diagnoses:   - You do not need to check your blood pressure when you get home with your baby unless you were otherwise instructed, or you are having a symptom listed above that concerns you.     If you were given one of the above diagnoses:   - You will be asked to check your blood pressure daily  - You may be asked to report them to your OB provider via MyChart  - If you are not on medications, please check your blood pressure once a day, as instructed  - If you are taking medications, check your blood pressure BEFORE you take your medication   -- If the reading is <120/<80, do not take the medication and check again later in the day  -- If the reading is >120/>80, take the medication as  instructed  -- We will help you come off of the medications, based on your readings and your check-ins with [place]   - Send a MyChart message to your OB provider, or call the [facility] at [phone] if you have non-urgent medication concerns  - Call the [facility] at [phone] or the OB Triage [phone] if you have a blood pressure over 160/110    YOUR SIX-WEEK  OB CHECK-UP  Your 6-week postpartum visit is important for your general health, to check on your recovery, and to be sure that your blood pressure is normal. If you were diagnosed with a hypertensive disorder of pregnancy and are interested in getting pregnant again, talk to your OB/GYN about ways to reduce the risk of getting preeclampsia with the next pregnancy.    LONG-TERM FOLLOW-UP  If you were diagnosed with a hypertensive disorder of pregnancy, you are at increased risk for heart disease in the future, so it is crucial to follow-up with your doctor regularly. Unfortunately, preeclampsia increases your risk for developing: chronic hypertension, heart attacks, stroke, blood blots, and kidney disease; all later in life and outside of being pregnant. Regular check-ups with your primary care provider may decrease your risk. Tell them you had a hypertensive disorder of pregnancy! Together you can discuss prevention strategies such as lifestyle changes (eating a balanced healthy diet, exercising regularly, limiting alcohol, avoiding smoking) or medications if needed to reduce your risk of heart disease and promote a long and healthy lifestyle.     If you do not already have a PCP, it is ideal that you get one to follow your health.  [facility]: [email or web address]; [email or web address]    What can you do to reduce your risk for high blood pressure after pregnancy?  - Stay physically active: exercise 150 minutes each week.  - Maintain a healthy weight.  - Eat a healthy diet high in vegetables and fruits and limit your  salt.  - Have your blood pressure checked regularly and manage high blood pressure.  - Take blood pressure medication exactly as directed.
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
## 2022-12-12 NOTE — Addendum Note (Signed)
Addended byBlenda Bridegroom on: 12/16/2022 11:35 AM     Modules accepted: Orders

## 2022-12-17 ENCOUNTER — Ambulatory Visit: Admit: 2022-12-17 | Payer: MEDICAID | Primary: Student in an Organized Health Care Education/Training Program

## 2022-12-17 DIAGNOSIS — O0992 Supervision of high risk pregnancy, unspecified, second trimester: Secondary | ICD-10-CM

## 2022-12-17 LAB — COMPLETE BLOOD COUNT
Hematocrit: 31.7 % — ABNORMAL LOW (ref 36.0–46.0)
Hemoglobin: 10.6 g/dL — ABNORMAL LOW (ref 12.0–15.5)
MCH: 31.1 pg (ref 26.0–34.0)
MCHC: 33.4 g/dL (ref 31.0–36.0)
MCV: 93 fL (ref 80–100)
MPV: 11.1 fL (ref 9.1–12.6)
Platelet Count: 275 10*9/L (ref 140–450)
RBC Count: 3.41 10*12/L — ABNORMAL LOW (ref 4.00–5.20)
RDW-CV: 12.9 % (ref 11.7–14.4)
WBC Count: 9.2 10*9/L (ref 3.4–10.0)

## 2022-12-17 LAB — SECOND TRIMESTER SCREEN

## 2022-12-17 LAB — PROTEIN, TOTAL, RANDOM URINE
Prot Concentration,UR: <7 mg/dL
Protein/Creat Ratio, Urine: UNDETERMINED mg/g crea (ref <150)

## 2022-12-17 LAB — CREATININE, RANDOM URINE: Creatinine, Random Urine: 94.99 mg/dL

## 2022-12-17 MED ORDER — CHOLECALCIFEROL (VITAMIN D3) 25 MCG (1,000 UNIT) TABLET
1000 | ORAL_TABLET | Freq: Every day | ORAL | 3 refills | Status: DC
Start: 2022-12-17 — End: 2022-12-26

## 2022-12-18 LAB — FOLATE: Folate, serum: 18.1 ng/mL (ref >4)

## 2022-12-18 LAB — FERRITIN: Ferritin, Serum/Plasma: 44 ug/L (ref 12–160)

## 2022-12-18 LAB — B-TYPE NATRIURETIC PEPTIDE: BNP: 103 pg/mL — ABNORMAL HIGH (ref <48)

## 2022-12-18 LAB — VITAMIN D, 25-HYDROXY: Vitamin D, 25-Hydroxy: 18 ng/mL — ABNORMAL LOW (ref 20–50)

## 2022-12-18 LAB — SYPHILIS SCREEN BY RPR WITH REFLEX TO TREPONEMAL ANTIBODY: RPR: NONREACTIVE

## 2022-12-18 LAB — THYROID STIMULATING HORMONE: Thyroid Stimulating Hormone: 0.85 mIU/L (ref 0.45–4.12)

## 2022-12-19 LAB — VITAMIN B12: Vitamin B12: 272 ng/L — ABNORMAL LOW (ref 301–816)

## 2022-12-19 MED ORDER — LANSOPRAZOLE 30 MG CAPSULE,DELAYED RELEASE
30 mg | ORAL_CAPSULE | Freq: Every day | ORAL | 5 refills | Status: DC
Start: 2022-12-19 — End: 2023-05-15

## 2022-12-19 MED ORDER — LANSOPRAZOLE 15 MG CAPSULE,DELAYED RELEASE
15 | ORAL_CAPSULE | Freq: Every day | ORAL | 5 refills | Status: DC
Start: 2022-12-19 — End: 2022-12-19

## 2022-12-19 MED ORDER — CYANOCOBALAMIN (VIT B-12) 1,000 MCG TABLET
1000 | ORAL_TABLET | Freq: Every day | ORAL | 11 refills | 37.50000 days | Status: DC
Start: 2022-12-19 — End: 2023-08-14

## 2022-12-22 NOTE — Telephone Encounter (Signed)
Pt is currently scheduled to see PCP on 01/16/23, please advise.

## 2022-12-23 LAB — THIAMINE PYROPHOSPHATE: Thiamine Pyrophosphate: 96 nmol/L (ref 70–180)

## 2022-12-25 ENCOUNTER — Other Ambulatory Visit
Admit: 2023-01-02 | Payer: MEDICAID | Attending: Registered Dietitian | Primary: Student in an Organized Health Care Education/Training Program

## 2022-12-25 DIAGNOSIS — Z008 Encounter for other general examination: Secondary | ICD-10-CM

## 2022-12-25 NOTE — Progress Notes (Signed)
I performed this consultation using real-time Telehealth tools, including a live video connection between my location and the patient's location. Prior to initiating the consultation, I obtained informed verbal consent to perform this consultation using Telehealth tools and answered all the questions about the Telehealth interaction.     DOS: 12/25/22     NUTRITION SERVICES ASSESSMENT    Patient History:  Kayla Frey is a 37 y.o. 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    Anthropometric Data and Assessment:  Wt Readings from Last 3 Encounters:   12/25/22 (!) 146.1 kg (322 lb)   11/21/22 (!) 144.7 kg (319 lb)   10/21/22 (!) 143.4 kg (316 lb 3.2 oz)     Ht Readings from Last 1 Encounters:   10/21/22 170.2 cm (5\' 7" )     BMI Readings from Last 1 Encounters:   12/25/22 50.43 kg/m     Pre-conception weight: 141.5 kg (312 lb)   Pre-pregnancy BMI: 48.85 kg/m  Gestational weight gain: 4.536 kg (10 lb)  Expected weight changes: Goal of maintaining weight as feasible for the remainder of pregnancy     Assessment of weight change: Exceeding weight gain goals  Weight Assessment Summary: Upon review of weight history, overall weight gain has been more rapid than anticipated for 18 weeks of pregnancy. To maintain overall weight gain within a healthy range, recommend aiming for weight maintenance, as feasible for patient.    Nutrition-focused physical findings:   GI:   Denies GI complaints, Constipation    Food and Nutrition Intake:   Oral Intake Details: Patient notes increased appetite since entering the second trimester, noticing she has been eating more frequently than usual. No reported food aversions or intolerances, and her diet remains consistent with pre-pregnancy pattern. Pt opts for small, protein-rich meals throughout the day, incorporating foods like eggs, ground [place],  yogurt, cottage cheese, and lean beef links. She also enjoys a variety of fruits and vegetables. Despite eating regularly, she reports experiencing hunger shortly after meals, describing it as "hunger pains."     Vitamins, Minerals, Herbal Supplements  PNV as rx'd, iron 65 mg q other day, vit B12 2000 mcg, has to buy vit D supplement due to recent low level-- food sources of calcium: 2% milk, cheese, yogurt -- DHA/iodine: salmon (1x/week)     Food Access  WIC, recently approved     Knowledge, Beliefs, Attitudes  Food and Nutrition Knowledge: Basic nutritional knowledge  Readiness to Change Nutrition-Related Behavior Stage: Preparation stage     Physical Activity and Function  Physical activity details: not discussed in detail today    Assessment of Intake: Adequate energy intake (greater than 75% estimated needs), Adequate protein intake (greater than 75% estimated needs)  Micronutrient Intake: Sub-optimal (comment) (Will obtain additional vitamin D)    Significant Labs:    Lab Results   Component Value Date    Hemoglobin A1c 4.8 10/29/2022     Lab Results   Component Value Date    Vitamin D, 25-Hydroxy 18 (L) 12/17/2022    Ferritin, Serum/Plasma 44 12/17/2022    Iron, serum 86 10/29/2022    Transferrin, Serum/Plasma 214 10/29/2022    % Saturation 29 10/29/2022    Vitamin B12 272 (L) 12/17/2022    Folate, serum 18.1 12/17/2022    Thiamine Pyrophosphate 96 12/17/2022    Zinc, plasma 62 10/24/2020    Copper, serum/plasma  128 07/26/2021     Glucose Loading Screen   Date Value Ref Range Status   10/29/2022 117 70 - 129 mg/dL Final     Lab Results   Component Value Date    WBC Count 9.2 12/17/2022    Hemoglobin 10.6 (L) 12/17/2022    Hematocrit 31.7 (L) 12/17/2022    MCV 93 12/17/2022    Platelet Count 275 12/17/2022       Significant Meds:  Current Outpatient Medications   Medication Instructions    aspirin 162 mg, Oral, Daily At Bedtime Scheduled    biotin 10,000 mcg TABRAPID 1 tablet    blood pressure monitor KIT  Monitor blood pressure weekly in setting of preparing for pregnancy    calcium carbonate (CALCIUM 600 ORAL) 600 mg, Oral, Pt reports to be consuming 2 tablets/ day for a total of 1200 mg    cholecalciferol (vitamin D3) 1,000 Units, Oral, Daily Scheduled    cyanocobalamin (Vitamin B12) 2,000 mcg, Oral, Daily Scheduled    EPINEPHrine (EPIPEN) 0.3 mg, Intramuscular, Once PRN, Use as instructed    escitalopram oxalate (LEXAPRO) 30 mg, Oral, Daily Scheduled    ferrous sulfate 325 mg, Oral, Every Other Day    lansoprazole (PREVACID) 30 mg, Oral, Daily Scheduled    prenatal vitamin-iron fumarate-folic acid 28 mg iron- 800 mcg tablet 1 tablet, Oral, Daily Scheduled    valACYclovir (VALTREX) 500 mg, Oral, Daily Scheduled, For suppression.     Nutrition diagnosis:     Altered GI function   related to history of sleeve gastrectomy as evidenced by need for increased protein and micronutrients.. Active  Increased nutrient needs (protein, vitamins, minerals)     related to pregnancy, history of bariatric surgery (gastrectomy) as evidenced by need for added sources of carbohydrates and adjustment of micronutrient supplementation  for more optimal intake in pregnancy.. Active.         Nutrition Intervention:  Meal and Nutrient Recommendations:  Overall meal pattern: General, healthful diet  Increased protein intake (Continue incorportaing high quality proteins w/ every small meal)  Consistent carbohydrate intake, Increased carbohydrate intake (Suggest incorporating low-glycemic carbohydrate sources, as persistent hunger could stem from insufficient consumption of CHOs.)  Increased docosahexaenoic acid (DHA) intake, Increased fat intake (Incorportate sources of healthy fats, such as avocado, nuts, seeds, olives. Discussed benefits of pairing healthy fats w/ CHOs for blood glucose stability.)  Discussed weight goals for the remainder of this pregnancy. Recommend weight maintenance as feasible. See assessment for more  details.  Dietary sources of calcium, choline, DHA, iodine  Limit fruits to 1 serving at a time (1/2 whole fruit or 1 cup) and pair with a protein or healthy fat for better blood glucose stability.    Meal Schedule and Feeding Assistance:   Small frequent meals, Snacks between meals, Bedtime snack (Discussed optimal food balance for bedtime snack to promote stable blood glucose throughout the night)    Vitamins/Minerals:   Recommend vitamin supplement therapy, Recommend mineral supplement therapy, Recommend micronutrient lab check  Vitamin Supplement Recommendations: Prenatal vitamin, Vitamin B12, Vitamin D (Rec'd minimum 2000 units/day of vitamin D)  Mineral supplement Recommendations: Iron  Recommend micronutrient lab check : Other (comment)    Nutrition Education:   Education on nutrition's influence on health, Physical activity guidance, Nutrition related skill education, Nutrition-related lab result interpretation education (Nutrition for a healthy pregnancy)       Nutrition Counseling:   Strategies: Motivational interviewing     Coordination of Care:   Collaboration with other  interdisciplinary team member(s), Referral to community agencies and programs (Awaiting enrollment in CPSP program. Has WIC in place.)     Nutrition Monitoring/Evaluation:  Nutrition Goal Indicator: Macronutrient intake, Micronutrient intake, Food intake, Weight  Nutrition Goal Criteria: Meet nutrient goals from a balanced diet, ensuring protein goals are met; maintaining healthy wt in pregnancy  Nutrition Goal Progress: Some progress toward goal  Nutrition Goal 2 Indicator: Micronutrient intake, Food intake, Food and nutrition knowledge  Nutrition Goal 2 Criteria: Meet micronutrient goals from diet and oral supplementation  Nutrition Goal 2 Progress: Some progress toward goal    Elizbeth Squires MAS RD CSP IBCLC  Clinical Registered Dietitian  [facility] at [facility]  Phone: [phone]  E-mail: [email or web address]

## 2022-12-26 MED ORDER — CHOLECALCIFEROL (VITAMIN D3) 25 MCG (1,000 UNIT) TABLET
1000 UNITS | ORAL_TABLET | Freq: Every day | ORAL | 1 refills | Status: DC
Start: 2022-12-26 — End: 2023-08-14

## 2022-12-26 NOTE — Progress Notes (Signed)
Consulted with Blenda Bridegroom, MD.  Recommends increase Vit D to 2000 international units daily.  Rx updated, messaged patient.

## 2023-01-01 NOTE — Telephone Encounter (Signed)
Population Health Peripartum Remote Blood Pressure Management Program     PATIENT OUTREACH  A voicemail was left, and a phone call was made to notify the patient of the invitation to our Peripartum Remote Blood Pressure Management Program with a request for a callback.     Attempt #1: LVM requesting call back. Will attempt again.       FOLLOW-UP  [x] Sent MyChart message to patient  [x] Awaiting the patient's callback to confirm participation in OB RPM Program  [x] Awaiting response for Interest in OB Peripartum Remote BP Program

## 2023-01-01 NOTE — Telephone Encounter (Signed)
This case is being closed because the patient declined to participate in the program.    Interventions:  Pt responded to outreach and stated and confirmed that she has a Blood Pressure at home  that was given by clinic and does not feel like she need the extra support right now. Suggested for our team to outreach at a later time for enrollment.       Kelby Aline  [place] [facility] [employer]  [phone]

## 2023-01-06 ENCOUNTER — Inpatient Hospital Stay
Admit: 2023-01-06 | Discharge: 2023-01-06 | Payer: MEDICAID | Primary: Student in an Organized Health Care Education/Training Program

## 2023-01-06 DIAGNOSIS — R002 Palpitations: Secondary | ICD-10-CM

## 2023-01-06 LAB — ECG 12-LEAD
Atrial Rate: 62 {beats}/min
Calculated P Axis: 49 degrees
Calculated R Axis: 46 degrees
Calculated T Axis: 39 degrees
P-R Interval: 160 ms
QRS Duration: 84 ms
QT Interval: 396 ms
QTcb: 401 ms
Ventricular Rate: 62 {beats}/min

## 2023-01-06 LAB — GLUCOSE, FASTING: Glucose, fasting: 79 mg/dL (ref 70–99)

## 2023-01-08 NOTE — Telephone Encounter (Signed)
Pt reports she has stopped wearing cardiac event monitor due to adhesive reaction     36yo G1P0 at [redacted]w[redacted]d  Was seen for MFM  for  consult due to:  BMI >40 s/p gastric sleeve in 2021, cHTN, CINII s/p LEEP in 11/2021, h/o STI, family history of colon and ovarian cancer, and anxiety and depression in setting of h/o trauma, using marijuana, tachycardia    Dr. Eugenie Birks noted elevated BNP  and ordered (MyChart msg 12/19/22):  -EKG --> done on 01/06/23 normal rhythm  -TTE --> done on 01/06/23    -cardiac event monitor --> placed on 5/14 and pt unable to continue. Pt states they used a special adhesive for those w/ sensitivities.  -Referral placed to PACT --> reviewed and will be scheduled to see cardiology to evaluate tachycardia.     Pt received cardiac event monitor on 5/14 and removed it on same day.   Adhesive on center of chest.   At first, device said poor skin connection, she tried to reattach and skin was slightly red and had welt. Wore another adhesive x an hours then removed. Notes reaction "like a chemical burn, like I went into the sun"   was red. Is now scabbing over.     Doesn't think she had reaction to the EKG monitor  She called the number on the rhythm monitor but they said to call her doctor.     Plan:  -pt has stopped rhythm monitor use and will return. Adhesive allergy added to allergy list  -keep skin clean, dry, wear loosely fitted cotton or other non irritating fabric  -RN msg to cardiology team to notify she is done w/ tests. Their plan was to sched cards appt after testing completed  -msg to ordering MFM to update.   -has pending MFM appt 01/23/23. Pt confirms  -to OB/ED for any chest pain, shortness of breath, distress.   Pt agrees with plan. Denies s/s now.

## 2023-01-08 NOTE — Telephone Encounter (Signed)
CALL REASON:  [redacted]w[redacted]d, pt had adult echo on 05/14 and was given a heart monitor to wear for 30days.  She is allergic to Adhesive and go a reaction (burn).     She has stopped wearing due to that issue.    BEST # TO CALLBACK: [phone] (home)

## 2023-01-12 ENCOUNTER — Inpatient Hospital Stay: Admit: 2023-01-12 | Payer: MEDICAID | Primary: Student in an Organized Health Care Education/Training Program

## 2023-01-12 ENCOUNTER — Ambulatory Visit
Admit: 2023-01-12 | Payer: MEDICAID | Attending: Physician | Primary: Student in an Organized Health Care Education/Training Program

## 2023-01-12 DIAGNOSIS — O169 Unspecified maternal hypertension, unspecified trimester: Secondary | ICD-10-CM

## 2023-01-12 DIAGNOSIS — O09522 Supervision of elderly multigravida, second trimester: Secondary | ICD-10-CM

## 2023-01-12 DIAGNOSIS — O09512 Supervision of elderly primigravida, second trimester: Secondary | ICD-10-CM

## 2023-01-12 NOTE — Patient Instructions (Signed)
Your ultrasound report will be available on MyChart within 48 hours.     For more information on caring for yourself during pregnancy, please visit our electronic Women's Health Pregnancy Guidebook at [email or web address].

## 2023-01-12 NOTE — Procedures (Signed)
[facility]  Department of OB/GYN & Reproductive Sciences  [facility]  [address]  [place], [place]  [address]  [phone]    Jan 12, 2023    York Cerise, NP  [address] [facility]  [place], [place]   Ultrasound Report     Re: Kayla Frey (DOB: 09-Apr-1986)  [facility] MRN: [id number]   INDICATION: maternal age and screening for malformations  TECHNIQUE: transabdominal     Dear York Cerise:    [name], who is a 37 year old grav 1, para 0, was seen on Jan 12, 2023 for  ultrasound due to: maternal age and screening for malformations.     There is a single living fetus in vertex presentation. The placenta is  posterior, and has no evidence of previa. The placental cord insertion is  normal. The amniotic fluid volume is normal. The uterus was unremarkable. No  adnexal abnormalities were identified, although neither ovary was visualized.  The cervix measured 4.0 cm.     The following measurements were obtained:      BPD    47 mm,    20 weeks    1 days      HC    183 mm,    20 weeks    5 days      AC    160 mm,    21 weeks    1 days      FL    36 mm,    21 weeks    3 days  Gestational age based on biometry is 20 weeks 6 days with an EDD of 05/26/2023.  The EFW is 404 grams.     Gestational age based on the patient's reported EDC of 05/25/2023 is 21 weeks 0  days.     This examination included a survey of the fetal anatomy. The survey is  comprised of (but not limited to) the following views where technically  possible and clinically appropriate:   Brain - parenchyma, cerebral ventricles (including 4th ventricle), cerebellum,  choroid plexus, cisterna magna, midline falx, cavum septi pellucidi, and nuchal  thickness  Spine - cervical, thoracic, lumbar and sacral spine, shape and curvature  Face - orbits including lens, fetal upper lip, maxilla, mandible, nose, nasal  bone, profile, and neck  Heart - situs, four-chamber image of the heart, great vessels (and/or  outflow  tracts), 3 vessel view, superior and inferior vena cava and aortic arch.  Abdomen - diaphragm, kidneys, gallbladder, liver, stomach (left-sided), urinary  bladder, ventral abdominal wall, umbilical cord insertion into the fetal  abdomen, and umbilical cord vessel number.  Extremities - legs, arms, hands and feet.  No major structural abnormalities were identified, although visualization was  limited by maternal body habitus and fetal position.     Impressions: The fetus is appropriately grown for gestational age with normal  amniotic fluid volume. A detailed anatomical survey was performed and is within  normal limits.     NIPT screening for trisomies 43, 18 and 21 is screen negative. MSAFP screening  through the [facility] Prenatal Screening Program is screen negative.     A follow up ultrasound for growth is recommended at 32 weeks.    Sincerely yours,    Burna Forts, M.D.  Perinatal Medicine & Genetics    Electronic Signature: Burna Forts, M.D.  Signed 01/12/2023 at 4:53 PM

## 2023-01-12 NOTE — Progress Notes (Signed)
37 y.o. person, G1P0000, at [redacted]w[redacted]d weeks. Reproductive Genetics Progress Notes, Genetic Consult Letters and Interpretations are filed in Chart Review under the "Notes" tab. Reproductive Genetics Ultrasounds (and MFM consultations from same date) are filed under the "Imaging" tab.

## 2023-01-13 ENCOUNTER — Telehealth
Admit: 2023-01-14 | Payer: MEDICAID | Attending: Cardiovascular Disease | Primary: Student in an Organized Health Care Education/Training Program

## 2023-01-13 DIAGNOSIS — I517 Cardiomegaly: Secondary | ICD-10-CM

## 2023-01-13 NOTE — Progress Notes (Signed)
I performed this evaluation using real-time telehealth tools, including a live video Zoom connection between my location and the patient's location. Prior to initiating, the patient consented to perform this evaluation using telehealth tools.             Subjective    Kayla Frey is a 37 y.o. female who presents with the following:    Chief Complaint              Abnormal Echo                 History of Present Illness   37 year old female with :-  - BMI >40   - Gastric sleeve in 2021 480lbs down to 302pounds  - Fluctuating BP " doesn't know' - never on medications.   - BP better after gastric sleeve  - [redacted] weeks GA   - Father MI X 2  , 1st Cousin PPCM   - Anxiety On lexapro now off it after she was pregnant     Kayla Frey comes in today for consultation regarding abnormal echo findings. She has a hx of Gastric Sleeve and by notes hypertension that improved after the sleeve however she is not aware of any high numbers and states that she has never been in medications. She does have palpitations both before and after conceiving. No presyncope or syncope and she chalks this up to anxiety that she states that sh eis suffering from especially now that she is off the lexapro as she was told she shouldn't be on it during pregnancy. No  chest pain, shortness of breath, paroxysmal nocturnal dyspnea, orthopnea,  syncope, or presyncope. FH as above     Allergies/Contraindications   Allergen Reactions    Adhesive Rash     Gets chemical burn like reaction, redness. Cannot use "plastic" type adhesive. Ok for 'cotton' bandages.     Iodine Hives and Itching     IV Iodine.  Unsure about topical iodine    Shellfish Derived Anaphylaxis    Tree Nuts Anaphylaxis    Morphine Nausea And Vomiting     Outpatient Encounter Medications as of 01/13/2023   Medication Sig Dispense Refill    aspirin 81 mg EC tablet Take 2 tablets (162 mg total) by mouth nightly at bedtime 60 tablet 6    calcium carbonate (CALCIUM 600 ORAL) Take 600 mg by mouth Pt  reports to be consuming 2 tablets/ day for a total of 1200 mg      cholecalciferol, vitamin D3, 1000 UNITS tablet Take 2 tablets (2,000 Units total) by mouth daily 180 tablet 1    cyanocobalamin, Vitamin B12, 1,000 mcg tablet Take 2 tablets (2,000 mcg total) by mouth daily 90 tablet 11    ferrous sulfate 325 mg (65 mg elemental) tablet Take 1 tablet (325 mg total) by mouth every other day 30 tablet 1    lansoprazole (PREVACID) 30 mg capsule Take 1 capsule (30 mg total) by mouth daily 30 capsule 5    multivitamin-min-iron-FA-vit K (PROCARE BARIATRIC) 45 mg iron- 800 mcg-120 mcg capsule Take 1 capsule by mouth nightly at bedtime 90 capsule 4    prenatal vitamin-iron fumarate-folic acid 28 mg iron- 800 mcg tablet Take 1 tablet by mouth daily 100 tablet 3    biotin 10,000 mcg TABRAPID Take 1 tablet by mouth      blood pressure monitor KIT Monitor blood pressure weekly in setting of preparing for pregnancy 1 kit 0    EPINEPHrine (EPIPEN) 0.3  mg/0.3 mL injection Inject 0.3 mL (0.3 mg total) into the muscle once as needed for Anaphylaxis for up to 1 dose Use as instructed (Patient not taking: Reported on 10/21/2022) 1 each 6    escitalopram oxalate (LEXAPRO) 20 mg tablet Take 1.5 tablets (30 mg total) by mouth daily 135 tablet 3    valACYclovir (VALTREX) 500 mg tablet Take 1 tablet (500 mg total) by mouth daily For suppression. (Patient not taking: Reported on 10/21/2022) 90 tablet 3     No facility-administered encounter medications on file as of 01/13/2023.     Past Medical History:   Diagnosis Date    Abnormal Pap smear of cervix     s/p LEEP    Acne 11/23/2019    New to me. Mild facial acne. Previously on topical retinoid but no longer taking it.       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    Vitamin D deficiency        Past Surgical History:   Procedure Laterality Date    BARIATRIC SURGERY      LEEP       Family History   Problem Relation Name Age of Onset    Mental health problem Mother Mom         unknown type    Drug abuse Mother Mom         crack cocaine    Ovarian cysts Mother Mom     Cancer - colon Father Dad 52        d.58, liver failure, worked on planes for the [employer] blood pressure Father Dad     Drug abuse Father Dad         crack cocaine    Colon cancer Father Dad     Heart disease Father Dad     Depression Brother Brother     Anxiety disorder Brother Brother     Mental health problem Brother Brother         possible bipolar disorder    Alcoholism Brother Brother     Asthma Brother Brother     Suicidality Brother Brother         attempted suicide    Early death Paternal Aunt Annabelle Harman     Obesity Paternal Caryn Bee     Colon cancer Paternal Uncle archie 2    Cancer - lung Maternal Grandmother Alvino Chapel ann         in 50s-60s, had pneumonia    Alcoholism Maternal Grandmother Alvino Chapel ann     Cancer Maternal Grandmother Marvia Pickles     Mental health problem Maternal Grandmother Alvino Chapel ann         unknown type    Cervical cancer Maternal Grandmother Alvino Chapel ann  unknown age    Colon cancer Paternal Grandfather Jari Favre 72        Dealer    Miscarriages / [place] Cousin      Eclampsia Cousin      Cervical cancer Cousin          from HPV    Cervical cancer Maternal Great-Grandmother      Anesth problems Neg Hx         Social History     Tobacco Use    Smoking status: Former     Current packs/day: 0.00     Types: Cigarettes     Start date: 09/14/2014     Quit date: 12/30/2019     Years since  quitting: 3.0    Smokeless tobacco: Never    Tobacco comments:     taking chantix to help with smoking cessation   Substance and Sexual Activity    Alcohol use: Not Currently     Comment: has used socially in past; no history of problem use    Drug use: Never     Comment: no history of Rx med abuse or illicit substance use    Sexual activity: Yes     Partners: Male     Birth control/protection: None     Comment: Pt denies pregnancy.  No activity for at least 1 mont           Objective      Patient Entered Vitals      Flowsheet Row Most Recent Value   Systolic Blood Pressure 124 mmhg (P)  filed at 01/12/2023 0753   Diastolic Blood Pressure 76 mmhg (P)  filed at 01/12/2023 0753   Pulse 59 bpm (P)  filed at 01/12/2023 0753   Weight (lbs) 315 (P)  filed at 01/12/2023 0753   Weight (kg) 143.18 (P)  filed at 01/12/2023 0960            Physical Exam  GENERAL: Patient appears well-developed and well-nourished. Pleasant and appropriately interactive.     EYES: Normal conjunctivae, no scleral icterus. Normal EOM.   NECK: Normal range of motion.  RESPIRATORY: Effort normal. No respiratory distress. No cough.    CARDIOVASCULAR: JVP not visualized.   GI/ABDOMEN: No abdominal distension.  EXTREMITIES: No edema.   SKIN: Normal Color. No clubbing or cyanosis; No Rash.    NEUROLOGIC: Able to stand from sitting and walk. Normal gait.  PSYCHIATRIC: Alert and oriented to person, place and time. Normal mood and affect.     Review of Prior Testing  DATA:  I have personally reviewed the following data:     ECG today: normal sinus rhythm.  I have independently reviewed and interpreted this ECG and echo and remeasured nos    Echo 01-06-2023  1. The left ventricular volume is normal. LV function is normal. 2D LV ejection fraction is estimated to be 65 to 70%. There is mild left ventricular hypertrophy. The pattern of left ventricular hypertrophy is concentric. No segmental wall motion   abnormalities present.  2. The right ventricular volume  is normal. Right ventricular function is normal.  3. Left atrial size is normal. Right atrial size is normal.  4. There is no hemodynamically significant valvular disease.  5. There is no Doppler evidence of LV diastolic dysfunction.  6. The RV systolic pressure is at least 28 mmHg based on a right atrial pressure of 3 mmHg.  7. No pericardial effusion noted. The inferior vena cava is less than 21 mm  in diameter and collapses with inspiration consistent with a right atrial pressure of 3 mmHg.  8. Aortic root dimension is normal.                  Previous Comparison:  No previous study is available for comparison.     Assessment and Plan       Jamiracle Hausner is a 37 y.o. female .   - BMI >40   - Gastric sleeve in 2021 480lbs down to 302pounds  - Fluctuating BP " doesn't know' - never on medications.   - BP better after gastric sleeve  - [redacted] weeks GA   - Father MI X 2  , 1st Cousin PPCM   - Anxiety On lexapro now off it after she was pregnant     NO Left ventricular hypertrophy   LVH is notorious over measured on echoes. I went back and measured the walls and it is ~0.9 cm both IVSd and PWT . The measurements on short axis for mass calculations was also over measured. Her BP is normal now and she is not on any medications. No further work up required at this time.     Palpitations/Tachycardia  Structurally normal heart and HR goes up by about 10-20 points during pregnancy. Furthermore it is likely exacerbated by her weight prepregnancy was 302 lbs . And she states that her symptoms are most likely anxiety as they were present even before conceiving and now exacerbated as she is off lexapro    Medical problems complicating pregnancy  She is on aspirin 162mg  for prevention of pre term preeclampsia . She was advised that if she has any adverse pregnancy outcomes we should see her in 6 weeks in cardiology if not 18 months for screening of lipids and primary prevention assessment with either PMD or [place]  Expectant Mx - No  specialty considerations from a cardiac standpoint.     Thank you for this very interesting referral            I reviewed external records from 1 providers outside my specialty or healthcare organization as summarized in the note. I personally reviewed and interpreted a test as summarized in the note.    I spent a total of 54 minutes on this patient's care on the day of their visit excluding time spent related to any billed procedures. This time includes time spent with the patient as well as time spent documenting in the medical record, reviewing patient's records and tests, obtaining history, placing orders, communicating with other healthcare professionals, counseling the patient, family, or caregiver, and/or care coordination for the diagnoses above.         I have provided the following written instructions to the patient in the After Visit Summary:   Patient Instructions   It wa sa pleasure to meet you  If you have any adverse pregnancy outcomes I would like for you to see [place] in 6 weeks post partum or you can follow with PMD o cardiology 18 months for screening

## 2023-01-13 NOTE — Patient Instructions (Signed)
It wa sa pleasure to meet you  If you have any adverse pregnancy outcomes I would like for you to see [place] in 6 weeks post partum or you can follow with PMD o cardiology 18 months for screening

## 2023-01-15 ENCOUNTER — Telehealth: Admit: 2023-01-15 | Payer: MEDICAID | Primary: Student in an Organized Health Care Education/Training Program

## 2023-01-15 DIAGNOSIS — Z3492 Encounter for supervision of normal pregnancy, unspecified, second trimester: Secondary | ICD-10-CM

## 2023-01-23 ENCOUNTER — Ambulatory Visit
Admit: 2023-01-23 | Payer: MEDICAID | Attending: Physician | Primary: Student in an Organized Health Care Education/Training Program

## 2023-01-23 ENCOUNTER — Ambulatory Visit: Admit: 2023-01-23 | Payer: MEDICAID | Primary: Student in an Organized Health Care Education/Training Program

## 2023-01-23 DIAGNOSIS — O169 Unspecified maternal hypertension, unspecified trimester: Secondary | ICD-10-CM

## 2023-01-23 DIAGNOSIS — O0990 Supervision of high risk pregnancy, unspecified, unspecified trimester: Secondary | ICD-10-CM

## 2023-01-23 LAB — CREATININE, SERUM / PLASMA
Creatinine: 0.66 mg/dL (ref 0.55–1.02)
eGFRcr: 117 mL/min/{1.73_m2} (ref >59)

## 2023-01-23 LAB — ALANINE TRANSAMINASE: Alanine transaminase: 10 U/L (ref 10–61)

## 2023-01-23 LAB — PROTEIN, TOTAL, RANDOM URINE
Prot Concentration,UR: 17 mg/dL
Protein/Creat Ratio, Urine: 59 mg/g crea (ref <150)

## 2023-01-23 LAB — CREATININE, RANDOM URINE: Creatinine, Random Urine: 289.55 mg/dL

## 2023-01-23 LAB — COMPLETE BLOOD COUNT
Hematocrit: 30.1 % — ABNORMAL LOW (ref 36.0–46.0)
Hemoglobin: 10.3 g/dL — ABNORMAL LOW (ref 12.0–15.5)
MCH: 31.2 pg (ref 26.0–34.0)
MCHC: 34.2 g/dL (ref 31.0–36.0)
MCV: 91 fL (ref 80–100)
MPV: 10.6 fL (ref 9.1–12.6)
Platelet Count: 259 10*9/L (ref 140–450)
RBC Count: 3.3 10*12/L — ABNORMAL LOW (ref 4.00–5.20)
RDW-CV: 12.9 % (ref 11.7–14.4)
WBC Count: 10.3 10*9/L — ABNORMAL HIGH (ref 3.4–10.0)

## 2023-01-23 LAB — ASPARTATE TRANSAMINASE: AST: 11 U/L (ref 5–44)

## 2023-01-23 MED ORDER — NIFEDIPINE ER 30 MG TABLET,EXTENDED RELEASE 24 HR
30 | ORAL_TABLET | Freq: Every day | ORAL | 11 refills | Status: DC
Start: 2023-01-23 — End: 2023-05-15
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
## 2023-01-23 NOTE — Progress Notes (Signed)
PHQ-9 Score  PHQ-9 Total Score: 9  If you checked off any problems, how difficult have these problems made it for you to do your work, take care of things at home, or get along with other people?: Very difficult  PHQ-9 Status: Complete     GAD-7 Score  GAD-7 Total Score: (!) 11  If you checked off any problems, how difficult have these problems made it for you to do your work, take care of things at home, or get along with other people?: Somewhat difficult     Durene Cal LVN

## 2023-01-23 NOTE — Patient Instructions (Signed)
Please start nifedipine 30 mg XL daily   Goal blood pressure is less than 140/90  I will call to check in on Monday  Please come in with headaches, vision changes, RUQ pain, not feeling well    I am so sorry about your brother and wish you and your family peace in this difficult time.    3rd trimester labs  Between 24 and 28 weeks, you should go to the lab for your glucose test (to test for pregnancy-related diabetes).  We will also re-check for anemia at this time, since women often have anemia in the third trimester; and recheck for syphilis.  Be prepared to stay for about 1-2 hours.  You should not fast for these labs, but it is best to avoid a large meal or high carbohydrate meal immediately before the test.    Go to the lab where you will be given a glucose solution to drink and your blood glucose level will be drawn one hour later.     Normal is a blood glucose level under 130 mg/dl.  A blood glucose level of 200 mg/dl or higher results in a diagnosis of diabetes in pregnancy and someone from the clinic will contact you to discuss next steps.    If your blood glucose level is 130-199 mg/dl, you will need to return to the lab another day for further testing.     [facility]  Please bring your picture ID with you to the lab.  No appointment is necessary    [facility] - Please arrive at lab before 3:30PM  [facility] Building, [address], [address] Floor  Phone: [phone] on the 3rd floor  Hours: Monday to Friday, 7 am - 5:30 pm    [facility] - Please arrive at lab before 3:30PM   [address]  [place], [place] [address]  Phone: [phone]  Hours: Monday to Friday, 7 am - 5:30 pm    [facility] - Please arrive at lab before 3:30PM  [address]  [place], [place] [address]  Phone: [phone]  Hours: Monday to Friday, 7:30 am - 6:30 pm    [facility] - Please arrive at lab before 3:30PM  [address]nd [address]  [place], [place] [address]  Phone: [phone]  Hours: Monday to Friday, 8:00 am - 12:00 pm and 1:00 pm - 4:30 pm     [facility] -  Please arrive at lab before 10:30 am for a morning blood draw or 2:00 PM for an afternoon blood draw  [address] 4th Floor, [address]  [place], [place] [address]  Phone: [phone]  Hours: Monday to Friday, 8:00 am - 5:00 pm (closed 12:00pm -1:00 pm)

## 2023-02-02 ENCOUNTER — Ambulatory Visit
Payer: MEDICAID | Attending: Physician Assistant | Primary: Student in an Organized Health Care Education/Training Program

## 2023-02-02 DIAGNOSIS — Z9884 Bariatric surgery status: Secondary | ICD-10-CM

## 2023-02-02 NOTE — Telephone Encounter (Signed)
Synopsis SmartLink 02/02/2023    11:25 02/02/2023    11:24 01/23/2023    16:14 10/21/2022    13:58 02/26/2022    08:28   PHQ-9/GAD-7 Clinician/Patient Input   PHQ-9 Score Clinican/Patient Entered 9  9 4 4    GAD-7 Score Clinican/Patient Entered  7 11 9 3                 11/23/2019     1:34 PM   YBOCS Date   YBOCS Score 24

## 2023-02-02 NOTE — Progress Notes (Signed)
I performed this consultation using real-time Telehealth tools, including a live video connection between my location and the patient's location. Prior to initiating the consultation, I obtained informed verbal consent to perform this consultation using Telehealth tools and answered all the questions about the Telehealth interaction.    HPI  I saw Kayla Frey today in the [facility]. This is her annual follow up.  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    She is enjoying a wide range of food types without significant nausea, vomiting, or dysphagia.  She has had no clinical symptoms of intestinal obstruction or marginal ulceration.  The patient has not had any readmissions, reoperations, or interventions related to their bariatric surgery since their last office visit.  She is taking a daily bariatric multivitamin: Currently taking regular prenatals.  She is taking a daily calcium + vitamin D supplement: Taking vitamin D 5000 daily. Also taking vitamin B12 supplement.    Taking ASA and Nefidine for preeclampsia. Prescribed prevacid by OB. No issues with heartburn.     She is 6 months pregnant, due in September.    Diet-wise, was told by OB to eat 3 servings of whole grains/day. She is tired of boiled eggs, soy beans and protein chips. She does not eat beef and pork, allergic to shellfish. She eats mostly [place] and chicken.  Yesterday diet recall:  - Breakfast: scrambled eggs, [place] sausage, coffee  - Lunch: skipped  - Snacks: cherries, apple, potato chips  - Dinner: [place] tacos - ground [place], shredded cheese, sour cream, lettuce and tomatoes, flour shell  - Snack: nectarine, peach    Hydration-wise, drinks 16 oz of water a day, adds Crystal Light and lemon for flavoring. No soda. Drinks raspberry tea, [place].     Asked about excess skin removal as she is experiencing rashes.    Weight Loss and Diet  Today's (!) 148.3 kg (327  lb), giving a calculated body-mass index of Body mass index is 51.22 kg/m. her preoperative weight was 439 pounds.     Her weight history is as follows:   Wt Readings from Last 10 Encounters:   02/02/23 (!) 148.3 kg (327 lb)   01/23/23 (!) 148.3 kg (327 lb)   12/25/22 (!) 146.1 kg (322 lb)   11/21/22 (!) 144.7 kg (319 lb)   10/21/22 (!) 143.4 kg (316 lb 3.2 oz)   03/12/22 (!) 137.4 kg (303 lb)   01/29/22 (!) 131.5 kg (290 lb)   10/29/21 (!) 136.1 kg (300 lb)   10/23/21 (!) 136.1 kg (300 lb)   10/16/21 (!) 136.1 kg (300 lb)     Obesity-related Co-morbidity Resolution  Before bariatric surgery, her obesity-related co-morbidities were:  Diabetes - Never Diagnosed  Hypertension - Never Diagnosed  Hyperlipidemia - Never Diagnosed  Obstructive Sleep Apnea - Never Diagnosed   OSA on CPAP -- No  GERD - Never Diagnosed    Nutritional and Metabolic Laboratory Assessment  I reviewed the patient's nutritional and metabolic labs today from 12/17/22:  Low:  Vit B12 272  Vit D 18  Hgb 10.6  Hct 31.7  RBC 3.41    Missing remaining    Medications and Vitamins  I reviewed and changed her medications and vitamins supplements today as needed, which are:  Current Medications         Dosage    aspirin 81 mg EC tablet Take 2 tablets (162 mg total) by mouth nightly at bedtime  biotin 10,000 mcg TABRAPID Take 1 tablet by mouth    blood pressure monitor KIT Monitor blood pressure weekly in setting of preparing for pregnancy    calcium carbonate (CALCIUM 600 ORAL) Take 600 mg by mouth Pt reports to be consuming 2 tablets/ day for a total of 1200 mg    cholecalciferol, vitamin D3, 1000 UNITS tablet Take 2 tablets (2,000 Units total) by mouth daily    cyanocobalamin, Vitamin B12, 1,000 mcg tablet Take 2 tablets (2,000 mcg total) by mouth daily    EPINEPHrine (EPIPEN) 0.3 mg/0.3 mL injection Inject 0.3 mL (0.3 mg total) into the muscle once as needed for Anaphylaxis for up to 1 dose Use as instructed    escitalopram oxalate (LEXAPRO) 20 mg  tablet Take 1.5 tablets (30 mg total) by mouth daily    ferrous sulfate 325 mg (65 mg elemental) tablet Take 1 tablet (325 mg total) by mouth every other day    lansoprazole (PREVACID) 30 mg capsule Take 1 capsule (30 mg total) by mouth daily    multivitamin-min-iron-FA-vit K (PROCARE BARIATRIC) 45 mg iron- 800 mcg-120 mcg capsule Take 1 capsule by mouth nightly at bedtime    NIFEdipine (PROCARDIA XL) 30 mg 24 hr tablet Take 1 tablet (30 mg total) by mouth daily    prenatal vitamin-iron fumarate-folic acid 28 mg iron- 800 mcg tablet Take 1 tablet by mouth daily    valACYclovir (VALTREX) 500 mg tablet Take 1 tablet (500 mg total) by mouth daily For suppression.            Physical Exam   Vitals: Wt (!) 148.3 kg (327 lb)   LMP 08/09/2022 (Approximate)   BMI 51.22 kg/m   Constitutional: oriented to person, place, and time.    Assessment and Plan  This is a 37 y.o. female who is 6 months pregnant struggling with nutrition after bariatric surgery. I will keep you apprised of her progress.    1. Status post laparoscopic sleeve gastrectomy  - Has many protein limitations, discussed protein options  - Recommend Procare bariatric prenatals  - I have given the patient a list of laboratory work to be done beforehand which can be completed at [facility].   - Increase water intake - goal is 64oz a day.  - Encouraged patient to prioritize high-quality protein in diet to reach 60g a day.   - Reviewed criteria for [facility] surgery: BMI < 30, stable weight for 6 months  - Follow up in 1 year    Goals after bariatric surgery:  - Patients should strive for a healthy BMI with as best resolution / prevention of obesity related comorbid diseases as possible.  ?  Daily Water Requirements:  - Daily water goals for the standard patient including 64 oz minimum water intake (1-2 Liters) some medical conditions may require careful balancing of water intake to avoid health complications, please refer to your specialists as needed to adjust  water intake adequately to maintain hydration but minimize risk of exacerbation of chronic disease.  ?  Daily Multi Vitamins:  - It is very important to take a daily high potency multi vitamin for life. The [facility] team recommends the Bariatric Pro Care Multi Vitamin with 45mg  of iron (chewable or capsule) once daily (capsules can be swallowed whole).  - This is available only online without need of a prescription at [email or web address]  ?  Daily Calcium Supplementation:  - You should take 1200-1500 mg daily of calcium citrate for life. It  is best to take your calcium supplementation in small doses throughout the day for best absorption. There are many forms available including: tablet, chewable, soft chew, gummies etc. It is recommended that patients find a calcium citrate brand that they enjoy, that is calorie free or very low calorie. These are available at any pharmacy or online without the need of a prescription. We recommend trying the Celebrate brand Calcium citrate soft chews available at celebratevitamins.com  ?  Daily Protein Requirements:  - Goal should be protein intake between 60-80 grams per day. Some options to help you reach your daily goals including protein shakes or powders, lean meats ([place], fish, chicken), tofu, etc. In addition you will need green vegetable intake to maintain adequate nutrition and you should avoid sugars, carbohydrates, starches, pasta, rice, bread, and potatoes.  ?  Calorie Goals - Weight Loss Phase:  - Daily Calorie Goal of 500-700 total per day, focusing on lean proteins and leafy green vegetables during the weight loss phase or until you have reached your desired goal weight.  ?  Calorie Goals - Weight Maintenance Phase:  - Daily Calorie Goal of around 1000 per day (+/- 100-200 calories) once you have achieved your weight loss goal. You should continue to count and log calories for life even when at goal weight to maintain desired weight. You may need to adjust  your calories up or down to maintain their weight at goal over time.  ?  Medications:  - Please check with your PCP or specialists for medication adjustments and refills as needed after bariatric surgery. The only medications that you should NOT take for life after bariatric surgery are the Non Steroidal Anti Inflammatory Drugs (NSAIDs). These include Asprin (unless prescribed by a cardiologist), Advil, Aleve, Naproxen, Motrin, and Ibuprofen, etc.  ?  Smoking cigarettes should never occur after bariatric surgery. Minimal alcohol intake is acceptable 3 months after surgery, but any alcohol (wine or beer) intake should be limited to 2oz. / 8 hours, as the effect of alcohol will be more pronounced.   ?  We recommend that you continue to carefully count and restrict calories according to the postoperative bariatric surgery protocol and your nutritionist-specific recommendations. Caloric intake should be recorded separately either online or in a bariatric surgery journal for daily review. Daily calorie counting is imperative for successful and long-term weight loss, and can employ the following tips:  - Identify a set time and place to count calories - make this process a daily habit.   - Using calorie counting aids (web-based: www.fitday.com) can help.   - Use a personal food space at home to help concentrate on food types that others in the household may not be eating.  - Advance meal planning using a daily / weekly spreadsheet.   - Time your meals to less than 10 minutes per meal (eg. use an egg timer if necessary)     Also, attendance at a support group is highly encouraged. The [facility] Group meets the 3rd Wednesday of every month at 5pm - the sessions are currently being held virtually due to COVID-19.  Please join our email list to receive the Zoom information each month. Patients should feel free to join this group any time and bring friends or family members. To add an email to the support  group mailing list, send request to: [email or web address].     APP Visit Information:     APP Service Type:  Independent  Available MD consultant:  Georgia Duff, MD    I spent a total of 30 non-overlapping minutes on this patient's care on the day of their visit excluding time spent related to any billed procedures. This time includes time spent with the patient as well as time spent documenting in the medical record, reviewing patient's records and tests, obtaining history, placing orders, communicating with other healthcare professionals, counseling the patient, family, or caregiver, and/or care coordination for the diagnoses above.      Lisbeth Renshaw, PA-C  [facility]: Bariatrics

## 2023-02-23 NOTE — Telephone Encounter (Signed)
Synopsis SmartLink 02/23/2023    10:31 02/23/2023    10:30 02/02/2023    11:25 02/02/2023    11:24 01/23/2023    16:14 10/21/2022    13:58   PHQ-9/GAD-7 Clinician/Patient Input   PHQ-9 Score Clinican/Patient Entered 12  9  9 4    GAD-7 Score Clinican/Patient Entered  13  7 11 9                 11/23/2019     1:34 PM   YBOCS Date   YBOCS Score 24

## 2023-02-27 ENCOUNTER — Telehealth
Admit: 2023-03-09 | Discharge: 2023-03-09 | Payer: MEDICAID | Attending: Psychiatry | Primary: Student in an Organized Health Care Education/Training Program

## 2023-02-27 DIAGNOSIS — F411 Generalized anxiety disorder: Secondary | ICD-10-CM
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
## 2023-02-27 NOTE — Patient Instructions (Addendum)
Escitalopram (lexapro) safety in pregnancy and breastfeeding:  Citalopram  Escitalopram (Celexa  Lexapro) - MotherToBaby

## 2023-03-05 ENCOUNTER — Ambulatory Visit
Admit: 2023-03-05 | Payer: MEDICAID | Attending: Physician | Primary: Student in an Organized Health Care Education/Training Program

## 2023-03-05 ENCOUNTER — Ambulatory Visit: Admit: 2023-03-06 | Payer: MEDICAID | Primary: Student in an Organized Health Care Education/Training Program

## 2023-03-05 DIAGNOSIS — O0993 Supervision of high risk pregnancy, unspecified, third trimester: Secondary | ICD-10-CM

## 2023-03-05 MED ORDER — DIPHTH,PERTUSSIS(ACEL),TETANUS 2.5 LF UNIT-8 MCG-5 LF/0.5ML IM SYRINGE
Freq: Once | INTRAMUSCULAR | Status: AC
Start: 2023-03-05 — End: 2023-03-05
  Administered 2023-03-06: 0 mL via INTRAMUSCULAR

## 2023-03-05 MED ORDER — DIPHTH,PERTUSSIS(ACEL),TETANUS 2.5 LF UNIT-8 MCG-5 LF/0.5ML IM SYRINGE
INTRAMUSCULAR | Status: AC
Start: 2023-03-05 — End: ?

## 2023-03-05 MED FILL — BOOSTRIX TDAP 2.5 LF UNIT-8 MCG-5 LF/0.5 ML INTRAMUSCULAR SYRINGE: 2.5-8-5 2.5-8-5 Lf-mcg-Lf/0.5mL | INTRAMUSCULAR | Qty: 1

## 2023-03-05 NOTE — Patient Instructions (Addendum)
Between 24 and 28 weeks, you should go to the lab for your glucose test (to test for pregnancy-related diabetes).  We will also re-check for anemia at this time, since women often have anemia in the third trimester; and recheck for syphilis.  Be prepared to stay for about 1-2 hours.  You should not fast for these labs, but it is best to avoid a large meal or high carbohydrate meal immediately before the test.    Go to the lab where you will be given a glucose solution to drink and your blood glucose level will be drawn one hour later.     Normal is a blood glucose level under 130 mg/dl.  A blood glucose level of 200 mg/dl or higher results in a diagnosis of diabetes in pregnancy and someone from the clinic will contact you to discuss next steps.    If your blood glucose level is 130-199 mg/dl, you will need to return to the lab another day for further testing.     [facility]  Please bring your picture ID with you to the lab.  No appointment is necessary    [facility] - Please arrive at lab before 3:30PM  [facility], [address], Third Floor  Phone: [phone] on the 3rd floor  Hours: Monday to Friday, 7 am - 5:30 pm    [facility] - Please arrive at lab before 3:30PM   [address]  [place], [place] [address]  Phone: [phone]  Hours: Monday to Friday, 7 am - 5:30 pm    [facility] - Please arrive at lab before 3:30PM  [address]  [place], [place] [address]  Phone: [phone]  Hours: Monday to Friday, 7:30 am - 6:30 pm    [facility] - Please arrive at lab before 3:30PM  [address]nd [address]  [place], [place] [address]  Phone: [phone]  Hours: Monday to Friday, 8:00 am - 12:00 pm and 1:00 pm - 4:30 pm     [facility] -  Please arrive at lab before 10:30 am for a morning blood draw or 2:00 PM for an afternoon blood draw  [address] [facility]. (between [employer]. [address] 4th Floor, [address]  [place], [place] [address]  Phone: [phone]  Hours: Monday to Friday, 8:00 am - 5:00 pm (closed 12:00pm -1:00 pm)      Natural methods to help with constipation:  Our goal is to re-establish normal bowel movements and relieve constipation  Normal bowel movements:   Occur 1-2 times every day   Are soft   Are passed without pain, bleeding or straining    Here's how to get there!    Try adding fiber:  Beans, lentils, chickpeas, nuts, vegetables, fruits (especially with peel), and whole grains all contain fiber.  Goal is 30 grams of fiber of a day.  If this is not enough, add an over-the-counter fiber supplement 1-2 times a day.  There are many options--I recommend Benefiber or Metamucil.  Here is a nice website with some meal ideas:  VoiceTranslations.de    Try adding foods & drinks that work as natural laxatives:  Try prunes (~1.5 ounces) for a snack each day.  If you enjoy coffee, that may help too!  It's safe to drink 1-2 cups a day in pregnancy.  Take Magnesium 200-300mg  at bedtime (ex: "Calm Magnesium" powder)  Try to take advantage of the gastrocolic reflex with the "daily sit":  Before you go to sleep, place a full glass of water by your bed  Set your alarm ~10 minutes early  When you wake up, drink the whole glass of water, then lie down on your left side for 5-10 minutes.  Go sit on the toilet for 10-15 minutes, and if you have the urge, have a bowel movement.  If not, don't strain!  Try repeating this for at least a week--it might take some time to make this a routine.  For some people, this "daily sit" works much better after breakfast--you could try this timing instead.    And don't forget the basics:  Aim for 30 minutes of exercise each day.  It can be broken up (ex: 10 minute walks, 3 times a day counts.  And walking for 10 minutes is a lot better than none!)  Drink water throughout the day; don't let yourself go thirsty!    What to do if this isn't  enough:  Continue all of the above, and add Miralax.  Miralax is a safe, effective laxative often used to treat constipation in young children.  It is safe to use in pregnancy or while breastfeeding as it isn't absorbed into your body.  Start with 1 dose (17 grams of powder) at bedtime: Use the MiraLAX bottle top to measure 17g by filling to the indicated line in the cap. Mix and dissolve into 4-8 ounces of any beverage (hot, cold or room temperature).   If once a day is not enough, take twice a day.  If this it not enough, we may need to start with a "Miralax washout."  Email me for detailed instructions about this     Third Trimester To-Do List    More information on many of these topics can be found in the book you received, Caring for Yourself During Pregnancy and Beyond https://womenshealth.http://mills.com/. When that is the case, the relevant page numbers are included in parentheses.    ? Received a PPD test for tuberculosis (TB) if recommended (p. 22)  ? Received a Tdap vaccine for whooping cough (loose handout in back cover pocket) or ApartmentProfile.is.html  ? Received the flu vaccine, if indicated by my provider    ? Signed up for a birth center orientation at [phone]  ? Also available to watch online: http://lara.info/ or SearchPrisoners.de    ? Familiarized myself with when and where to come for my delivery (p. 67)    ? Chose a pediatrician (pp. 34)  ? Contact your insurer regarding coverage for breast pump purchase/rental  ? Spoke to my employer/HR about disability and family leave benefits (p. 32)  ? Scheduled a postpartum appointment for six weeks after my due date    ? Reviewed my birth preferences with my provider (pp. 75-50)  ? Know to call if I am in labor or leaking water: Birth Center at [phone]  ? Know to bring two forms  of ID to my delivery, one with a photo  ? Completed the birth certificate worksheet and will bring it to my delivery (pp. 52-53)    ? Long term planning for new family: health, disability, life insurance policies  ? Think about and designate a health care decision maker   (Advanced Directive paperwork is available online at SockSuppliers.fi )    Going Home from the Hospital after Birth  ? Arrange a ride home by 12:00pm (noon) on your  going-home day  ? Bring an infant car seat  ? Plan for a 1-2 night stay for a vaginal delivery  ? Plan for a 3-4 night stay for a cesarean section
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
## 2023-03-05 NOTE — Progress Notes (Signed)
ID check x 2. Tdap VIS (03/30/20) given to pt. Tdap 0.5 mL administered IM into pts Left deltoid. Pt tolerated IM injection well without difficulty. Pt left stable.

## 2023-03-06 LAB — COMPREHENSIVE METABOLIC PANEL (BMP, AST, ALT, T.BILI, ALKP, TP ALB)
AST: 14 U/L (ref 5–44)
Alanine transaminase: 14 U/L (ref 10–61)
Albumin, Serum / Plasma: 2.9 g/dL — ABNORMAL LOW (ref 3.5–5.0)
Alkaline Phosphatase: 78 U/L (ref 38–108)
Anion Gap: 6 (ref 4–14)
Bilirubin, Total: 0.2 mg/dL (ref 0.2–1.2)
Calcium, total, Serum / Plasma: 8.9 mg/dL (ref 8.4–10.5)
Carbon Dioxide, Total: 24 mmol/L (ref 22–29)
Chloride, Serum / Plasma: 109 mmol/L (ref 101–110)
Creatinine: 0.73 mg/dL (ref 0.55–1.02)
Glucose, non-fasting: 86 mg/dL (ref 70–199)
Potassium, Serum / Plasma: 4.4 mmol/L (ref 3.5–5.0)
Protein, Total, Serum / Plasma: 6.3 g/dL (ref 6.3–8.6)
Sodium, Serum / Plasma: 139 mmol/L (ref 135–145)
Urea Nitrogen, serum/plasma: 9 mg/dL (ref 7–25)
eGFRcr: 109 mL/min/{1.73_m2} (ref >59)

## 2023-03-06 LAB — COMPLETE BLOOD COUNT
Hematocrit: 30.5 % — ABNORMAL LOW (ref 36.0–46.0)
Hemoglobin: 10.3 g/dL — ABNORMAL LOW (ref 12.0–15.5)
MCH: 31.3 pg (ref 26.0–34.0)
MCHC: 33.8 g/dL (ref 31.0–36.0)
MCV: 93 fL (ref 80–100)
MPV: 10.7 fL (ref 9.1–12.6)
Platelet Count: 272 10*9/L (ref 140–450)
RBC Count: 3.29 10*12/L — ABNORMAL LOW (ref 4.00–5.20)
RDW-CV: 13.3 % (ref 11.7–14.4)
WBC Count: 9.7 10*9/L (ref 3.4–10.0)

## 2023-03-06 LAB — LIPID PANEL (INCL. LDL, HDL, TOTAL CHOL. AND TRIG.)
Chol HDL Ratio: 2.9 (ref <5.6)
Cholesterol, HDL: 69 mg/dL (ref >39)
Cholesterol, Total: 198 mg/dL (ref <200)
LDL Cholesterol: 113 mg/dL (ref <130)
Non HDL Cholesterol: 129 mg/dL (ref <160)
Triglycerides, serum: 78 mg/dL (ref <200)

## 2023-03-06 LAB — SYPHILIS SCREEN BY RPR WITH REFLEX TO TREPONEMAL ANTIBODY: RPR: NONREACTIVE

## 2023-03-06 LAB — VITAMIN D, 25-HYDROXY: Vitamin D, 25-Hydroxy: 32 ng/mL (ref 20–50)

## 2023-03-06 NOTE — Telephone Encounter (Incomplete)
Pt called back regarding message to book 28wk scan   Pt expressed concern that she is coming to too many scan appointments and this does cost her money to make the trips to [facility], pt declined to scheduled appt for sooner then 08/06 schedule     Pt was informed this information will be relayed to obgyn provider.

## 2023-03-06 NOTE — Telephone Encounter (Signed)
-----   Message from Debroah Baller, MD sent at 03/05/2023  5:32 PM PDT -----  Regarding: growth scan  Hi - hoping we can move this patient's growth scan up (I.e. sooner) given multiple risk factors for FGR. 28-30 weeks ideal. Thanks!

## 2023-03-06 NOTE — Telephone Encounter (Signed)
Lvm asking pt to call back and coordinate an earlier growth scan.

## 2023-03-07 LAB — PARATHYROID HORMONE, INTACT: PTH: 24 ng/L (ref 18–90)

## 2023-03-12 LAB — THIAMINE PYROPHOSPHATE: Thiamine Pyrophosphate: 108 nmol/L (ref 70–180)

## 2023-03-13 MED ORDER — PROPRANOLOL 10 MG TABLET
10 mg | ORAL_TABLET | ORAL | 1 refills | Status: DC
Start: 2023-03-13 — End: 2023-05-15

## 2023-03-13 NOTE — Telephone Encounter (Signed)
Current pended order:  propranoloL (INDERAL) 10 mg tablet [Pharmacy Med Name: PROPRANOLOL 10MG  TABLETS]  TAKE 1 TABLET(10 MG) BY MOUTH DAILY AS NEEDED FOR ANXIETY    Last time approved:  propranoloL (INDERAL) 10 mg tablet  Take 1 tablet (10 mg total) by mouth daily as needed (for anxiety) for up to 90 days  Dispense: 90 tablet Refill: 1  [facility] 1545 2 by Leretha Pol ROSE on 05/19/22      Last office visit:    09/10/2021   Last video visit:     09/22/2022   Last scheduled telephone encounter:  Visit date not found  Last telephone visit:     09/26/2022    Next appointment:  Visit date not found    Last  BP:  BP Readings from Last 3 Encounters:   03/05/23 (!) 141/52   01/23/23 (!) 160/68   11/21/22 121/64       Last Labs:    Lab Results   Component Value Date    Creatinine 0.73 03/05/2023    Sodium, Serum / Plasma 139 03/05/2023    Potassium, Serum / Plasma 4.4 03/05/2023    Hemoglobin A1c 4.8 10/29/2022    Cholesterol, Total 198 03/05/2023    LDL Cholesterol 113 03/05/2023    Cholesterol, HDL 69 03/05/2023    Thyroid Stimulating Hormone 0.85 12/17/2022    Hemoglobin 10.3 (L) 03/05/2023    eGFRcr 109 03/05/2023    eGFR - low estimate 90 10/24/2020    eGFR - high estimate 104 10/24/2020       If patient has not been seen in clinic for 12 months or more I have routed encounter to the administrative team to book a follow-up appointment.

## 2023-03-16 LAB — COPPER, SERUM/PLASMA: Copper, serum/plasma: 202 ug/dL — ABNORMAL HIGH (ref 70–175)

## 2023-03-19 ENCOUNTER — Telehealth: Admit: 2023-03-19 | Payer: MEDICAID | Primary: Student in an Organized Health Care Education/Training Program

## 2023-03-19 DIAGNOSIS — Z3493 Encounter for supervision of normal pregnancy, unspecified, third trimester: Secondary | ICD-10-CM

## 2023-03-30 NOTE — Telephone Encounter (Signed)
Briefly, Ms. Kayla Frey is a 37 y.o. G1P0000 @ [redacted]w[redacted]d s/p gastric sleeve in 2021, cHTN     S: sent MyChart c/o intermittent abdominal pain when standing/walking    O/A: Pt states she has some soreness in her lower abdomen every once in awhile after sitting for long periods of time. First noticed this 3 days ago, describes it as a shooting pain in her lower abdomon/pelvis that lasts for a few seconds and then resolves after stretching and walking.     Denies bleeding, loss of fluid. Endorsing positive fetal movement.     P: RN recommend patient stand up slowly from a seated position to see if this helps with lower abdominal pain. Recommended staying well hydrated. Preterm labor precautions given. ATC appt scheduled for 8/12. All questions answered.

## 2023-03-31 ENCOUNTER — Inpatient Hospital Stay: Admit: 2023-03-31 | Payer: MEDICAID | Primary: Student in an Organized Health Care Education/Training Program

## 2023-03-31 ENCOUNTER — Ambulatory Visit
Admit: 2023-03-31 | Payer: MEDICAID | Attending: Maternal & Fetal Medicine | Primary: Student in an Organized Health Care Education/Training Program

## 2023-03-31 DIAGNOSIS — Z369 Encounter for antenatal screening, unspecified: Secondary | ICD-10-CM

## 2023-03-31 DIAGNOSIS — O09513 Supervision of elderly primigravida, third trimester: Secondary | ICD-10-CM

## 2023-03-31 DIAGNOSIS — O09523 Supervision of elderly multigravida, third trimester: Secondary | ICD-10-CM

## 2023-03-31 NOTE — Progress Notes (Signed)
Reproductive Genetics Progress Notes, Consult Letters and Interpretations are filed in Chart Review under the "Notes" tab. Reproductive Genetics Ultrasounds are filed under the "Imaging" tab.

## 2023-03-31 NOTE — Procedures (Signed)
[facility] [place]  Department of OB/GYN & Reproductive Sciences  [facility]  [address], [address] Floor  [place], [place]  [address]  [phone]    March 31, 2023    York Cerise, NP  [facility]  [address]  [place], [place] [address]    Ultrasound Report     Re: Chestine Theriault (DOB: 1986/06/29)  [facility] MRN: [id number]   INDICATION: maternal age, screening for malformations and growth  TECHNIQUE: transabdominal     Dear York Cerise:    [name], who is a 37 year old grav 1, para 0, was seen on March 31, 2023  for ultrasound due to: maternal age, screening for malformations and growth.      There is a single living fetus in vertex presentation. The placenta is  posterior. The placental cord insertion was not visualized. The amniotic fluid  volume is normal. The DVP is 6 cm. The uterus was unremarkable. No adnexal  abnormalities were identified, although neither ovary was visualized.     The following measurements were obtained:      BPD    78 mm,    32 weeks    1 days      HC    298 mm,    33 weeks    0 days      AC    289 mm,    33 weeks    0 days      FL    63 mm,    32 weeks    4 days  Gestational age based on biometry is 32 weeks 5 days with an EDD of 05/21/2023.  The EFW is 2049 grams. This is in the 59.9 %ile.     Gestational age based on the patient's reported EDC of 05/25/2023 is 32 weeks 1  days.     A limited third trimester survey of the fetal anatomy was performed and is  comprised of, but not limited to, the following views where technically  possible: cerebral ventricles, midline falx, cavum septi pellucidi,  four-chamber view of the heart, spine, stomach, kidneys, and urinary bladder.  The exam was limited by late gestational age and fetal position. No major  structural abnormalities were identified, although visualization was limited by  maternal body habitus and fetal position.     Impressions: The fetus is appropriately grown  for gestational age with normal  amniotic fluid volume. A limited third trimester survey was performed and  appeared normal for the gestational age.     Sincerely yours,    Gerlene Fee, M.D.  Perinatal Medicine & Genetics    Electronic Signature: Gerlene Fee, M.D.  Signed 03/31/2023 at 2:38 PM

## 2023-04-06 ENCOUNTER — Ambulatory Visit
Admit: 2023-04-06 | Payer: MEDICAID | Attending: Physician | Primary: Student in an Organized Health Care Education/Training Program

## 2023-04-06 DIAGNOSIS — O09513 Supervision of elderly primigravida, third trimester: Secondary | ICD-10-CM

## 2023-04-06 LAB — NST/AFI SINGLE OR FIRST GEST.(PER PROTOCOL, INCL. BPP)
AFI (cm): 20 cm
DEEPEST VERTICAL POCKET (CM): 5.7 cm
FHR Baseline: 120
NST Interpretation: REACTIVE

## 2023-04-06 NOTE — Progress Notes (Signed)
Patient seen in [facility].

## 2023-04-16 ENCOUNTER — Ambulatory Visit
Admit: 2023-04-16 | Payer: MEDICAID | Attending: Physician | Primary: Student in an Organized Health Care Education/Training Program

## 2023-04-16 DIAGNOSIS — O0993 Supervision of high risk pregnancy, unspecified, third trimester: Secondary | ICD-10-CM

## 2023-04-16 DIAGNOSIS — O10919 Unspecified pre-existing hypertension complicating pregnancy, unspecified trimester: Secondary | ICD-10-CM

## 2023-04-16 DIAGNOSIS — O10913 Unspecified pre-existing hypertension complicating pregnancy, third trimester: Secondary | ICD-10-CM

## 2023-04-16 LAB — NST/AFI SINGLE OR FIRST GEST.(PER PROTOCOL, INCL. BPP)
AFI (cm): 24.31 cm
DEEPEST VERTICAL POCKET (CM): 7.11 cm
FHR Baseline: 135
NST Interpretation: REACTIVE

## 2023-04-16 MED ORDER — BLOOD GLUCOSE TEST STRIPS
ORAL_STRIP | 11 refills | Status: DC
Start: 2023-04-16 — End: 2023-05-15

## 2023-04-16 MED ORDER — LANCETS
11 refills | Status: DC
Start: 2023-04-16 — End: 2023-05-15

## 2023-04-16 MED ORDER — BLOOD-GLUCOSE METER KIT
0 refills | Status: DC
Start: 2023-04-16 — End: 2023-04-21

## 2023-04-16 MED ORDER — SHARPS CONTAINER
0 refills | Status: DC
Start: 2023-04-16 — End: 2023-05-15

## 2023-04-16 NOTE — Progress Notes (Signed)
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 irregular menses, often going 3-4 months without menses and then spotting several times during one month. Sexally active w fiance, do not use contraception. Initial w/u neg for prolactinoma, thyroid etiology and pregnancy. FSH, TSH, prolactin, HCG wnl. F/t/h elevated testosterone in 2001, WNL on recheck in 2021. Given e/o clinical hyperandrogenism and irregular menses, most c/w PCOS. Previously tried OCPs but cuased worsening heavy vaginal bleeding w increased spotting and worsening abdominal cramping. 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 Also gets hives and GI sxs w shellfish, iodine and morphine.   - consider allergy testing  - epi pen prn       History of tobacco use 09/15/2019     H/o daily 1/2 pack per day tobacco use for ~ 6 years. Tried nicotine patch but irritated skin and hesitant to use nicotine gum bc does not eat gelatin. Interested in oral agent to reduce cravings.Referred to [facility] tobacco cessation program and started on chantix. Denies use in pregnancy.        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  - Assess interest in quitting at next appt    10/21/22 using bong twice daily for mgmt of depression and anxiety sx      Anxiety and depression 11/22/2012     Longstanding issue. Does endorse having anxiety attacks with tachycardia/palpitations though notes sometimes  the cardiac symptoms seem to precede anxiety (see separate problem). Previously on Lexapro- discontinued in pregnancy after counseling about R/B/A. Treating with marijuana- smoking ~2 times a day- are of potential associations. Uses family and faith/prayer to cope. Declines referral to therapy, including to to [facility] Clinical Psychologist Dr. Camillo Flaming as of 12/12/22.    [ ]  Follow up symptoms

## 2023-04-16 NOTE — Patient Instructions (Addendum)
Writing a birth plan can be a great way to communicate what is important to you.    Birth plans can come in the form of lists, letters, picture or any other way that you want to communicate.  Below are some tips about writing a birth plan.    I recommend that you watch our webinar about birth plans:  [email or web address]      Once you have a birth plan it is very important to discuss it with your provider.  Having a conversation about your birth plan is an important step in this process.  We recommend that you discuss your birth plan with your care provider at your 36 week visit.    Steps for writing a birth plan:    Keep it short. everything should fit on a single page. Providers and nurses will be able to retain the information better if it is concise.     Keep it positive. Instead of saying Dont offer me an epidural you could say I intend to birth without an epidural and would appreciate your help with this.     Keep it realistic. Knowing what the hospital has to offer and what is absolutely off limits is important. See below for information regarding [facility]    Keep it organized. Divide the birth plan into sections based on the labor process. i.e. a labor section that includes the ways you would like to be supported in labor, a birth section with the ways you would like to be supported in birth, a postpartum section and a baby section.     Keep it goal oriented. Start the birth plan with whatever is most important to you and ask your providers to help obtain this goal. For example, if the thing that is most important to you is avoiding surgery then say that. If the most important thing is that you feel empowered to make your own decisions then say that.     Keep it personal. This is not a cookie cutter form, it should be an expression of who you are, your hopes and even in some cases your fears.    Please come to a visit before 37 weeks with your birth plan in hand so that we can discuss  it.  This is one of the most important steps of the process.    You are welcome to use the birth plan found in your [facility] binder, but I encourage you to consider writing one on your own so that it expresses what is important to you.      Things you may want to include on your birth plan:  Your preferences for pain management  Your preferences for how to monitor your baby  Your preferences for pushing  Your preferences for cord clamping  Your preferences for breastfeeding  Your preferences for medications to control bleeding after delivery  Your preferences for newborn medications/ procedures  Your preferences if a cesarean birth is necessary    It may be helpful to know a few things about our practice, which might help simplify your birth plan.  If you are full term and your pregnancy is low risk (no medical complications requiring special treatment or monitoring) our policies include:  Women are free to move about in labor and labor in the shower or tub if desired  Women are free to eat and drink if they are laboring without an epidural  Women may choose whomever they like to support them during labor, visiting hours  are not limited during labor  Enemas and shaving of pubic hair are not offered  Women may choose to be intermittently monitored, unless continuous monitoring becomes medically necessary  Interventions like episiotomy, vacuum or forceps delivery, and cesarean section are only recommended for specific medical reasons, they would never be "routine."  We work hard to keep our rate of c-sections low.  Any proposed interventions for you or the baby would be discussed with you & your support team.  Pain medicine, including epidural, is available if you desire, but we support whatever choices you make regarding how you prefer to cope with your during labor.  You are allowed to decide in labor, you don't have to commit ahead of time.  You may push in any position that is comfortable & safe for you and your  baby  If your baby does not need immediate attention from the pediatricians, babies are routinely placed skin to skin with mothers after delivery and early breastfeeding is actively supported.  Lactation support from nurses or lactation consultants are provided to all mothers.    A few interventions we consistently recommend:  We encourage all patients to receive an IV placement. You will not need to be connected to the IV tubing or pole unless medically necessary but having the IV in place is our safety net to be able to help you in the case of an emergency. The IV will not stop you from walking, showering, being in the tub or any other desired labor activities.  After the baby is delivered, we recommend administration of pitocin (through an IV or as a shot) to reduce the risk of a postpartum hemorrhage.  This also reduces your chance of needing other interventions to stop bleeding.    You can find the [facility] sample birth plan in a PDF version of our Prenatal and Postpartum Guidebooks at: https://womenshealth.https://www.collins.biz/ under "patient resources".       Post-traumatic stress disorder (PTSD) is a condition of ongoing mental and emotional stress that can happen after suffering a stressful or extremely traumatic event. Unlike post-traumatic stress that lessens over time, the symptoms of PTSD do not fade. Symptoms of PTSD fall into the following four groups and can vary in severity:  Intrusion: may include intrusive thoughts or distressing memories, flashbacks, nightmares  Avoidance: may include avoiding thinking or talking about the event or their feelings; avoiding things that remind them of the event (people, places, activities).  Negative changes in thinking or mood: may include not remembering details about the event; negative thoughts and feelings about themselves or others; feeling numb or detached from others; loss of interest in activities.  Changes in physical and emotional reactions (arousal): may include  being irritable and having angry outbursts; self-destructive behavior; having problems concentrating or sleeping (1).    PTSD is more common than you might think.  It occurs in about 1 in 10 women. PTSD occurs more often in women than in men, and in pregnant people more than non-pregnant individuals.  (2). When it comes to psychiatric disorders during pregnancy, PTSD after childbirth or postpartum PTSD is considered the third most common mental health disorder after depression and nicotine dependence. (3) And if you also have anxiety or depression, you are in good company: 1 in 5.5 birthing people have this.     PTSD during pregnancy increases health risks.  PTSD during pregnancy has been found to increase risk for negative outcomes for both mother and baby. Pregnant persons with untreated PTSD have  a higher chance of experiencing negative birth outcomes including gestational diabetes (diabetes that develops during pregnancy), preeclampsia (severe high blood pressure), low birthweight (weight at birth of < 2500 grams,5.5 pounds), and preterm birth (before 37 weeks pregnancy). Also, quite alarming, PTSD is closely linked to attempting or committing suicide and substance abuse, two leading causes of maternal death in the [place]. (3) If left untreated or poorly treated, PTSD can have long lasting effects for pregnant people and their relationships with other people and affect bonding with their child and breastfeeding. These can lead to negative impacts on the child.      There are treatment options and support to help you.  If your trauma related symptoms are affecting your quality of life or relationships, we recommend that you speak to your health care provider about getting help. First line treatments include psychotherapy and antidepressant medication (selective serotonin reuptake inhibitors or SSRIs). (4) Therapy targeted at PTSD symptoms during pregnancy should focus on establishing a sense of safety and  coping with active symptoms. Other options include learning more about information about PTSD and healthy ways to cope, joining individual or group therapy to learn about managing symptoms during and after pregnancy, peer support groups and other medications that are safe for pregnancy and lactation. Please ask your provider to connect you with resources at [employer] or [employer]. The Perinatal Wellness Program has social workers and psychiatrists who can evaluate you and help you decide what treatment might be right for you. You can ask your provider for more information.      Planning for your Pregnancy, Labor and Delivery Experience:  Knowing that you might have particular needs or concerns about how providers care for your body and your baby, we invite you to share your concerns at any time.   During your third trimester your health care team will be asking you to complete a form so that we can plan for any special needs you might have during labor and delivery. This form is called the Trauma-Informed Delivery Checklist. It will provide you with an opportunity to communicate some of your needs to your care team. Additionally, your provider will review additional information with you during the 3rd trimester. Our goal is to understand your needs and support you as best as we can.     Resources and more information:   LocalExcellence.com.au  Postpartum Support International: https://aguirre-arnold.org/  National Maternal Mental Health Hotline: 858-460-4132 (870)640-8765)  BloggingAssistance.si  https://www.marchofdimes.org/find-support/blog/post-traumatic-stress-disorder-and-pregnancy   Garvin Fila. 100+ PTSD Quotes to Help Survivors Cope with Trauma.   Declutter the Mind, 22 October 2019. https://declutterthemind.com/blog/ptsd-quotes/. Accessed 14 December 2021    References  RoboDrop.co.nz  2.   Padin AC, Rolly Pancake, Che ML, et al. 2022. Screening for PTSD during pregnancy: a missed opportunity. [facility] Pregnancy Childbirth. Jun 14;22(1):487.  3.   Khsim IEF, Rodrguez MM, et al. 2022. Risk Factors for Post-Traumatic Stress Disorder after Childbirth: A Systematic Review. Diagnostics [employer]). Oct 26;12(11):2598.  4.   https://www.davis-walter.com/    Compiled by Manuela Neptune, MD

## 2023-04-16 NOTE — Telephone Encounter (Signed)
Spoke with patient. Patient declines scheduling any ANT appts between 8/22-9/3 because her insurance only begins to cover her transportation after 9/3. Patient aware that she can call to schedule at any time if she changes her mind.

## 2023-04-16 NOTE — Progress Notes (Signed)
Patient seen in [facility].

## 2023-04-21 MED ORDER — ACCU-CHEK GUIDE ME GLUCOSE METER
1 refills | Status: DC
Start: 2023-04-21 — End: 2023-05-15

## 2023-04-24 NOTE — Anesthesia Pre-Procedure Evaluation (Addendum)
[facility]  Anesthesia Preprocedure Evaluation    Procedure Information    Date: 04/24/23  Procedure: ANESTHESIA CONSULT ONLY         Precautions            None            Relevant Problems   No relevant active problems       Incomplete/Pended Pre-op Note  Anesthesia Encounter History        CC/HPI/Past Medical History Summary: 37 y.o. 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 Anesthesia is consulted today to address Anxiety around pain during labor/delivery, BMI.    PMH:  -Chronic hypertension: BPs well-controlled on current dose of nifedipine 30 mg daily. No signs/symptoms of pre-eclampsia.              -Continue nifedipine              -Not taking ASA      -Anxiety: Improved, has a plan to restart SSRI postpartum.     -HSV: Discussed suppression at 36 weeks. No outbreaks.     Denies cardiac disease, asthma, liver disease, renal disease, seizures, or clotting disorders.    Plan: No contraindications for neuraxial anesthesia. Patient counseled on pain management during labor and r/b/a to epidural. Also, we offered reassurance about high BMI not being a contraindication to epidural placement. Patient is interested in epidural placement and will coordinate with her once she starts labor for placement.         (Please refer to APeX Allergies, Problems, Past Medical History, Past Surgical History, Social History, and Family History activities, Results for current data from these respective sections of the chart; these sections of the chart are also summarized in reports, including the Patient Summary Extracts found in Chart Review)      Summary of Outside Records:    EKG 01/06/23:  NSR 62  TTE 01/06/23:  Conclusions:  Image quality was fair. The patient's blood pressure was 147 mmHg/67 mmHg during the study. The heart rate during the study was 68. Color Flow Doppler was utilized for this exam. Spectral Doppler  was utilized for this exam.  1. The left ventricular volume is normal. LV function is normal. 2D LV ejection fraction is estimated to be 65 to 70%. There is mild left ventricular hypertrophy. The pattern of left ventricular hypertrophy is concentric. No segmental wall motion   abnormalities present.  2. The right ventricular volume is normal. Right ventricular function is normal.  3. Left atrial size is normal. Right atrial size is normal.  4. There is no hemodynamically significant valvular disease.  5. There is no Doppler evidence of LV diastolic dysfunction.  6. The RV systolic pressure is at least 28 mmHg based on a right atrial pressure of 3 mmHg.  7. No pericardial effusion noted. The inferior vena cava is less than 21 mm in diameter and collapses with inspiration consistent with a right atrial pressure of 3 mmHg.  8. Aortic root dimension is normal.  Summary of Prior Anesthetics: Previous anesthetic without AAC  05/31/20 sleeve gastrectomy        Review of Systems   Constitutional: Negative.    HENT: Negative.  Positive for environmental allergies.   Eyes: Negative.    Respiratory: Negative.     Cardiovascular: Negative.    Musculoskeletal: Negative.    Neurological:  Positive for  headaches.   Hematological:  Positive for environmental allergies.       Physical Exam       Prepare (Pre-Operative Clinic) Assessment/Plan/Narrative      Obstructive Sleep Apnea Screening  STOPBANG Score:      S Do you Snore loudly (louder than talking or loud  enough to be heard through closed doors)?     T Do you often feel Tired, fatigued, or sleepy during daytime?     O Has anyone Observed you stop breathing during  your sleep?     P Do you have or are you being treated for high blood Pressure?     B BMI more than 35 KG/m^2? Yes    A Age over 17 years old? No    N Neck circumference > 16 inches (40cm)?     G Gender: Female? No    STOPBANG Total Score 1         Risk Level (based only on STOPBANG) Low - Incomplete                        Anesthesia Assessment and Plan  ASA 3       Anesthesia Plan  Anesthesia Type: epidural  Invasive Monitors/Vascular Access: none    Anesthesia Potential Complication Discussion  At increased or higher than average risk of: Nerve injury, Postdural Puncture Headache, Back pain and Pain    Informed Consent for Anesthesia  Consent obtained from patient    Interpreter: N/A - patient/guardian's preferred language is English        Epidural Anesthesia Consent:  As part of obtaining consent for epidural anesthesia, the following was discussed in detail with the patient (or their representative, as documented below):     1.  The role of the epidural anesthetic within the overall anesthetic plan and alternative analgesic approaches  2.  How the epidural anesthetic will be performed  3.  Potential risks including, but not limited to:      a. lack of efficacy          i. possible need for additional anesthetics, such general anesthesia or sedation if epidural used for procedural anesthesia         ii. possible need for alternative analgesics      b. leakage of spinal fluid causing headache      c. bleeding      d. infection      e. nerve damage which could result in permanent loss of sensation, loss of strength, and other neurologic deficits.   4. Potential benefits, including, but not limited to:      a.improved pain control  5. Potential alternatives, including, but not limited to:      a. reliance on systemic analgesics    Following this discussion, including the opportunity to ask questions, the patient (or their representative, as documented below) opted to proceed with the placement of the epidural catheter for anesthesia and/or pain management.        Consent granted for anesthetic plan - Plan: No contraindications for neuraxial anesthesia. Patient counseled on pain management during labor and r/b/a to epidural. Also, we offered reassurance about high BMI not being a contraindication to epidural  placement. Patient is interested in epidural placement and will coordinate with her once she starts labor for placement.     (See Anesthesia Record for attending attestation)    [Please note, smart link data included in this note may not reflect changes  since note creation. Please see appropriate section of APeX for up-to-the minute information.]

## 2023-04-28 NOTE — Telephone Encounter (Signed)
Population Health Peripartum Remote Blood Pressure Management Program     agrees to participate in program.   Blood pressure cuff will be mailed out to patient at the following address: [address] [facility] [address]  [place] [address]  Blood Pressure device: [employer]      Patient Education  Patient understands that she  must be the only person using the device.  Patient is aware that it is not for emergency response. Data submitted is not monitored 24/7.  Patient understands to present to [facility] Triage or nearest Emergency Department (if not local to [facility]) with any of the following:  systolic blood pressure>160 or diastolic blood pressure>110, regardless of symptoms  Blood pressure is <80/50 and experiencing symptoms of low blood pressure  Blood pressure cuff set up information and pre-enrollment [employer] survey was also sent via MyChart to patient. Patient to call [phone] if experiencing any difficulties with blood pressure cuff set up.  Patient understands that blood pressure cuff will need to be returned back to [facility] at 6-8 week Postpartum OB Office Visit     [facility]   Office of Population Health  [phone]

## 2023-04-30 MED ORDER — VALACYCLOVIR 500 MG TABLET
500 mg | ORAL_TABLET | ORAL | 3 refills | Status: DC
Start: 2023-04-30 — End: 2023-05-21

## 2023-05-01 ENCOUNTER — Ambulatory Visit
Admit: 2023-05-01 | Discharge: 2023-05-11 | Payer: MEDICAID | Attending: Physician | Primary: Student in an Organized Health Care Education/Training Program

## 2023-05-01 ENCOUNTER — Ambulatory Visit
Admit: 2023-05-01 | Payer: MEDICAID | Attending: Physician | Primary: Student in an Organized Health Care Education/Training Program

## 2023-05-01 DIAGNOSIS — O10913 Unspecified pre-existing hypertension complicating pregnancy, third trimester: Secondary | ICD-10-CM

## 2023-05-01 DIAGNOSIS — O10919 Unspecified pre-existing hypertension complicating pregnancy, unspecified trimester: Secondary | ICD-10-CM

## 2023-05-01 DIAGNOSIS — O0990 Supervision of high risk pregnancy, unspecified, unspecified trimester: Secondary | ICD-10-CM

## 2023-05-01 LAB — NST/AFI SINGLE OR FIRST GEST.(PER PROTOCOL, INCL. BPP)
AFI (cm): 20.92 cm
DEEPEST VERTICAL POCKET (CM): 6.02 cm
FHR Baseline: 125
NST Interpretation: REACTIVE

## 2023-05-01 MED ORDER — RESP SYNCYTIAL VIRUS VAC, PREF A AND B (PF) 120 MCG/0.5 ML IM SOLUTION
120 | INTRAMUSCULAR | Status: AC
Start: 2023-05-01 — End: ?

## 2023-05-01 MED ORDER — RESP SYNCYTIAL VIRUS VAC, PREF A AND B (PF) 120 MCG/0.5 ML IM SOLUTION
120 | Freq: Once | INTRAMUSCULAR | Status: AC
Start: 2023-05-01 — End: 2023-05-01
  Administered 2023-05-01: 22:00:00 0 mL via INTRAMUSCULAR

## 2023-05-01 MED FILL — ABRYSVO (PF) 120 MCG/0.5 ML INTRAMUSCULAR SOLUTION: 120 120 mcg/0.5 mL | INTRAMUSCULAR | Qty: 0.5

## 2023-05-01 NOTE — Patient Instructions (Signed)
Congratulations in advance on the birth of your baby!     We are planning an induction of labor in week 38  Reason for your induction: chronic hypertension  Your due date: Estimated Date of Delivery: 05/12/23     Videos to watch to get prepared for your induction:  "Induction of Labor: What to Expect"  [email or web address]    More education videos are available here:  Zooming through Pregnancy to Parenthood:  [email or web address]    More details about the induction:  Location: the [facility] Center at [facility], [facility], [address], [address]rd [address]. Please call prior to coming in to check for any schedule updates [phone]). There is a rare chance that the induction time may be delayed due to emergencies. We appreciate your patience in this unlikely scenario.     You may be more comfortable during your induction if you bring books or magazines to read, movies to watch, your own pillow and comfortable clothing. You may also want to remind your loved ones that although baby will be arriving soon, it could take a few days!    Please note that [employer] is an academic institution. We are fortunate to have excellent Resident and Fellow doctors who work alongside supervising Attending doctors which make your care safer. There may also be medical students present. We also have a strong midwifery program, so your birth may be attended by a Certified Nurse Midwife (CNM). If you are involved in one of our many research protocols, please be sure to let us know.      Heres what to expect:    You might be admitted to a non-labor room on labor & delivery in the beginning of your induction.  The midwives and doctors on-call when you are admitted (the team) will review your medical history, perform a physical examination (including an examination of your cervix) and evaluate your babys heartbeat.   We will place a small balloon (a "foley balloon") in  your cervix to help ripen the cervix.  We will also start misoprostol*, a small pill taken by mouth every 2 hours, to help ripen the cervix as well.  Once your balloon comes out, we will check your cervix to ensure that it worked properly and your cervix is a few centimeters dilated.  Rarely, a 2nd balloon may be needed.  If all is well, and the baby's head is pressing firmly on the cervix ("well applied"), we will recommend opening your bag of waters.  Opening the bag of water soon after the balloon comes out has been shown to decrease the amount of time you are in labor; it does not increase your chance of having an infection or a cesarean.  We recommend getting up & moving around during this stage of labor; people who spend more time upright & moving have faster labors on average!  Stay nourished & hydrated, eating light foods that you know you digest well.  If you are not already having regular contractions, or they don't begin shortly after the water bag is open, we will recommend starting pitocin to help start labor  Most people will have started labor within 24 hours of starting pitocin and opening the bag of water.  Once labor starts, its length can vary a lot person to person.  Most people who have outpatient cervical ripening have their baby in the first 24 hours they are in the hospital (especially if we're able to open the water bag that first  morning).  However, some people's labors are slower.  If you and the baby are well, we believe in a patient approach and giving more time and support to see you through to a vaginal birth if possible.    Of course, your care will be individualized based on your medical needs and desires.  We will review all options with you. The team will watch you closely to evaluate how you and your baby respond to the medications used.     Medications that may be used include Dinoprostone*, Misoprostol* or Pitocin. Dinoprostone and Misoprostol are forms of chemicals called  prostaglandins which are produced naturally by the body. These medications are used to ripen the cervix and can be inserted into the vagina or taken by mouth. Pitocin is a lab made version of oxytocin, which the body releases naturally to cause the uterus to contract, and it can be given by IV.     *Misoprostol and dinoprostone will not be used for patients who have had a uterine surgery, like a cesarean, in the past

## 2023-05-01 NOTE — Progress Notes (Signed)
ID check x 2. RSV VIS (06/12/22) offered to pt. RSV vaccine administered IM into pt's left deltoid. Pt tolerated injection well without difficulty and left stable.      PHQ-9 Score  PHQ-9 Total Score: (!) 10  If you checked off any problems, how difficult have these problems made it for you to do your work, take care of things at home, or get along with other people?: Somewhat difficult  PHQ-9 Status: Complete     GAD-7 Score  GAD-7 Total Score: 9  If you checked off any problems, how difficult have these problems made it for you to do your work, take care of things at home, or get along with other people?: Somewhat difficult

## 2023-05-01 NOTE — Progress Notes (Signed)
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 depression and anxiety sx      Anxiety and depression 11/22/2012     Longstanding issue. Does endorse having anxiety attacks with tachycardia/palpitations though notes sometimes the cardiac symptoms seem to precede anxiety (see separate problem). Previously on Lexapro- discontinued in pregnancy after counseling about R/B/A. Treating with marijuana- smoking ~2 times a day- are of potential associations. Uses family and faith/prayer to cope. Declines referral to therapy, including to to [facility] Clinical Psychologist Dr. Camillo Flaming as of 12/12/22.    Stopped smoking marijuana  Visit w/ Dr. Mena Pauls in pregnancy    Plan:  -Restart lexapro during postpartum hospitalization - see separate problem for dosing  -At risk of postpartum anxiety/depression

## 2023-05-01 NOTE — Progress Notes (Signed)
Patient seen in [facility].

## 2023-05-04 LAB — STREPTOCOCCUS GROUP B CULTURE

## 2023-05-07 NOTE — Progress Notes (Signed)
Office of Population Health Peripartum Remote Blood Pressure Management Program     Blood pressure readings in the patient flowsheet are reviewed twice a week to identify any values that did not trigger an inbasket notification, but may warrant further follow up.    Send a high priority staff message to P OB HIGH RISK CLINICAL for OB RN to call patient if:   []  Patient has not had a previous diagnosis of hypertension (chronic hypertension, gestational hypertension, preeclampsia, or superimposed preeclampsia) AND has even one mildly elevated BP: Single SBP of >=140 or DBP >=90. OB RN to follow OB clinic's current clinical protocol w/ in 72 hours.       Schedule an appointment w/ High Risk NP Lequita Asal) or with Dr. Eugenie Birks for [facility] or [facility] patients unless patient is seeing an MFM or High Risk NP (ie: scheduled in the COM DEP) or MD within 1 week if any of the following occur:  *unless patient has upcoming appt with a Maternal Fetal Medicine provider (will show as scheduled in the COM DEP)    [x]  >50% of recorded BPs are SBP >=135 or DBP >=85 (for patients with [facility] or diagnosis of HDP not on meds): appt w/in 1 week, to consider starting medications   []  Two occasions of SBP of >=150 or DBP >=100 (for patients with CHTN or diagnosis of HDP): appt w/in 1 week, to consider starting/adjusting medications   []  Any triage visit or admission related to HTN (whether CHTN or HDP): appt w/in 1 week (as a follow-up visit)   []  Monthly for patients on antihypertensive medications   []  Any patient who reached out with questions that the [facility] team cant answer   []  Postpartum at 1-2 weeks   []  Postpartum at 5-6 weeks, prior to discontinuation of program       No Further Action Needed For Now   []  No concerning BP readings upon review.   []  No BPs since last chart review. If no reading for >7 days, patient will receive automatic reminder via MyChart.   []  No BPs since last chart review. If no BP reading  for >14 days, health care navigator will follow up with patient.     []  Other: RN Notes: escalating to navigator for appt       Vassie Moselle, RN  [place] [facility] [employer]  [phone]

## 2023-05-07 NOTE — Telephone Encounter (Signed)
Population Health Peripartum Remote Blood Pressure Management Program     PATIENT OUTREACH  A voicemail was left and a phone call was made to inform the patient that the RN observed blood pressure >50% of recorded BPs are SBP>135 or DBP>85: Need to schedule BP check appointment within 1 week      Actions taken:  HCN just spoke with the patient, and she mentioned that the BP reading on 9/9 was from a cuff she purchased on [employer] and she entered manually, which showed elevated readings. She recently received the cuff we provided and wants to sync it up and use that one to see if her BP is still elevated. However, she doesnt want to schedule any additional appointments right now since shes being induced on 9/17. She also mentioned that her BP is usually within the normal range at the hospital or clinic, so shes unsure why its elevated at home. I suggested she bring the cuff she bought to the clinic to have it checked, and for now, well sync the new one and monitor from there.    Patient Reported Last 5 Readings Systolic Blood Pressure Diastolic Blood Pressure Pulse Location   05/04/2023   5:01 PM 143 mmhg 90 mmhg 88 bpm Left Arm   02/27/2023   8:54 AM 123 mmhg 82 mmhg     01/12/2023   7:53 AM 124 mmhg 76 mmhg 59 bpm    11/12/2022   8:41 AM 120 mmhg 73 mmhg 71 bpm    05/10/2020   2:17 PM 117 mmhg 62 mmhg 90 bpm      [email or web address]      FOLLOW-UP  [x]  Scheduled BP Check NP:  PATIENT DECLINED  [x]  Awaiting the patient's callback to confirm if she synced BP Monitor and new blood pressure readings from BP Monitor we sent patient

## 2023-05-11 NOTE — Progress Notes (Signed)
Office of Population Health Peripartum Remote Blood Pressure Management Program     Blood pressure readings in the patient flowsheet are reviewed twice a week to identify any values that did not trigger an inbasket notification, but may warrant further follow up.    Send a high priority staff message to P OB HIGH RISK CLINICAL for OB RN to call patient if:   []  Patient has not had a previous diagnosis of hypertension (chronic hypertension, gestational hypertension, preeclampsia, or superimposed preeclampsia) AND has even one mildly elevated BP: Single SBP of >=140 or DBP >=90. OB RN to follow OB clinic's current clinical protocol w/ in 72 hours.       Schedule an appointment w/ High Risk NP Lequita Asal) or with Dr. Eugenie Birks for [facility] or [facility] patients unless patient is seeing an MFM or High Risk NP (ie: scheduled in the COM DEP) or MD within 1 week if any of the following occur:  *unless patient has upcoming appt with a Maternal Fetal Medicine provider (will show as scheduled in the COM DEP)    []  >50% of recorded BPs are SBP >=135 or DBP >=85 (for patients with [facility] or diagnosis of HDP not on meds): appt w/in 1 week, to consider starting medications   []  Two occasions of SBP of >=150 or DBP >=100 (for patients with CHTN or diagnosis of HDP): appt w/in 1 week, to consider starting/adjusting medications   []  Any triage visit or admission related to HTN (whether CHTN or HDP): appt w/in 1 week (as a follow-up visit)   []  Monthly for patients on antihypertensive medications   []  Any patient who reached out with questions that the [facility] team cant answer   []  Postpartum at 1-2 weeks   []  Postpartum at 5-6 weeks, prior to discontinuation of program       No Further Action Needed For Now   []  No concerning BP readings upon review.   [x]  No BPs since last chart review. If no reading for >7 days, patient will receive automatic reminder via MyChart.   []  No BPs since last chart review. If no BP reading  for >14 days, health care navigator will follow up with patient.     [x]  Other: RN Notes: scheduled for induction 05/12/23       Vassie Moselle, RN  [place] [facility] [employer]  [phone]

## 2023-05-12 DIAGNOSIS — Z3490 Encounter for supervision of normal pregnancy, unspecified, unspecified trimester: Secondary | ICD-10-CM

## 2023-05-12 DIAGNOSIS — O1002 Pre-existing essential hypertension complicating childbirth: Secondary | ICD-10-CM

## 2023-05-12 MED ORDER — LIDOCAINE (PF) 10 MG/ML (1 %) INJECTION SOLUTION
10 | INTRAMUSCULAR | Status: DC | PRN
Start: 2023-05-12 — End: 2023-05-14

## 2023-05-12 MED ORDER — FENTANYL (PF) 50 MCG/ML INJECTION SOLUTION
50 | INTRAMUSCULAR | Status: AC
Start: 2023-05-12 — End: 2023-05-13

## 2023-05-12 MED ORDER — FENTANYL (PF) 50 MCG/ML INJECTION SOLUTION
50 | INTRAMUSCULAR | Status: DC | PRN
Start: 2023-05-12 — End: 2023-05-14
  Administered 2023-05-13 (×3): 50 ug via INTRAVENOUS

## 2023-05-12 MED ORDER — SODIUM CHLORIDE 0.9 % (FLUSH) INJECTION SYRINGE
0.9 | Freq: Two times a day (BID) | INTRAMUSCULAR | Status: DC
Start: 2023-05-12 — End: 2023-05-14
  Administered 2023-05-13: 07:00:00 3 mL via INTRAVENOUS

## 2023-05-12 MED ORDER — SODIUM CHLORIDE 0.9 % INTRAVENOUS PIGGYBACK
0.9 | INTRAVENOUS | Status: DC
Start: 2023-05-12 — End: 2023-05-14
  Administered 2023-05-13 – 2023-05-14 (×7): 1 g via INTRAVENOUS

## 2023-05-12 MED ORDER — OXYTOCIN 10 UNIT/ML INJECTION SOLUTION
10 | Freq: Once | INTRAMUSCULAR | Status: DC | PRN
Start: 2023-05-12 — End: 2023-05-14

## 2023-05-12 MED ORDER — MISOPROSTOL 25 MCG ORAL QUARTER-TAB
25 | ORAL | Status: AC
Start: 2023-05-12 — End: 2023-05-13
  Administered 2023-05-13 (×5): 25 ug via ORAL

## 2023-05-12 MED ORDER — LOPERAMIDE 2 MG CAPSULE
2 | Freq: Once | ORAL | Status: DC | PRN
Start: 2023-05-12 — End: 2023-05-14

## 2023-05-12 MED ORDER — LACTATED RINGERS INTRAVENOUS SOLUTION
INTRAVENOUS | Status: DC
Start: 2023-05-12 — End: 2023-05-14

## 2023-05-12 MED ORDER — MISOPROSTOL 200 MCG TABLET
200 | Freq: Once | ORAL | Status: DC | PRN
Start: 2023-05-12 — End: 2023-05-14

## 2023-05-12 MED ORDER — SODIUM CHLORIDE 0.9 % INTRAVENOUS PIGGYBACK
0.9 | Freq: Once | INTRAVENOUS | Status: AC
Start: 2023-05-12 — End: 2023-05-13
  Administered 2023-05-13: 18:00:00 2 g via INTRAVENOUS

## 2023-05-12 MED ORDER — OXYTOCIN 30 UNIT/500 ML IN 0.9 % SODIUM CHLORIDE INTRAVENOUS
30 | INTRAVENOUS | Status: DC | PRN
Start: 2023-05-12 — End: 2023-05-14

## 2023-05-12 MED ORDER — NIFEDIPINE ER 30 MG TABLET,EXTENDED RELEASE 24 HR
30 | Freq: Every day | ORAL | Status: DC
Start: 2023-05-12 — End: 2023-05-12

## 2023-05-12 MED ORDER — HYDROXYZINE HCL 25 MG/ML INTRAMUSCULAR SOLUTION
25 | Freq: Once | INTRAMUSCULAR | Status: AC
Start: 2023-05-12 — End: 2023-05-13
  Administered 2023-05-13: 08:00:00 15 mg via INTRAMUSCULAR

## 2023-05-12 MED ORDER — NIFEDIPINE ER 30 MG TABLET,EXTENDED RELEASE 24 HR
30 | Freq: Every day | ORAL | Status: DC
Start: 2023-05-12 — End: 2023-05-14
  Administered 2023-05-13: 16:00:00 30 mg via ORAL

## 2023-05-12 MED ORDER — CARBOPROST TROMETHAMINE 250 MCG/ML INTRAMUSCULAR SOLUTION
250 | INTRAMUSCULAR | Status: DC | PRN
Start: 2023-05-12 — End: 2023-05-14

## 2023-05-12 MED ORDER — SODIUM CHLORIDE 0.9 % INTRAVENOUS PIGGYBACK
0.9 | Freq: Two times a day (BID) | INTRAVENOUS | Status: DC | PRN
Start: 2023-05-12 — End: 2023-05-14

## 2023-05-12 MED ORDER — SODIUM CHLORIDE 0.9 % (FLUSH) INJECTION SYRINGE
0.9 | INTRAMUSCULAR | Status: DC | PRN
Start: 2023-05-12 — End: 2023-05-14
  Administered 2023-05-13: 15:00:00 3 mL via INTRAVENOUS

## 2023-05-12 MED ORDER — CALCIUM 200 MG (AS CALCIUM CARBONATE 500 MG) CHEWABLE TABLET
500 | Freq: Four times a day (QID) | ORAL | Status: DC | PRN
Start: 2023-05-12 — End: 2023-05-14

## 2023-05-12 MED ORDER — LOPERAMIDE 2 MG CAPSULE
2 | Freq: Four times a day (QID) | ORAL | Status: DC | PRN
Start: 2023-05-12 — End: 2023-05-14

## 2023-05-12 MED ORDER — MORPHINE 10 MG/ML INTRAVENOUS SOLUTION
10 | Freq: Once | INTRAVENOUS | Status: AC
Start: 2023-05-12 — End: 2023-05-13
  Administered 2023-05-13: 08:00:00 10 mg via INTRAMUSCULAR

## 2023-05-12 MED ORDER — TERBUTALINE 1 MG/ML SUBCUTANEOUS SOLUTION
1 | SUBCUTANEOUS | Status: DC | PRN
Start: 2023-05-12 — End: 2023-05-14

## 2023-05-12 MED ORDER — LIDOCAINE 4 % TOPICAL CREAM
4 | TOPICAL | Status: DC | PRN
Start: 2023-05-12 — End: 2023-05-14

## 2023-05-12 MED ORDER — ALUMINUM-MAG HYDROXIDE-SIMETHICONE 200 MG-200 MG-20 MG/5 ML ORAL SUSP
200-200-20 | Freq: Three times a day (TID) | ORAL | Status: DC | PRN
Start: 2023-05-12 — End: 2023-05-14

## 2023-05-12 MED ORDER — ONDANSETRON HCL (PF) 4 MG/2 ML INJECTION SOLUTION
4 | Freq: Four times a day (QID) | INTRAMUSCULAR | Status: DC | PRN
Start: 2023-05-12 — End: 2023-05-14
  Administered 2023-05-14: 22:00:00 4 mg via INTRAVENOUS

## 2023-05-12 MED ORDER — METHYLERGONOVINE 0.2 MG/ML (1 ML) INJECTION SOLUTION
0.2 | Freq: Once | INTRAMUSCULAR | Status: DC | PRN
Start: 2023-05-12 — End: 2023-05-14

## 2023-05-12 NOTE — Progress Notes (Signed)
OBSTETRICS LABOR PROGRESS NOTE     Subjective  Ready for FB  Received fentanyl IV prior to exam    Vitals  Temp:  [36.7 C] 36.7 C  Heart Rate:  [68-75] 68  *Resp:  [16] 16  BP: (138)/(66) 138/66  SpO2:  [99 %] 99 %    Physical Examination    Cervix:  *Dilation: Closed  *Effacement (cm): >2.5  *Station: -3    Membrane Status:   intact    Fetal Heart Rate Tracing:  Baseline rate:  115  Variability:  moderate  FHR Changes:  accels present    Tocometer:    No contractions    Data  Labs Reviewed and Significant for:  CBC        05/12/23  2150   WBC 11.0*   HGB 10.7*   HCT 31.9*   PLT 302        Assessment and Plan  Kayla Frey is a 37 y.o. G1P0000 at [redacted]w[redacted]d a/f scheduled IOL for cHTN, now with 60cc FB in place and starting first miso dose.    Pt tolerated exam well with fentanyl 50 IV. Asking for recs for further pain control as would like to be comfortable overnight if possible. Discussed morphine sleep often preferred by pts who would like to sleep a few hours with FB in; pt says had one episode of emesis after receiving morphine years ago though no respiratory sx/rash/hives/vital sign changes that would suggest true allergy. Suspect was mild n/v as side effect to opiates rather than true allergy, and pt tolerated fentanyl well while in room. Pt would like to try morphine sleep, discussed can give Zofran if nauseous.    Check-in in 4h or sooner PRN and continue miso per protocol.    * IOL cHTN  Assessment & Plan  Labor:  05/12/23   2100 admitted   2315 c/l/h, 60cc FB placed    Fetal Well Being:   Cat I       Pain Control:  Open, eventually likely desires epidural    Mode of Delivery:  Expect NSVD      cHTN  Assessment & Plan  Since 2022, BP of 130-150/70-90. MRBPs at initiation of PN care 09/2022, then 12/2022 elevation to 160/68 in clinic, started on nifed 30mg  XL daily. At goal BP since. Pre-E labs normal.     Depression  Assessment & Plan  Previously on Lexapro 30, paused during pregnancy w/ plan to restart  PP    HSV  Assessment & Plan  Prior +HSV serology w/o hx of any outbreaks. On Valtrex since 36w. No prodrome, no lesions on admission SSE.    Hx lsc sleeve gastrectomy  Assessment & Plan  In 2021 w/ subsequent 50lb weight loss    BMI 57  Assessment & Plan  [ ]  awareness        Kayla Frey "Eli Hose, MD  Resident Physician, PGY-1  Obstetrics, Gynecology, & Reproductive Sciences  [facility] [place], [place]    05/12/23

## 2023-05-12 NOTE — Assessment & Plan Note (Addendum)
[ ]   awareness

## 2023-05-12 NOTE — Assessment & Plan Note (Addendum)
Labor:  05/12/23   2100 admitted   2315 c/l/h, 60cc FB placed  05/13/23   0342 miso x3    0920 3/2/-2 s/p miso x 5. FB out. Pitocin tarted   1640 4/1/-1, AROM to clears, FSE, IUPC placed  05/14/23   0154 4/1/-2   8841 5/1/-1   0910 5/1/-2, 3 min decel (resolved w position changes and pit off)   1400 8/<0.5/-1   1500 9/<0.5/-1    Fetal Well Being:   Cat II intermittent variables baseline 130s moderate variability       Pain Control:  Epidural    Mode of Delivery:  Expect NSVD

## 2023-05-12 NOTE — Assessment & Plan Note (Deleted)
Per Dr. Delsa Grana note, review .OBTRAUMAINFORMEDDELIVERYCHECKLISTPROVIDER with patient

## 2023-05-12 NOTE — Assessment & Plan Note (Addendum)
Prior +HSV serology w/o hx of any outbreaks. On Valtrex since 36w. No prodrome, no lesions on admission SSE.

## 2023-05-12 NOTE — H&P (Addendum)
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 for pregnancy    blood-glucose (ACCU-CHEK GUIDE ME GLUCOSE MTR) monitoring kit CHECK BLOOD SUGAR FOUR TO SIX TIMES DAILY OR AS DIRECTED    cholecalciferol, vitamin D3, 1000 UNITS tablet Take 2 tablets (2,000 Units total) by mouth daily    cyanocobalamin, Vitamin B12, 1,000 mcg tablet Take 2 tablets (2,000 mcg total) by mouth daily    ferrous sulfate 325 mg (65 mg elemental) tablet Take 1 tablet (325 mg total) by mouth every other day    NIFEdipine (PROCARDIA XL) 30 mg 24 hr tablet Take 1 tablet (30 mg total) by mouth daily    prenatal vitamin-iron fumarate-folic acid 28 mg iron- 800 mcg tablet Take 1 tablet by mouth daily    valACYclovir (VALTREX) 500 mg tablet TAKE 1 TABLET(500 MG) BY MOUTH DAILY FOR SUPPRESSION    aspirin 81 mg EC tablet Take 2 tablets (162 mg total) by mouth nightly at bedtime (Patient not taking: Reported on 04/16/2023)    biotin 10,000 mcg TABRAPID Take 1 tablet by mouth (Patient not taking: Reported on 04/16/2023)    calcium carbonate (CALCIUM 600 ORAL) Take 600 mg by mouth Pt reports to be consuming 2 tablets/ day for a total of 1200 mg (Patient not taking: Reported on 04/16/2023)  empty container (SHARPS CONTAINER) MISC Sharps Container: Use as directed to discard insulin needles and lancets with each use.    escitalopram oxalate (LEXAPRO) 20 mg tablet Take 1.5 tablets (30 mg total) by mouth daily (Patient not taking: Reported on 04/16/2023)    lancets lancets Check blood sugar 4-6 times daily or as directed.    lansoprazole  (PREVACID) 30 mg capsule Take 1 capsule (30 mg total) by mouth daily (Patient not taking: Reported on 04/16/2023)    multivitamin-min-iron-FA-vit K (PROCARE BARIATRIC) 45 mg iron- 800 mcg-120 mcg capsule Take 1 capsule by mouth nightly at bedtime (Patient not taking: Reported on 04/16/2023)    propranoloL (INDERAL) 10 mg tablet TAKE 1 TABLET(10 MG) BY MOUTH DAILY AS NEEDED FOR ANXIETY (Patient not taking: Reported on 04/16/2023)       Social History     Socioeconomic History    Marital status: Single   Tobacco Use    Smoking status: Former     Current packs/day: 0.00     Types: Cigarettes     Start date: 09/14/2014     Quit date: 12/30/2019     Years since quitting: 3.3    Smokeless tobacco: Never    Tobacco comments:     taking chantix to help with smoking cessation   Substance and Sexual Activity    Alcohol use: Not Currently     Comment: has used socially in past; no history of problem use    Drug use: Never     Comment: no history of Rx med abuse or illicit substance use    Sexual activity: Yes     Partners: Male     Birth control/protection: None     Comment: Pt denies pregnancy.  No activity for at least 1 mont     Social Determinants of Health     Financial Resource Strain: Not on File (12/21/2022)    Received from [employer], [employer]     Financial Resource Strain: 0   Transportation Needs: Not on File (12/21/2022)    Received from [employer], [employer] Needs     Transportation: 0   Housing Stability: Not on File (12/21/2022)    Received from [employer], [employer] Stability     Housing: 0          Family History   Problem Relation Name Age of Onset    Mental health problem Mother Mom         unknown type    Drug abuse Mother Mom         crack cocaine    Ovarian cysts Mother Mom     Cancer - colon Father Dad 34        d.58, liver failure, worked on planes for the [employer] blood pressure Father Dad     Drug abuse Father Dad         crack cocaine    Colon cancer Father Dad      Heart disease Father Dad     Depression Brother Brother     Anxiety disorder Brother Brother     Mental health problem Brother Brother         possible bipolar disorder    Alcoholism Brother Brother     Asthma Brother Brother     Suicidality Brother Brother         attempted suicide    Early death Paternal Caryn Bee  Obesity Paternal Caryn Bee     Colon cancer Paternal Uncle archie 55    Cancer - lung Maternal Grandmother Jo ann         in 50s-60s, had pneumonia    Alcoholism Maternal Grandmother Jo ann     Cancer Maternal Grandmother Marvia Pickles     Mental health problem Maternal Grandmother Alvino Chapel ann         unknown type    Cervical cancer Maternal Grandmother Marvia Pickles         unknown age    Colon cancer Paternal Grandfather Jari Favre 24        [employer]    Miscarriages / [place] Cousin      Eclampsia Cousin      Cervical cancer Cousin          from HPV    Cervical cancer Maternal Great-Grandmother      Anesth problems Neg Hx         Review of Systems  Constitutional: Negative for fever and chills.   Eyes: Negative for blurred vision.   Respiratory: Negative for cough and shortness of breath.    Cardiovascular: Negative for chest pain and palpitations.   Gastrointestinal: Negative for abdominal pain.   Genitourinary: Negative for dysuria.  Skin: Negative for rash.   Neurological: Negative for dizziness.   Endo/Heme/Allergies: Does not bruise/bleed easily.   Psychiatric/Behavioral: Negative for depression.   All other systems reviewed and are negative.    Vitals  Temp:  [36.7 C] 36.7 C  Heart Rate:  [68-75] 68  *Resp:  [16] 16  BP: (138)/(66) 138/66  SpO2:  [99 %] 99 %  Height: 170.2 cm    Physical Examination  General: well-appearing, NAD  Head: NCAT.  Eyes: Conjunctivae and EOM are normal.  Neck: Normal range of motion. Neck supple.   Cardiovascular: Normal rate  Pulmonary/Chest: normal WOB  Abdominal: soft, nontender, uterus gravid.  Musculoskeletal: Normal range of motion.   Neurological: A&Ox3.   Skin: Skin is  warm and dry.   Psychiatric: She has appropriate mood and affect. Her behavior is normal. Judgment and thought content normal.      Cervix:  *Dilation: Closed  *Effacement (cm): >2.5  *Station: -3    Membrane Status:   intact    Fetus Presentation:  cephalic, DVP 2.54cm    Fetal Heart Rate Tracing:  Baseline rate:  115  Variability:  moderate  FHR Changes:  accels present    Tocometer:        Data  CBC        05/12/23  2150   WBC 11.0*   HGB 10.7*   HCT 31.9*   PLT 302       Problem-based Assessment & Plan    Hayley Kierstead is a 37 y.o. G1P0000 at [redacted]w[redacted]d admitted for scheduled IOL for cHTN. Presents w/ supportive partner Antwane, Aunt Meriam Sprague and Alvy Beal at bedside.    No s/sx of preE. Well-controlled pressures on nifed 30mg  daily (started 12/2022).     Nervous for induction process but feels well-counseled overall. Has birth plan that asks to decrease/consolidate # of vaginal exams when possible and have detailed explanations/detailed informed consent prior to procedures.    No hx of HSV outbreaks, only positive serology test prior to this pregnancy approx 08/2022. Appeared to be counseled in outpatient setting and patient opted in to taking Valtrex starting 36w. Plan for SSE and if no lesions, FB placement and initiation of miso.    *  IOL cHTN  Assessment & Plan  Labor:  05/12/23   2100 admitted    Fetal Well Being:   Cat I     Pain Control:  Open, eventually likely desires epidural    Mode of Delivery:  Expect NSVD      cHTN  Assessment & Plan  Since 2022, BP of 130-150/70-90. MRBPs at initiation of PN care 09/2022, then 12/2022 elevation to 160/68 in clinic, started on nifed 30mg  XL daily. At goal BP since. Pre-E labs normal.     Depression  Assessment & Plan  Previously on Lexapro 30, paused during pregnancy w/ plan to restart PP    HSV  Assessment & Plan  Prior +HSV serology w/o hx of any outbreaks. On Valtrex since 36w. No prodrome, no lesions on admission SSE.    Hx lsc sleeve gastrectomy  Assessment &  Plan  In 2021 w/ subsequent 50lb weight loss    BMI 57  Assessment & Plan  [ ]  awareness      Kayla Frey "Eli Hose, MD  Resident Physician, PGY-1  Obstetrics, Gynecology, & Reproductive Sciences  [facility], [place]    05/13/23

## 2023-05-12 NOTE — Assessment & Plan Note (Addendum)
Previously on Lexapro 30, paused during pregnancy w/ plan to restart PP

## 2023-05-12 NOTE — Plan of Care (Signed)
Patient admitted for IOL for GHTN  Pt denies any headache  no visual disturbances no epigastric pain   DTR +2  No clonus  Plan of care discussed  with patient about plan of care  pt verbalizes understanding

## 2023-05-12 NOTE — Assessment & Plan Note (Signed)
Since 2022, BP of 130-150/70-90. MRBPs at initiation of PN care 09/2022, then 12/2022 elevation to 160/68 in clinic, started on nifed 30mg  XL daily. At goal BP since. Pre-E labs normal.

## 2023-05-12 NOTE — Assessment & Plan Note (Signed)
In 2021 w/ subsequent 50lb weight loss

## 2023-05-13 ENCOUNTER — Inpatient Hospital Stay
Admit: 2023-05-13 | Discharge: 2023-05-16 | Disposition: A | Discharge status: Home / Self Care | Payer: MEDICAID | Attending: Physician | Admitting: Physician

## 2023-05-13 LAB — COMPLETE BLOOD COUNT
Hematocrit: 31.9 % — ABNORMAL LOW (ref 36.0–46.0)
Hemoglobin: 10.7 g/dL — ABNORMAL LOW (ref 12.0–15.5)
MCH: 30.6 pg (ref 26.0–34.0)
MCHC: 33.5 g/dL (ref 31.0–36.0)
MCV: 91 fL (ref 80–100)
MPV: 10.6 fL (ref 9.1–12.6)
Platelet Count: 302 10*9/L (ref 140–450)
RBC Count: 3.5 10*12/L — ABNORMAL LOW (ref 4.00–5.20)
RDW-CV: 13.3 % (ref 11.7–14.4)
WBC Count: 11 10*9/L — ABNORMAL HIGH (ref 3.4–10.0)

## 2023-05-13 LAB — CREATININE, SERUM / PLASMA
Creatinine: 0.77 mg/dL (ref 0.55–1.02)
eGFRcr: 102 mL/min/{1.73_m2} (ref >59)

## 2023-05-13 LAB — ALANINE TRANSAMINASE: Alanine transaminase: 9 U/L — ABNORMAL LOW (ref 10–61)

## 2023-05-13 LAB — TYPE AND SCREEN
ABO/RH(D): O POS
Antibody Screen: NEGATIVE

## 2023-05-13 LAB — SYPHILIS SCREEN BY RPR WITH REFLEX TO TREPONEMAL ANTIBODY: RPR: NONREACTIVE

## 2023-05-13 LAB — ASPARTATE TRANSAMINASE: AST: 15 U/L (ref 5–44)

## 2023-05-13 MED ORDER — NALOXONE 0.4 MG/ML INJECTION SOLUTION
0.4 | INTRAMUSCULAR | Status: DC | PRN
Start: 2023-05-13 — End: 2023-05-16

## 2023-05-13 MED ORDER — LIDOCAINE-EPINEPHRINE (PF) 1.5 %-1:200,000 INJECTION SOLUTION
1.5 | Freq: Once | INTRAMUSCULAR | Status: DC | PRN
Start: 2023-05-13 — End: 2023-05-14
  Administered 2023-05-13: 22:00:00 3 via EPIDURAL
  Administered 2023-05-14: 23:00:00 10 via EPIDURAL

## 2023-05-13 MED ORDER — LACTATED RINGERS IV BOLUS
Freq: Once | INTRAVENOUS | Status: AC
Start: 2023-05-13 — End: 2023-05-13
  Administered 2023-05-13: 22:00:00 500 mL via INTRAVENOUS

## 2023-05-13 MED ORDER — FENTANYL-ROPIVACAINE-NACL (PF) 2 MCG/ML-0.1% EPIDURAL SOLUTION
2 | EPIDURAL | Status: DC
Start: 2023-05-13 — End: 2023-05-16
  Administered 2023-05-14 (×2): 200 via EPIDURAL

## 2023-05-13 MED ORDER — BUPIVACAINE (PF) 0.5 % (5 MG/ML) INJECTION SOLUTION
5 | INTRAMUSCULAR | Status: DC | PRN
Start: 2023-05-13 — End: 2023-05-14
  Administered 2023-05-13: 22:00:00 3 via EPIDURAL
  Administered 2023-05-14: 22:00:00 10 via EPIDURAL
  Administered 2023-05-14 (×2): 5 via EPIDURAL

## 2023-05-13 MED ORDER — OXYTOCIN 30 UNIT/500 ML IN 0.9 % SODIUM CHLORIDE INTRAVENOUS
30 | INTRAVENOUS | Status: DC
Start: 2023-05-13 — End: 2023-05-14
  Administered 2023-05-13: 19:00:00 6 m[IU]/min via INTRAVENOUS
  Administered 2023-05-13: 8 m[IU]/min via INTRAVENOUS
  Administered 2023-05-13: 18:00:00 2 m[IU]/min via INTRAVENOUS
  Administered 2023-05-13: 20:00:00 8 m[IU]/min via INTRAVENOUS
  Administered 2023-05-13: 20:00:00 10 m[IU]/min via INTRAVENOUS
  Administered 2023-05-13: 19:00:00 4 m[IU]/min via INTRAVENOUS
  Administered 2023-05-14: 01:00:00 9 m[IU]/min via INTRAVENOUS
  Administered 2023-05-14: 13:00:00 8 m[IU]/min via INTRAVENOUS
  Administered 2023-05-14: 18:00:00 4 m[IU]/min via INTRAVENOUS
  Administered 2023-05-14: 09:00:00 12 m[IU]/min via INTRAVENOUS
  Administered 2023-05-14: 12:00:00 10 m[IU]/min via INTRAVENOUS
  Administered 2023-05-14: 06:00:00 9 m[IU]/min via INTRAVENOUS
  Administered 2023-05-14: 12:00:00 12 m[IU]/min via INTRAVENOUS
  Administered 2023-05-14: 20:00:00 4 m[IU]/min via INTRAVENOUS
  Administered 2023-05-14: 09:00:00 10 m[IU]/min via INTRAVENOUS
  Administered 2023-05-14: 17:00:00 2 m[IU]/min via INTRAVENOUS
  Administered 2023-05-14: 11:00:00 14 m[IU]/min via INTRAVENOUS
  Administered 2023-05-14 (×3): 6 m[IU]/min via INTRAVENOUS
  Administered 2023-05-14 (×3): 8 m[IU]/min via INTRAVENOUS
  Administered 2023-05-14: 02:00:00 7 m[IU]/min via INTRAVENOUS

## 2023-05-13 MED ORDER — ONDANSETRON HCL (PF) 4 MG/2 ML INJECTION SOLUTION
4 | Freq: Once | INTRAMUSCULAR | Status: DC | PRN
Start: 2023-05-13 — End: 2023-05-16

## 2023-05-13 MED ORDER — LACTATED RINGERS INTRAVENOUS SOLUTION
INTRAVENOUS | Status: DC
Start: 2023-05-13 — End: 2023-05-14
  Administered 2023-05-13: 18:00:00 25 mL/h via INTRAVENOUS
  Administered 2023-05-13 – 2023-05-14 (×5): 125 mL/h via INTRAVENOUS

## 2023-05-13 MED ORDER — FENTANYL-ROPIVACAINE-NACL (PF) 2 MCG/ML-0.1% EPIDURAL SOLUTION
2 | EPIDURAL | Status: DC | PRN
Start: 2023-05-13 — End: 2023-05-14
  Administered 2023-05-13: 22:00:00 12 via EPIDURAL
  Administered 2023-05-14: 19:00:00 10 via EPIDURAL

## 2023-05-13 MED ORDER — LIDOCAINE-EPINEPHRINE (PF) 1.5 %-1:200,000 INJECTION SOLUTION
1.5 | INTRAMUSCULAR | Status: AC
Start: 2023-05-13 — End: 2023-05-13

## 2023-05-13 MED FILL — MORPHINE 10 MG/ML INTRAVENOUS SOLUTION: 10 10 mg/mL | INTRAVENOUS | Qty: 1

## 2023-05-13 MED FILL — NIFEDIPINE ER 30 MG TABLET,EXTENDED RELEASE 24 HR: 30 30 mg | ORAL | Qty: 1

## 2023-05-13 MED FILL — MISOPROSTOL 25 MCG ORAL QUARTER-TAB: 25 25 mcg | ORAL | Qty: 1

## 2023-05-13 MED FILL — FENTANYL (PF) 50 MCG/ML INJECTION SOLUTION: 50 50 mcg/mL | INTRAMUSCULAR | Qty: 2

## 2023-05-13 MED FILL — AMPICILLIN 2 GRAM SOLUTION FOR INJECTION: 2 2 gram | INTRAMUSCULAR | Qty: 20

## 2023-05-13 MED FILL — ONDANSETRON HCL (PF) 4 MG/2 ML INJECTION SOLUTION: 4 mg/2 mL | INTRAMUSCULAR | Qty: 2

## 2023-05-13 MED FILL — FENTANYL-ROPIVACAINE-NACL (PF) 2 MCG/ML-0.1% EPIDURAL SOLUTION: 2 2 mcg/mL-0.1 % | EPIDURAL | Qty: 200

## 2023-05-13 MED FILL — HYDROXYZINE HCL 25 MG/ML INTRAMUSCULAR SOLUTION: 25 25 mg/mL | INTRAMUSCULAR | Qty: 1

## 2023-05-13 MED FILL — OXYTOCIN 30 UNIT/500 ML IN 0.9 % SODIUM CHLORIDE INTRAVENOUS: 30 unit/500 mL | INTRAVENOUS | Qty: 500

## 2023-05-13 MED FILL — AMPICILLIN 1 GRAM SOLUTION FOR INJECTION: 1 1 gram | INTRAMUSCULAR | Qty: 10

## 2023-05-13 MED FILL — XYLOCAINE-MPF/EPINEPHRINE 1.5 %-1:200,000 INJECTION SOLUTION: 1.5 1.5 %-1:200,000 | INTRAMUSCULAR | Qty: 10

## 2023-05-13 NOTE — Plan of Care (Signed)
Problem: Labor Support - Intrapartum Patient - OB  Goal: Effective coping  Outcome: Progress within 12 hours     Problem: Infection, Risk of - Intrapartum  Goal: Absence of infection signs and symptoms  Outcome: Progress within 12 hours     Problem: Fetal Well-being Altered, Risk of - Intrapartum Patient - OB  Goal: Fetal heart rate within specified parameters  Outcome: Progress within 12 hours     Problem: Pain Acute / Chronic - Intrapartum Patient - OB  Goal: Able to cope with pain  Outcome: Progress within 12 hours  Goal: Control of acute pain  Outcome: Progress within 12 hours  Goal: Control of chronic pain  Outcome: Progress within 12 hours     Problem: Discharge Planning - OB  Goal: Knowledge of and participation in plan of care  Outcome: Progress within 12 hours

## 2023-05-13 NOTE — Progress Notes (Signed)
OBSTETRICS LABOR PROGRESS NOTE     Subjective  Feeling well w/ epidural  Slept well last night  Supportive family at bedside    Vitals  Temp:  [36.5 C-36.8 C] 36.5 C  Heart Rate:  [62-109] 65  *Resp:  [14-20] 18  BP: (121-143)/(57-83) 138/73  SpO2:  [95 %-100 %] 100 %    Physical Examination      Cervix:  *Dilation: 4  *Effacement (cm): 1  *Station: -2    Membrane Status:  Rupture Date: 05/13/23  Rupture Time: 1605    Fetal Heart Rate Tracing:  Baseline rate: 120 bpm  Variability: Moderate  FHR Changes: Accelerations    Tocometer:   Uterine Activity  $ Mode: IUPC;Palpation  Contraction Frequency: 1.5-2  Contraction Duration: 68-95  Contraction Quality: Moderate  Resting Tone Palpated: Soft  MVU's (mmHg): 228  Resting tone (mmHg): 28-34    Data  Labs Reviewed and Significant for:  CBC        05/12/23  2150   WBC 11.0*   HGB 10.7*   HCT 31.9*   PLT 302        Assessment and Plan  Kayla Frey is a 37 y.o. G1P0000 at [redacted]w[redacted]d IOL for cHTN now s/p AROM on pit, in early labor.    IUPC and FSE placed by day team recently d/t difficulty with external monitoring. Patient feeling well, on 7 of pit and contracting q2-7min. Cat I strip w/ accels present. Will uptitrate pit with SVE PRN. Next check-in 4h or sooner PRN.    * IOL cHTN  Assessment & Plan  Labor:  05/12/23   2100 admitted   2315 c/l/h, 60cc FB placed  05/13/23   0342 miso x3    0920 3/2/-2 s/p miso x 5. FB out. Pitocin tarted   1640 4/1/-1, AROM to clears, FSE, IUPC placed    Fetal Well Being:   Cat I       Pain Control:  Open, eventually likely desires epidural    Mode of Delivery:  Expect NSVD      cHTN  Assessment & Plan  Since 2022, BP of 130-150/70-90. MRBPs at initiation of PN care 09/2022, then 12/2022 elevation to 160/68 in clinic, started on nifed 30mg  XL daily. At goal BP since. Pre-E labs normal.     Depression  Assessment & Plan  Previously on Lexapro 30, paused during pregnancy w/ plan to restart PP    +HSV2 serology  Assessment & Plan  Prior +HSV  serology w/o hx of any outbreaks. On Valtrex since 36w. No prodrome, no lesions on admission SSE.    Hx lsc sleeve gastrectomy  Assessment & Plan  In 2021 w/ subsequent 50lb weight loss    BMI 57  Assessment & Plan  [ ]  awareness        Kayla Stigger "Sei" Lenise Herald, MD  Resident Physician, PGY-1  Obstetrics, Gynecology, & Reproductive Sciences  [facility], [place]    05/13/23

## 2023-05-13 NOTE — Progress Notes (Signed)
Population Health flowsheet documentation only

## 2023-05-13 NOTE — Progress Notes (Signed)
OBSTETRICS LABOR PROGRESS NOTE     Subjective  Patient feeling comfortable with the epidural.     Vitals  Temp:  [36.7 C-36.8 C] 36.8 C  Heart Rate:  [62-109] 68  *Resp:  [14-18] 18  BP: (121-143)/(57-83) 134/65  SpO2:  [98 %-100 %] 99 %    Physical Examination    Cervix:  *Dilation: 4  *Effacement (cm): 1  *Station: -2    Membrane Status:  Rupture Date: 05/13/23  Rupture Time: 1605    Fetal Heart Rate Tracing:  Baseline rate: 125 bpm  Variability: Moderate  FHR Changes: Accelerations    Tocometer:   Uterine Activity  $ Mode: Toco  Contraction Frequency: 1-1.5  Contraction Duration: 60-80  Contraction Quality: Moderate  Resting Tone Palpated: Soft    Data  Labs Reviewed and Significant for:        Assessment and Plan  Krystal Canjura is a 37 y.o. G1P0000 at [redacted]w[redacted]d - IOL. Recommendation was made for artificial rupture of membranes. Risks, benefits, alternatives, and indications were discussed. The patient verbally consented to the procedure. AROM was performed with an amniotomy hook and was uncomplicated. Fetal heart rate following AROM was Category 1. IUPC and FSE was also placed given that it also has been difficult to monitor FHT and uterine contractions externally. Check in back in around 4 hours or sooner PRN.       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 On Valtrex since 36w. No prodrome, no lesions on admission SSE.    cHTN  Assessment & Plan  Since 2022, BP of 130-150/70-90. MRBPs at initiation of PN care 09/2022, then 12/2022 elevation to 160/68 in clinic, started on nifed 30mg  XL daily. At goal BP since. Pre-E labs normal.     Hx lsc sleeve gastrectomy  Assessment & Plan  In 2021 w/ subsequent 50lb weight loss    BMI 57  Assessment & Plan  [ ]  awareness    * IOL cHTN  Assessment & Plan  Labor:  05/12/23   2100 admitted   2315 c/l/h, 60cc FB placed  05/13/23   0342  miso x3    0920 3/2/-2 s/p miso x 5. FB out. Pitocin tarted   1640 4/1/-1, AROM to clears, FSE, IUPC placed    Fetal Well Being:   Cat I       Pain Control:  Open, eventually likely desires epidural    Mode of Delivery:  Expect NSVD          Daneen Schick, MD (PGY-1)  [facility]   [phone]

## 2023-05-13 NOTE — Progress Notes (Signed)
OBSTETRICS LABOR PROGRESS NOTE     Subjective  Feeling lots of cramping and is asking for her epidural now. She would prefer it first and then proceed with AROM.    Vitals  Temp:  [36.7 C-36.8 C] 36.8 C  Heart Rate:  [62-109] 82  *Resp:  [14-18] 18  BP: (121-143)/(58-83) 135/78  SpO2:  [98 %-100 %] 98 %    Physical Examination    Cervix:  *Dilation: 3  *Effacement (cm): 2  *Station: -2    Membrane Status:       Fetal Heart Rate Tracing:  Baseline rate: 130 bpm  Variability: Moderate  FHR Changes: Accelerations    Tocometer:   Uterine Activity  $ Mode: Toco  Contraction Frequency: 1.5-3  Contraction Duration: 60-80  Contraction Quality: Moderate  Resting Tone Palpated: Soft    Data  Labs Reviewed and Significant for:        Assessment and Plan  Shynia Proske is a 37 y.o. G1P0000 at [redacted]w[redacted]d - IOL.  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 On Valtrex since 36w. No prodrome, no lesions on admission SSE.    cHTN  Assessment & Plan  Since 2022, BP of 130-150/70-90. MRBPs at initiation of PN care 09/2022, then 12/2022 elevation to 160/68 in clinic, started on nifed 30mg  XL daily. At goal BP since. Pre-E labs normal.     Hx lsc sleeve gastrectomy  Assessment & Plan  In 2021 w/ subsequent 50lb weight loss    BMI 57  Assessment & Plan  [ ]  awareness    * IOL cHTN  Assessment & Plan  Labor:  05/12/23   2100 admitted   2315 c/l/h, 60cc FB placed  05/13/23   0342 miso x3    0920 3/2/-2 s/p miso x 5. FB out. Pitocin tarted    Fetal Well Being:   Cat I       Pain Control:  Open, eventually likely desires epidural    Mode of Delivery:  Expect NSVD          Idelle Crouch Temiloluwa Lared[place], CNM   05/13/2023

## 2023-05-13 NOTE — Progress Notes (Signed)
OBSTETRICS LABOR PROGRESS NOTE     Subjective  Per RN pt resting comfortably  Received 3rd dose of miso  FB firmly in place    Vitals  Temp:  [36.7 C] 36.7 C  Heart Rate:  [62-76] 76  *Resp:  [16] 16  BP: (138-143)/(63-83) 143/63  SpO2:  [99 %-100 %] 99 %    Physical Examination    Cervix:  *Dilation: Closed  *Effacement (cm): >2.5  *Station: -3    Membrane Status:   intact    Fetal Heart Rate Tracing:  Baseline rate: 110 bpm  Variability: Moderate  FHR Changes: Accelerations    Tocometer:   Uterine Activity  $ Mode: Toco  Contraction Frequency: 3-6  Contraction Duration: 40-80  Contraction Quality: Mild  Resting Tone Palpated: Soft    Data  Labs Reviewed and Significant for:  CBC        05/12/23  2150   WBC 11.0*   HGB 10.7*   HCT 31.9*   PLT 302        Assessment and Plan  Kayla Frey is a 37 y.o. G1P0000 at [redacted]w[redacted]d here for IOL for cHTN.    With FB in place, on miso protocol. Did not wake patient up, as she was resting comfortably. Reviewed FHR tracing, cat I with multiple accels and mod variability, baseline 110. Plan to continue miso q2h, next check-in approx 0800 with day team or sooner PRN.     * IOL cHTN  Assessment & Plan  Labor:  05/12/23   2100 admitted   2315 c/l/h, 60cc FB placed  05/13/23   0342 miso x3     Fetal Well Being:   Cat I       Pain Control:  Open, eventually likely desires epidural    Mode of Delivery:  Expect NSVD      cHTN  Assessment & Plan  Since 2022, BP of 130-150/70-90. MRBPs at initiation of PN care 09/2022, then 12/2022 elevation to 160/68 in clinic, started on nifed 30mg  XL daily. At goal BP since. Pre-E labs normal.     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 On Valtrex since 36w. No prodrome, no lesions on admission SSE.    Hx lsc sleeve gastrectomy  Assessment & Plan  In 2021 w/ subsequent 50lb weight loss    BMI 57  Assessment &  Plan  [ ]  awareness        Kayla Frey "Eli Hose, MD  Resident Physician, PGY-1  Obstetrics, Gynecology, & Reproductive Sciences  [facility] [place], [place]    05/13/23

## 2023-05-13 NOTE — Anesthesia Pre-Procedure Evaluation (Signed)
[facility]  Anesthesia Preprocedure Evaluation    Procedure Information       Date/Time: 05/12/23 2030    Procedure: INDUCTION    Location: [facility]            Precautions            None            Relevant Problems   No relevant active problems         Anesthesia Encounter History        CC/HPI/Past Medical History Summary: Kayla Frey is a 37 y.o. G1P0000 at [redacted]w[redacted]d here for IOL for cHTN.    Hx sleeve gastrectomy 2021    (Please refer to [facility] Allergies, Problems, Past Medical History, Past Surgical History, Social History, and Family History activities, Results for current data from these respective sections of the chart; these sections of the chart are also summarized in reports, including the Patient Summary Extracts found in Chart Review)              Review of Systems    Physical Exam       Prepare (Pre-Operative Clinic) Assessment/Plan/Narrative      Stop Brennan Bailey information    Anesthesia Assessment and Plan  Day Of Surgery Provider Chart Review:  NPO status verified  medications reviewed  allergies reviewed  problem list reviewed  anesthesia history reviewed  pertinent labs reviewed  consults reviewed    ASA 2       Anesthesia Plan  Anesthesia Type: epidural  Invasive Monitors/Vascular Access: none    Anesthesia Potential Complication Discussion  There is the possibility of rare but serious complications.    Informed Consent for Anesthesia  Consent obtained from patient    Risks, benefits and alternatives including those of invasive monitoring discussed. Increased risks (as above) discussed.  Questions invited and all answered.  Interpreter: N/A - patient/guardian's preferred language is English        Epidural Anesthesia Consent:  As part of obtaining consent for epidural anesthesia, the following was discussed in detail with the patient (or their representative, as documented below):     1.  The role of the epidural anesthetic within the overall anesthetic plan and alternative analgesic  approaches  2.  How the epidural anesthetic will be performed  3.  Potential risks including, but not limited to:      a. lack of efficacy          i. possible need for additional anesthetics, such general anesthesia or sedation if epidural used for procedural anesthesia         ii. possible need for alternative analgesics      b. leakage of spinal fluid causing headache      c. bleeding      d. infection      e. nerve damage which could result in permanent loss of sensation, loss of strength, and other neurologic deficits.   4. Potential benefits, including, but not limited to:      a.improved pain control  5. Potential alternatives, including, but not limited to:      a. reliance on systemic analgesics    Following this discussion, including the opportunity to ask questions, the patient (or their representative, as documented below) opted to proceed with the placement of the epidural catheter for anesthesia and/or pain management.          (See Anesthesia Record for attending attestation)    [Please note, smart link  data included in this note may not reflect changes since note creation. Please see appropriate section of [facility] for up-to-the minute information.]

## 2023-05-13 NOTE — Plan of Care (Signed)
Problem: Labor Support - Intrapartum Patient - OB  Goal: Effective coping  Outcome: Progress within 12 hours     Problem: Infection, Risk of - Intrapartum  Goal: Absence of infection signs and symptoms  Outcome: Progress within 12 hours     Problem: Fetal Well-being Altered, Risk of - Intrapartum Patient - OB  Goal: Fetal heart rate within specified parameters  Outcome: Progress within 12 hours     Problem: Pain Acute / Chronic - Intrapartum Patient - OB  Goal: Able to cope with pain  Outcome: Progress within 12 hours  Goal: Control of acute pain  Outcome: Progress within 12 hours  Goal: Control of chronic pain  Outcome: Progress within 12 hours

## 2023-05-13 NOTE — Anesthesia Pre-Procedure Evaluation (Addendum)
[facility]  Anesthesia Preprocedure Evaluation    Procedure Information    Date: 05/12/23  Procedure: LABOR & DELIVERY ENCOUNTER         Precautions            None            Relevant Problems   No relevant active problems         Anesthesia Encounter History        CC/HPI/Past Medical History Summary: 37 y.o. 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 Anesthesia is consulted today to address Anxiety around pain during labor/delivery, BMI.     PMH:  -Chronic hypertension: BPs well-controlled on current dose of nifedipine 30 mg daily. No signs/symptoms of pre-eclampsia.              -Continue nifedipine              -Not taking ASA      -Anxiety: Improved, has a plan to restart SSRI postpartum.     -HSV: Discussed suppression at 36 weeks. No outbreaks.     Denies cardiac disease, asthma, liver disease, renal disease, seizures, or clotting disorders.     Plan: No contraindications for neuraxial anesthesia. Patient counseled on pain management during labor and r/b/a to epidural. Also, we offered reassurance about high BMI not being a contraindication to epidural placement. Patient is interested in epidural placement and will coordinate with her once she starts labor for placement.         (Please refer to [facility] Allergies, Problems, Past Medical History, Past Surgical History, Social History, and Family History activities, Results for current data from these respective sections of the chart; these sections of the chart are also summarized in reports, including the Patient Summary Extracts found in Chart Review)              Review of Systems   Constitutional: Negative.    Respiratory: Negative.     Cardiovascular: Negative.        Physical Exam    Airway:    Modified Mallampati score: IV. Thyromental distance: > 6.5 cm. Mouth opening: good.  Neck range of motion: full.     Constitutional:    Constitutional  system normal  HENT:      Mouth/Throat:      Dentition: Normal.   Cardiovascular:   Cardiovascular exam normal.  Pulmonary:   Pulmonary exam normal  Neurological:   Neurological system normal.    Dental: dentition is normal.                    Prepare (Pre-Operative Clinic) Assessment/Plan/Narrative      Obstructive Sleep Apnea Screening  STOPBANG Score:      S Do you Snore loudly (louder than talking or loud  enough to be heard through closed doors)? No    T Do you often feel Tired, fatigued, or sleepy during daytime? No    O Has anyone Observed you stop breathing during  your sleep? No    P Do you have or are you being treated for high blood Pressure? Yes    B BMI more than 35 KG/m^2? Yes    A Age over 25 years old? No    N Neck circumference > 16 inches (40cm)? No    G Gender: Female? No    STOPBANG Total Score 2  Risk Level (based only on STOPBANG) Low       CPAP/BiPAP prescribed - No.                Anesthesia Assessment and Plan  Day Of Surgery Provider Chart Review:  NPO status verified  medications reviewed  allergies reviewed  problem list reviewed  anesthesia history reviewed  pertinent labs reviewed  consults reviewed    ASA 2       Anesthesia Plan  Anesthesia Type: spinal and epidural  Invasive Monitors/Vascular Access: none    Anesthesia Potential Complication Discussion  There is the possibility of rare but serious complications.    Informed Consent for Anesthesia  Consent obtained from patient and healthcare power of attorney    Risks, benefits and alternatives including those of invasive monitoring discussed. Increased risks (as above) discussed.  Questions invited and all answered.  Interpreter: N/A - patient/guardian's preferred language is English      Spinal Anesthesia Consent:  As part of obtaining consent for spinal anesthesia, the following was discussed in detail with the patient (or their representative, as documented below):     1.  The role of the spinal anesthetic within the overall  anesthetic plan and alternative analgesic approaches  2.  How the spinal anesthetic will be performed  3.  Potential risks including, but not limited to:      a. lack of efficacy          i. possible need for additional anesthetics, such as general anesthesia or sedation          ii. possible need for alternative analgesics      b.leakage of spinal fluid causing headache      c. bleeding      d. infection      e. nerve damage which could result in permanent loss of sensation, loss of strength, and other neurologic deficits.   4.  Potential benefits, including, but not limited to:      a. improved pain control  5.  Potential alternatives, including, but not limited to:      a. reliance on systemic analgesics    Following this discussion, including the opportunity to ask questions, the patient (or their representative, as documented below) opted to proceed with spinal anesthesia.      Epidural Anesthesia Consent:  As part of obtaining consent for epidural anesthesia, the following was discussed in detail with the patient (or their representative, as documented below):     1.  The role of the epidural anesthetic within the overall anesthetic plan and alternative analgesic approaches  2.  How the epidural anesthetic will be performed  3.  Potential risks including, but not limited to:      a. lack of efficacy          i. possible need for additional anesthetics, such general anesthesia or sedation if epidural used for procedural anesthesia         ii. possible need for alternative analgesics      b. leakage of spinal fluid causing headache      c. bleeding      d. infection      e. nerve damage which could result in permanent loss of sensation, loss of strength, and other neurologic deficits.   4. Potential benefits, including, but not limited to:      a.improved pain control  5. Potential alternatives, including, but not limited to:      a. reliance on systemic analgesics  Following this discussion, including the  opportunity to ask questions, the patient (or their representative, as documented below) opted to proceed with the placement of the epidural catheter for anesthesia and/or pain management.        Consent granted for anesthetic plan    Quality Measure Documentation   Opioid Therapy Planned? Yes    (See Anesthesia Record for attending attestation)    [Please note, smart link data included in this note may not reflect changes since note creation. Please see appropriate section of [facility] for up-to-the minute information.]

## 2023-05-13 NOTE — Progress Notes (Signed)
OBSTETRICS LABOR PROGRESS NOTE     Subjective  Patient doing well. Comfortable in bed wanting to take a shower    Vitals  Temp:  [36.7 C] 36.7 C  Heart Rate:  [62-76] 67  *Resp:  [14-18] 18  BP: (126-143)/(63-83) 126/67  SpO2:  [98 %-100 %] 98 %    Physical Examination    Cervix:  *Dilation: 3  *Effacement (cm): 2  *Station: -2    Membrane Status:   intact    Fetal Heart Rate Tracing:  Baseline rate: 115 bpm  Variability: Moderate  FHR Changes: Accelerations    Tocometer:   Uterine Activity  $ Mode: Toco;Removed  Contraction Frequency: 2-3  Contraction Duration: 50-70  Contraction Quality: Mild  Resting Tone Palpated: Soft    Data  Labs Reviewed and Significant for:  CBC        05/12/23  2150   WBC 11.0*   HGB 10.7*   HCT 31.9*   PLT 302        Assessment and Plan  Kayla Frey is a 37 y.o. G1P0000 at [redacted]w[redacted]d here for IOL for cHTN.    FB out during AM rounds. Patient feeling comfortable. Discussed next steps would be pitocin vs AROM. Starting her on pitocin. AROM at next check. Ampicillin started.     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 On Valtrex since 36w. No prodrome, no lesions on admission SSE.    cHTN  Assessment & Plan  Since 2022, BP of 130-150/70-90. MRBPs at initiation of PN care 09/2022, then 12/2022 elevation to 160/68 in clinic, started on nifed 30mg  XL daily. At goal BP since. Pre-E labs normal.     Hx lsc sleeve gastrectomy  Assessment & Plan  In 2021 w/ subsequent 50lb weight loss    BMI 57  Assessment & Plan  [ ]  awareness    * IOL cHTN  Assessment & Plan  Labor:  05/12/23   2100 admitted   2315 c/l/h, 60cc FB placed  05/13/23   0342 miso x3    0920 3/2/-2 s/p miso x 5. FB out. Pitocin tarted    Fetal Well Being:   Cat I       Pain Control:  Open, eventually likely desires epidural    Mode of Delivery:  Expect NSVD        Daneen Schick, MD  (PGY-1)  [facility]   [phone]

## 2023-05-13 NOTE — Anesthesia Procedure Notes (Signed)
Neuraxial Block  Type:  Dural puncture epidural    Start Time:  05/13/2023 2:30 PM    General Information and Staff  Patient location during procedure: L&D room  Performed: anesthesia resident   Anesthesiologist: Iona Hansen, MD  Resident/Fellow/CRNA: Franchot Erichsen, MD  Service: Anesthesia  Block Follow-Up Team: MB L&D          Consent:     Consent given by:  Patient    Risks discussed:  Bleeding, infection, ischemia, nerve injury and pain    Consent Process:     Consent obtained: Written    Certified interpreter: Not indicated    Discussion included:  Diagnosis (the reason for which the procedure is being proposed) and proposed treatment or procedure, Risks, benefits, side effects, likelihood of success of the proposed treatment, anticipated recuperation, and the alternative options and Patient's questions related to the procedure were addressed    Written informed consent form completed by consenting provider and signed by patient/surrogate/witness after all questions answered    Universal Protocol - Time Out Checklist:     Patient ID verified:  [id number]    Site verified:  Yes      Purpose of procedure: L&D analgesia          Anesthesia Type: local    Preparation  Injection site: L3-4  Skin prep used: chlorhexidine gluconate     Technique  loss of resistance - saline  Number of Redirections:2.   Patient position: sitting      Needle  Epidural Tray Brand: Arrow    Type Tuohy, 17 G, length 3.5 in (9 cm),  depth at insertion was 8.5 cm.  Spinal Needle: Type Sprotte tip,  24 G,  length 4.7 in (12 cm),   Catheter  19 G,  and depth at skin was 13.5 cm.  Catheter Type: wire coil reinforced  Retention method: Mastisol and Tegaderm Number of Attempts:1,   Events    No  Events: patient tolerated procedure well and test dose negative     Procedure Notes: End Time: 05/13/2023 2:38 PM  For full procedure information, see associated anesthesia event/encounter.

## 2023-05-14 MED ORDER — MAGNESIUM HYDROXIDE 400 MG/5 ML ORAL SUSPENSION
400 | Freq: Every day | ORAL | Status: DC | PRN
Start: 2023-05-14 — End: 2023-05-16

## 2023-05-14 MED ORDER — FENTANYL (PF) 50 MCG/ML INJECTION SOLUTION
50 | INTRAMUSCULAR | Status: AC
Start: 2023-05-14 — End: 2023-05-14

## 2023-05-14 MED ORDER — LOPERAMIDE 2 MG CAPSULE
2 | Freq: Four times a day (QID) | ORAL | Status: DC | PRN
Start: 2023-05-14 — End: 2023-05-16

## 2023-05-14 MED ORDER — METHYLERGONOVINE 0.2 MG/ML (1 ML) INJECTION SOLUTION
0.2 | Freq: Once | INTRAMUSCULAR | Status: DC | PRN
Start: 2023-05-14 — End: 2023-05-16

## 2023-05-14 MED ORDER — NIFEDIPINE ER 30 MG TABLET,EXTENDED RELEASE 24 HR
30 | Freq: Two times a day (BID) | ORAL | Status: DC
Start: 2023-05-14 — End: 2023-05-14
  Administered 2023-05-14 – 2023-05-15 (×2): 30 mg via ORAL

## 2023-05-14 MED ORDER — OXYCODONE 5 MG TABLET
5 | Freq: Four times a day (QID) | ORAL | Status: DC | PRN
Start: 2023-05-14 — End: 2023-05-16
  Administered 2023-05-15 – 2023-05-16 (×4): 5 mg via ORAL

## 2023-05-14 MED ORDER — DEXTROSE 5 % IN LACTATED RINGERS IV BOLUS
5 | Freq: Once | INTRAVENOUS | Status: DC
Start: 2023-05-14 — End: 2023-05-14

## 2023-05-14 MED ORDER — LOPERAMIDE 2 MG CAPSULE
2 | Freq: Once | ORAL | Status: DC | PRN
Start: 2023-05-14 — End: 2023-05-16

## 2023-05-14 MED ORDER — CALCIUM 200 MG (AS CALCIUM CARBONATE 500 MG) CHEWABLE TABLET
500 | Freq: Four times a day (QID) | ORAL | Status: DC | PRN
Start: 2023-05-14 — End: 2023-05-16

## 2023-05-14 MED ORDER — SODIUM CHLORIDE 0.9 % (FLUSH) INJECTION SYRINGE
0.9 | INTRAMUSCULAR | Status: DC | PRN
Start: 2023-05-14 — End: 2023-05-16

## 2023-05-14 MED ORDER — SODIUM CHLORIDE 0.9 % (FLUSH) INJECTION SYRINGE
0.9 | Freq: Three times a day (TID) | INTRAMUSCULAR | Status: DC
Start: 2023-05-14 — End: 2023-05-16
  Administered 2023-05-15 – 2023-05-16 (×4): 3 mL via INTRAVENOUS

## 2023-05-14 MED ORDER — MISOPROSTOL 200 MCG TABLET
200 | Freq: Once | ORAL | Status: DC | PRN
Start: 2023-05-14 — End: 2023-05-16

## 2023-05-14 MED ORDER — NIFEDIPINE ER 60 MG TABLET,EXTENDED RELEASE 24 HR
60 | Freq: Two times a day (BID) | ORAL | Status: DC
Start: 2023-05-14 — End: 2023-05-14

## 2023-05-14 MED ORDER — LACTATED RINGERS IV BOLUS
Freq: Once | INTRAVENOUS | Status: AC
Start: 2023-05-14 — End: 2023-05-14
  Administered 2023-05-14: 17:00:00 1000 mL via INTRAVENOUS

## 2023-05-14 MED ORDER — BISACODYL 10 MG RECTAL SUPPOSITORY
10 | Freq: Every day | RECTAL | Status: DC | PRN
Start: 2023-05-14 — End: 2023-05-16

## 2023-05-14 MED ORDER — FENTANYL (PF) 50 MCG/ML INJECTION SOLUTION
50 | Freq: Once | INTRAMUSCULAR | Status: DC | PRN
Start: 2023-05-14 — End: 2023-05-14
  Administered 2023-05-14: 22:00:00 100 via INTRAVENOUS

## 2023-05-14 MED ORDER — NIFEDIPINE ER 30 MG TABLET,EXTENDED RELEASE 24 HR
30 | Freq: Once | ORAL | Status: AC
Start: 2023-05-14 — End: 2023-05-14
  Administered 2023-05-15: 03:00:00 30 mg via ORAL

## 2023-05-14 MED ORDER — CARBOPROST TROMETHAMINE 250 MCG/ML INTRAMUSCULAR SOLUTION
250 | INTRAMUSCULAR | Status: DC | PRN
Start: 2023-05-14 — End: 2023-05-16

## 2023-05-14 MED ORDER — DOCUSATE SODIUM 250 MG CAPSULE
250 | Freq: Two times a day (BID) | ORAL | Status: DC
Start: 2023-05-14 — End: 2023-05-16
  Administered 2023-05-15 – 2023-05-16 (×4): 250 mg via ORAL

## 2023-05-14 MED ORDER — OXYTOCIN 10 UNIT/ML INJECTION SOLUTION
10 | Freq: Once | INTRAMUSCULAR | Status: DC | PRN
Start: 2023-05-14 — End: 2023-05-16

## 2023-05-14 MED ORDER — ONDANSETRON HCL (PF) 4 MG/2 ML INJECTION SOLUTION
4 | Freq: Four times a day (QID) | INTRAMUSCULAR | Status: DC | PRN
Start: 2023-05-14 — End: 2023-05-16

## 2023-05-14 MED ORDER — SODIUM CHLORIDE 0.9 % INTRAVENOUS PIGGYBACK
0.9 | Freq: Two times a day (BID) | INTRAVENOUS | Status: DC | PRN
Start: 2023-05-14 — End: 2023-05-16

## 2023-05-14 MED ORDER — LIDOCAINE-EPINEPHRINE (PF) 1.5 %-1:200,000 INJECTION SOLUTION
1.5 | INTRAMUSCULAR | Status: AC
Start: 2023-05-14 — End: 2023-05-15

## 2023-05-14 MED ORDER — IBUPROFEN 600 MG TABLET
600 | Freq: Four times a day (QID) | ORAL | Status: DC
Start: 2023-05-14 — End: 2023-05-14

## 2023-05-14 MED ORDER — SENNOSIDES 8.6 MG TABLET
8.6 | Freq: Every evening | ORAL | Status: DC | PRN
Start: 2023-05-14 — End: 2023-05-16

## 2023-05-14 MED ORDER — POLYETHYLENE GLYCOL 3350 17 GRAM ORAL POWDER PACKET
17 | Freq: Every day | ORAL | Status: DC | PRN
Start: 2023-05-14 — End: 2023-05-16

## 2023-05-14 MED ORDER — OXYTOCIN 30 UNIT/500 ML IN 0.9 % SODIUM CHLORIDE INTRAVENOUS
30 | INTRAVENOUS | Status: DC | PRN
Start: 2023-05-14 — End: 2023-05-16

## 2023-05-14 MED ORDER — NIFEDIPINE ER 60 MG TABLET,EXTENDED RELEASE 24 HR
60 | Freq: Two times a day (BID) | ORAL | Status: DC
Start: 2023-05-14 — End: 2023-05-16
  Administered 2023-05-15 – 2023-05-16 (×2): 60 mg via ORAL

## 2023-05-14 MED ORDER — PRENATAL VIT,CALCIUM 27-FERROUS FUM 60 MG IRON-FOLIC ACID 1 MG TABLET
60 | Freq: Every day | ORAL | Status: DC
Start: 2023-05-14 — End: 2023-05-16
  Administered 2023-05-15 – 2023-05-16 (×3): 1 via ORAL

## 2023-05-14 MED ORDER — LIDOCAINE-ALOE VERA 0.5 % TOPICAL SPRAY
0.5 | Freq: Four times a day (QID) | TOPICAL | Status: DC | PRN
Start: 2023-05-14 — End: 2023-05-16

## 2023-05-14 MED ORDER — ACETAMINOPHEN 500 MG TABLET
500 | Freq: Four times a day (QID) | ORAL | Status: DC | PRN
Start: 2023-05-14 — End: 2023-05-16
  Administered 2023-05-15 – 2023-05-16 (×5): 1000 mg via ORAL

## 2023-05-14 MED FILL — AMPICILLIN 1 GRAM SOLUTION FOR INJECTION: 1 gram | INTRAMUSCULAR | Qty: 10

## 2023-05-14 MED FILL — TERBUTALINE 1 MG/ML SUBCUTANEOUS SOLUTION: 1 1 mg/mL | SUBCUTANEOUS | Qty: 1

## 2023-05-14 MED FILL — FENTANYL-ROPIVACAINE-NACL (PF) 2 MCG/ML-0.1% EPIDURAL SOLUTION: 2 2 mcg/mL-0.1 % | EPIDURAL | Qty: 200

## 2023-05-14 MED FILL — NIFEDIPINE ER 30 MG TABLET,EXTENDED RELEASE 24 HR: 30 mg | ORAL | Qty: 1

## 2023-05-14 MED FILL — FENTANYL (PF) 50 MCG/ML INJECTION SOLUTION: 50 50 mcg/mL | INTRAMUSCULAR | Qty: 2

## 2023-05-14 MED FILL — AMPICILLIN 1 GRAM SOLUTION FOR INJECTION: 1 1 gram | INTRAMUSCULAR | Qty: 10

## 2023-05-14 MED FILL — FENTANYL-ROPIVACAINE-NACL (PF) 2 MCG/ML-0.1% EPIDURAL SOLUTION: 2 mcg/mL-0.1 % | EPIDURAL | Qty: 200

## 2023-05-14 MED FILL — ONDANSETRON HCL (PF) 4 MG/2 ML INJECTION SOLUTION: 4 4 mg/2 mL | INTRAMUSCULAR | Qty: 2

## 2023-05-14 MED FILL — XYLOCAINE-MPF/EPINEPHRINE 1.5 %-1:200,000 INJECTION SOLUTION: 1.5 1.5 %-1:200,000 | INTRAMUSCULAR | Qty: 10

## 2023-05-14 NOTE — Progress Notes (Signed)
OBSTETRICS LABOR PROGRESS NOTE     Subjective  Sleeping comfortably.    Vitals  Temp:  [36.5 C-36.9 C] 36.7 C  Heart Rate:  [59-109] 69  *Resp:  [16-20] 16  BP: (108-150)/(53-78) 127/59  SpO2:  [95 %-100 %] 97 %    Physical Examination    Cervix:  *Dilation: 5  *Effacement (cm): 1  *Station: -1    Membrane Status:  Rupture Date: 05/13/23  Rupture Time: 1605    Fetal Heart Rate Tracing:  Baseline rate: 135 bpm  Variability: Moderate  FHR Changes: Variable decelerations    Tocometer:   Uterine Activity  $ Mode: IUPC;Palpation  Contraction Frequency: 1-3  Contraction Duration: 40-90  Contraction Quality: Moderate  Resting Tone Palpated: Soft  MVU's (mmHg): 33-66  Resting tone (mmHg): 3    Data  Labs Reviewed and Significant for:  CBC        05/12/23  2150   WBC 11.0*   HGB 10.7*   HCT 31.9*   PLT 302        Assessment and Plan  Kayla Frey is a 37 y.o. G1P0000 at [redacted]w[redacted]d here for IOL for cHTN in early labor. Recently progressed to 5/1/-1. Continues having intermittent variable decels with possible late decels as well. Will begin an amnioinfusion and continue position changes. Check back in around 4 hours or sooner PRN.       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 On Valtrex since 36w. No prodrome, no lesions on admission SSE.    cHTN  Assessment & Plan  Since 2022, BP of 130-150/70-90. MRBPs at initiation of PN care 09/2022, then 12/2022 elevation to 160/68 in clinic, started on nifed 30mg  XL daily. At goal BP since. Pre-E labs normal.     Hx lsc sleeve gastrectomy  Assessment & Plan  In 2021 w/ subsequent 50lb weight loss    BMI 57  Assessment & Plan  [ ]  awareness    * IOL cHTN  Assessment & Plan  Labor:  05/12/23   2100 admitted   2315 c/l/h, 60cc FB placed  05/13/23   0342 miso x3    0920 3/2/-2 s/p miso x 5. FB out. Pitocin tarted   1640 4/1/-1, AROM to clears, FSE,  IUPC placed  05/14/23   0154 4/1/-2   0620 5/1/-1    Fetal Well Being:   Cat II intermittent variables baseline 130s moderate variability       Pain Control:  Epidural    Mode of Delivery:  Expect NSVD        Daneen Schick, MD (PGY-1)  [facility]   [phone]

## 2023-05-14 NOTE — Plan of Care (Signed)
Problem: Labor Support - Intrapartum Patient - OB  Goal: Effective coping  Outcome: Resolved     Problem: Infection, Risk of - Intrapartum  Goal: Absence of infection signs and symptoms  Outcome: Resolved     Problem: Fetal Well-being Altered, Risk of - Intrapartum Patient - OB  Goal: Fetal heart rate within specified parameters  Outcome: Resolved     Problem: Pain Acute / Chronic - Intrapartum Patient - OB  Goal: Able to cope with pain  Outcome: Resolved  Goal: Control of acute pain  Outcome: Resolved  Goal: Control of chronic pain  Outcome: Resolved     Problem: Discharge Planning - OB  Goal: Knowledge of and participation in plan of care  Outcome: Progress within 12 hours     Problem: Adjustment to Parenting - Postpartum Patient - OB  Goal: Able to perform infant care tasks  Outcome: Progress within 12 hours  Goal: Parent-infant bonding initiation  Outcome: Progress within 12 hours     Problem: Breast Milk Expression / Pumping - Postpartum Patient - OB  Goal: Effective pumping of breastmilk  Outcome: Progress within 12 hours  Goal: Effective hand expression of breastmilk  Outcome: Progress within 12 hours

## 2023-05-14 NOTE — Anesthesia Procedure Notes (Signed)
Neuraxial Block  Type:  Dural puncture epidural    Start Time:  05/14/2023 3:50 PM    General Information and Staff  Patient location during procedure: L&D room  Performed: anesthesia resident   Anesthesiologist: Cynda Acres, MD  Resident/Fellow/CRNA: Nicoletta Ba, MD  Service: Anesthesia  Block Follow-Up Team: [facility] L&D          Consent:     Consent given by:  Patient    Risks discussed:  Bleeding, infection, nerve injury and pain    Consent Process:     Consent obtained: Written    Certified interpreter: Not indicated    Discussion included:  Diagnosis (the reason for which the procedure is being proposed) and proposed treatment or procedure, Risks, benefits, side effects, likelihood of success of the proposed treatment, anticipated recuperation, and the alternative options and Patient's questions related to the procedure were addressed    Written informed consent form completed by consenting provider and signed by patient/surrogate/witness after all questions answered    Universal Protocol - Time Out Checklist:     Patient ID verified:  Yes    Site verified:  Yes      Purpose of procedure: L&D analgesia          Anesthesia Type: local    Preparation  Injection site: L3-4  Skin prep used: chlorhexidine gluconate     Technique  loss of resistance - saline  Number of Redirections:0.   Patient position: sitting      Needle  Epidural Tray Brand: Arrow    Type Tuohy, 17 G, length 3.5 in (9 cm),  depth at insertion was 9 cm.  Spinal Needle: Type Sprotte tip,  24 G,  length 4.7 in (12 cm),   Catheter  19 G,  and depth at skin was 14 cm.  Catheter Type: wire coil reinforced  Retention method: Mastisol and Tegaderm Number of Attempts:1,   Events    No  Events: patient tolerated procedure well and test dose negative     Procedure Notes: Procedure Notes: Replaced prior epidural due to poor analgesic coverage    End Time: 05/14/2023 3:59 PM  For full procedure information, see associated anesthesia  event/encounter.

## 2023-05-14 NOTE — Nursing Note (Signed)
Patient and family called RN to discuss pain control with epidural and labor progress. CNM Kathleen Argue MD Illangasekare MD Roversi informed. PLan to assess patient after pain more well controlled.    Anesthesia team en route to assess patient.

## 2023-05-14 NOTE — Progress Notes (Signed)
OBSTETRICS LABOR PROGRESS NOTE     Subjective  Patient was expressing more pain in the lower abdominal region. Patient requesting a check sooner given increased pain.     Vitals  Temp:  [36.5 C-37.3 C] 36.8 C  Heart Rate:  [59-96] 89  *Resp:  [16-20] 18  BP: (108-158)/(53-98) 134/65  SpO2:  [94 %-100 %] 100 %    Physical Examination    Cervix:  *Dilation: 9  *Effacement (cm): <0.5  *Station: -1    Membrane Status:  Rupture Date: 05/13/23  Rupture Time: 1605    Fetal Heart Rate Tracing:  Baseline rate: 150 bpm  Variability: Moderate  FHR Changes: Variable decelerations    Tocometer:   Uterine Activity  $ Mode: IUPC  Contraction Frequency: 1.5-2.5  Contraction Duration: 58-109  Contraction Quality: Mild  Resting Tone Palpated: Soft  MVU's (mmHg): 41  Resting tone (mmHg): 43-48  Pushing: No    Data  Labs Reviewed and Significant for:  CBC  No results found in last 36 hours       Assessment and Plan  Kayla Frey is a 37 y.o. G1P0000 at [redacted]w[redacted]d here for IOL for cHTN. Now in active labor. 9/<0.5/-1. Checked patient again per patient request given significantly increased pain. Will reach out to anesthesia for possible replacement of catheter. 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 On Valtrex since 36w. No prodrome, no lesions on admission SSE.    cHTN  Assessment & Plan  Since 2022, BP of 130-150/70-90. MRBPs at initiation of PN care 09/2022, then 12/2022 elevation to 160/68 in clinic, started on nifed 30mg  XL daily. At goal BP since. Pre-E labs normal.     Hx lsc sleeve gastrectomy  Assessment & Plan  In 2021 w/ subsequent 50lb weight loss    BMI 57  Assessment & Plan  [ ]  awareness    * IOL cHTN  Assessment & Plan  Labor:  05/12/23   2100 admitted   2315 c/l/h, 60cc FB placed  05/13/23   0342 miso x3    0920 3/2/-2 s/p miso x 5. FB out.  Pitocin tarted   1640 4/1/-1, AROM to clears, FSE, IUPC placed  05/14/23   0154 4/1/-2   0981 5/1/-1   0910 5/1/-2, 3 min decel (resolved w position changes and pit off)   1400 8/<0.5/-1   1500 9/<0.5/-1    Fetal Well Being:   Cat II intermittent variables baseline 130s moderate variability       Pain Control:  Epidural    Mode of Delivery:  Expect NSVD        Daneen Schick, MD (PGY-1)  [facility]   [phone]

## 2023-05-14 NOTE — Progress Notes (Signed)
Progress Note    Briefly, went to bedside to recommend increasing nifed to 60mg  BID given intermittent MR BP on nifed 30mg  BID. Patient amenable to plan and denies pre-e symptoms.     Vallarie Mare, MD/MPP  Resident Physician PGY-4  [facility]  05/14/23

## 2023-05-14 NOTE — Discharge Instructions (Signed)
Req 1 week bp check for gHTN, has 2 week and 6 week PP visit scheduled    Discharge Instructions Vaginal Birth    Postpartum Care and Recovery: The First Six Weeks - [place]  Before you leave the hospital your provider will tell you when to schedule a postpartum visit in the office. Usually this will be in about six weeks. You should, however, call your provider anytime you have any concerns. Some concerns to call for include severe bleeding, anywhere in your body, severe pain, fever, blurred vision or visual changes, depression, or anxiety.     When to call your provider:  Increase in bleeding: Soaking >1 maxi pad/hour; passing >walnut size clots  Foul smelling discharge  Fever >100.4 F (38 C)  New or worsening pain, that is not helped by pain medicine  Problems with urinating or emptying your bladder  Severe unusual headache, blurred or double vision.  Pain anywhere in your body that is worsening or severe.  Suspected depression or anxiety.   Please call the office of your prenatal care.  [facility] Phone Number: [phone]    If your clinic is closed and you are local to [facility], please call  The triage to have questions answered or to be seen at: [phone] ex. 2.  Vaginal Bleeding    It is normal to have some bleeding or discharge for six weeks or more after giving birth. At first, the bleeding is bright red and heavier than a period. Within two weeks, the flow should lighten so you need only a mini pad. The color may be pink, brown, or red. The bleeding may stop for a few days and then start again. This is normal and may depend on how active you were during the day.   Stitches and Hemorrhoids    After giving birth, women may have some tears, swelling around the vagina and/or hemorrhoids. Ice packs will help with swelling and discomfort during the first day. By the second day, switch from ice to sitz baths, soaking in warm water in a cleaned bathtub or in a plastic basin that fits in the  toilet.(sitz bath). Continuing these soaks once or twice a day for 5-10 days will lower pain, may prevent infection and may speed up the healing process by bringing more blood to the area. Your nurse will show you how to use a sitz bath.  If your tears required stitches, the stitches will dissolve and do not need to be removed.   Hemorrhoids are generally aggravated by pregnancy and also during pushing in labor. They will start to improve after birth. Warm soaks (sitz baths) which end with either soaking in cold water or a tiny ice pack just for the hemorrhoid can help shrink hemorrhoids.  Avoid constipation by drinking plenty of water and eating  fiber (such as bran cereal, ground flaxseeds, and fruits and vegetables) every day. Taking a stool softener, which is available over the counter, also may help.  Pain/Discomfort    It is normal to have pain and discomfort after giving birth. Most often women have cramping that may be similar or worse than menstrual cramps. The cramps may occur before or during breastfeeding as your body releases hormones.    There also may be pain/soreness in your vagina and perineum (area between the vagina and the bottom). This will get better over time and using comfort measure such as ice day one, warm water after day one, and rest will help. You can  take Ibuprofen (Motrin, Advil), Acetaminophen (Tylenol) or other pain medications as prescribed by your provider.   Bladder Function    Due to muscle stretching, temporary nerve damage and hormonal changes that occur during pregnancy and childbirth, some women may have trouble holding their urine. This means having trouble holding urine long enough to make it to the bathroom when the urge strikes (urge incontinence) or losing urine when they cough, sneeze or run (stress incontinence).  Time will take care of much of this and you should notice slow steady improvement as the weeks go by. Training your bladder by going to the bathroom every  2-3 hours may help you better control your urine.  Performing Kegel exercises can also help with incontinence problems. It is recommended to do kegals exercises sitting, standing, and walking (do kegals in different positions) up to 60x/day. Lowella Dell can be done throughout the day.   Diet    Continue to eat as you have during pregnancy. You need some calcium in your diet, and do not need to take extra calcium.  You can continue taking your prenatal vitamins or multivitamin. Drinking fluids to thirst  (usually about 8 glasses per day) are important to replace the blood lost during childbirth, to make breast milk, and avoid constipation.  Sleep    Sleep deprivation is a challenge for all new parents. While there is no way to get the uninterrupted sleep you probably want, make sure that you get as much as you can. Pain, frustration and depression are all aggravated by lack of sleep. Whenever possible have another adult at home with you during the first few weeks to help take care of your baby and help you get some extra rest. While many people will ask if you are getting your baby to sleep through the night, remember that it is normal for babies to awaken frequently, usually to eat, for up to one year. They usually drop one of their night feeds at 6 weeks. You should know that night feeds are normal and that it is healthy for babies to awaken frequently. Breastfeeding and awakening frequently (as often as every 2 hours) helps prevent Sudden Infant Death Syndrome.    Sexual Activity    Bleeding, healing stitches, fatigue, hormonal changes and the demands of breastfeeding all affect a womans comfort and interest in sexual activity. Only you can decide when you are ready. Emotional support and affection are always important. We recommend that you wait in beginning sexual activity until after seeing your provider.  The vagina may be dryer after giving birth, particularly for breastfeeding women. Using a lubricant may make  intercourse more comfortable. Lubricants are sold over the counter in the drugstore, usually in the condom section. Do not use oils or Vaseline as they do not wash away well and can damage condoms.  Although exclusive breastfeeding can reduce the chances of getting pregnant in the first 6 months, if you dont supplement and nurse round the clock, it does not completely protect against pregnancy. Using contraception for intercourse is recommended if you do not want to get pregnant again right away. Health wise, waiting a year to become pregnant allows your body to recover, including replacing your iron stores, and losing the pregnancy weight.   Condoms are always an option for protection against pregnancy. Read contraception information and talk with your provider about other options to prevent pregnancy.  Activity and Exercise    After nine months of being pregnant, many women are eager to  get their bodies back. However, just as it took those nine months to give birth, it takes time to feel and look like you again. There are certain changes that need to occur and cannot be sped along. It takes time for the uterus to shrink, for your body to lose the extra fluid and hormone levels to return to normal.  The activities of daily living that you do to care for yourself and your baby around home are plenty of activity in the first few weeks. Any spare time should be spent sleeping and off your feet. This will allow stitches to heal, bleeding to slow down and your body to recover.  Kegel exercises can be started anytime.  After your postpartum checkup, you may begin gradually to add other exercise.    [facility]partum [facility] (PPV) Support: Postpartum Online Support Group for [facility] Patients    -Listen and share your story as a parent of a newborn, from the comfort of your own home.  -This postpartum online support group is offered on a drop-in basis. Our goal is to support our postpartum patients who are facing the ups and  downs of being a parent, healing and connecting with other families especially during the uncertainty of the COVID-19 pandemic.  -Facilitated peer support Strategies for postpartum recovery  -Practice of stress reduction techniques, such as mindfulness.  This support group is not intended to treat direct postpartum medical conditions, but a referral can be provided if needed.  Facilitated by Lovenia Shuck, CNM, and/or Andrey Farmer MD  Via zoom on Weds 1:30-2:45  To register, call [phone]       HIGH BLOOD PRESSURE IN PREGNANCY AND FOLLOW-UP    You received a diagnosis of high blood pressure in pregnancy. This can include a range of disorders:  Chronic hypertension:  high blood pressure that was present before you got pregnant  Gestational hypertension:  high blood pressure that started in the second half of pregnancy, typically late in the 3rd trimester  Pre-eclampsia: High blood pressure plus protein in your urine. If pre-eclampsia is severe, women can have symptoms like headache or vision changes, abnormal kidney, lung, or liver function and other complications. When a woman with chronic hypertension is affected, it is called superimposed pre-eclampsia. When a woman with high blood pressures in pregnancy has a seizure, it is called eclampsia.    While high blood pressure due to pregnancy usually resolves once the pregnancy ends, it might not go away for the first 6 weeks after delivery. We want to follow you closely after delivery, as it is possible for the elevated blood pressure could return or even worsen after delivery.     FOLLOWING YOUR BLOOD PRESSURE AFTER DELIVERY  We want to follow your blood pressures post delivery in case they worsen. Return to [facility] clinic for blood pressure check 7-10 days after your delivery. Please call the clinic where you had your prenatal care for a blood pressure check if you are not contacted by the clinic for this appointment.      While checking your blood pressure at  home, please call [phone] if your blood pressures are 150 or higher on the top or 100 or lower on the bottom. Seek immediate emergency attention if your blood pressure is above 160/110.    DANGER SIGNS  Call your clinic, or [phone] if the clinic is closed, if you have any of the following:  Headache that doesn't resolve with pain medicine, eating, drinking fluids or rest.  Visual changes including blurry vision, tunnel vision, loss of vision, (especially one sided) or seeing floaters/squiggly lines.  Abdominal pain located in the upper part of your abdomen.     EMERGENCY SYMPTOMS  SEEK IMMEDIATE ATTENTION IF YOU HAVE ANY OF THE FOLLOWING:  Localized weakness of one part of your body  Trouble speaking or understanding  Difficulty breathing  Seizures  Loss of consciousness  YOUR SIX-WEEK CHECK-UP  Your 6-week check-up is important for your general health, to check on your recovery, and to be sure that your blood pressure is normal.  If you had pre-eclampsia and are interested in getting pregnant again, talk to your OB/GYN about ways to reduce the risk of getting pre-eclampsia with the next pregnancy.    LONG-TERM FOLLOW UP  Hypertensive disorders in pregnancy may increase your risk for heart disease in the future, so it is crucial to follow-up with your doctor regularly. Pre-eclampsia increases your risk for developing:   High blood pressure   Heart attacks  Stroke   Blood clots (that may travel to your lungs)  Kidney disease     Regular check-ups with your primary care doctor may decrease your risk. Together you can discuss prevention strategies such as lifestyle changes (eating a balanced healthy diet, exercising regularly, limiting alcohol, avoiding smoking) or medications if needed to reduce your risk of heart disease and promote a long and healthy life!     If you do not already have a PCP, it is ideal that you get one to follow your health. To schedule your first appointment, call:  [facility]  Primary Care: [phone]  [facility]: [phone]    What can you do to reduce your risk for high blood pressure?  Stay physically active. Exercise 150 minutes each week.  Maintain a healthy weight.  Eat a healthy diet high in vegetables and fruits and limit your salt.  Have your blood pressure checked regularly and manage high blood pressure.  Take blood pressure medication exactly as directed.           MONITORING YOUR BLOOD PRESSURE AT HOME    Please check and write down your blood pressure each day at around the same time.  Example:  110    70    Before you check your blood pressure, please make sure you:  Do not have any caffeine (coffee, tea, soda, energy drinks, etc.) or tobacco for 30 minutes before checking  Sit down and rest for at least 5 minutes before checking  Have your arm with the blood pressure cuff at the level of your heart, be sitting with your feet on the floor, your legs uncrossed, and your back supported    Date/Time            Blood Pressure              Date/Time            Blood Pressure              Date/Time            Blood Pressure              Date/Time            Blood Pressure                Reminder: If you have a HEADACHE that does not go away with acetaminophen (Tylenol), SPOTS OR CHANGES IN YOUR VISION, or  RIGHT UPPER BELLY PAIN, please call the OB Clinic at [phone] during the day or the [facility] triage at [phone] during holidays, nights and weekends.    Current BP   Medications:          What do I do with my blood pressure readings?    If your blood pressure reading is:  LESS than 140 (on top)  & LESS than 90 (on the bottom)  Your blood pressure is well controlled!      If your blood pressure reading is:   BETWEEN 140 & 159  Or BETWEEN 90 & 105 (on the bottom)  Your blood pressure needs some attention soon!     Please call the OB Clinic at [phone] during the day or the [facility] Triage at [phone] during  holidays, nights and weekends to let us know and get help!      If your blood pressure reading is:   GREATER than 160 (on top)  Or GREATER than 106 (on the bottom)                    You may have a dangerous situation with your blood pressure!    Please call the OB Clinic at [phone] during the day or the [facility] Triage at [phone] during holidays, nights and weekends. You will likely need to come in to the Birth Center to be seen.             Anxiety and depression are the most common complications of pregnancy.     Postpartum Support Physiological [email or web address]  [phone]    Please know there are resources out there to help you be well!      TEN FACTS ABOUT DEPRESSION and ANXIETY in pregnancy and postpartum    1. You are not alone. You are not to blame. With help, you will feel better.  Anyone can become depressed or anxious during pregnancy or after the baby comes. It is not your fault. It is caused by many stresses happening at the same time. Many women develop depression or anxiety because of changes in our hormones, our feelings, our relationships, and sometimes in stress about work,  housing, or money. No matter how sad or scared you feel, you can get through this with help.    2. You need regular breaks from taking care of your children and your house.  You need to get breaks to feel good about the hard work of being a mother. Taking a break will help you do a better job of being a mother, and it will help you feel better.    3. Perinatal depression and anxiety do not go away fast: there is no quick fix or cure. You WILL feel better if you keep taking steps to get help and to take care of yourself. It is hardest in the beginning, and it will get easier. Find a healthcare provider you like, people who can help you at home, and friends that listen. Dont give up.    4. You will feel better if you reach out to understanding people and say how you feel. Women who go through depression  without help are more unsure about themselves as mothers. Talking to other women who have recovered will help you a lot. It is normal to feel shy and embarrassed at first, but it will help you to talk to someone who understands.    5. You will feel worse if you  judge your life on a bad day. On a bad day, we see things negatively and feel worse by judging our lives and ourselves. Make a rule  that you will not judge yourself on a hard day. Instead, fill your day with things that help you: get active, go outside, express your feelings, have a good cry, or listen to music. Do not compare yourself to other women, and try not to compare your partner, your body, your home, or your children to others.    6. You will find what works for [employer]. It might be different from what works for other women. Remember that different people need different solutions. This is true about medication, nursing, where the baby sleeps, how you teach your children, and where you get support. Be open to changing your plans so that you can find the things that work best for you and your family.    7. You will feel better if you get outside as much as you can. Even a little bit helps.    8. Recovery from perinatal depression or anxiety has ups and downs. There are good days, bad days, and boring days. If you keep to a plan of self-care, breaks, support, and remedies, you will keep feeling better. It is normal to worry if you have a bad day after youve been feeling better. Dont give up. You will get through this. When you have a bad day, think about your last few days. Did you get any time to yourself? Did you do too much? Are you angry? Did the baby grow a lot or make a change in nursing? Have you been eating well?    9. Be true to yourself, and trust that you will find your way. Your feelings or thoughts do not hurt your baby. How you act does matter. It is normal to cry and feel mad, frustrated, scared, or to feel nothing inside. But try to focus  on what you are doing on the outside. Children feel good when you look in their eyes, let them know they are safe with you, hold them and smile when you can. Take breaks so that you will be more relaxed when you are with them.    10. Good mothers can get depressed. Depression can make women feel bad and afraid about motherhood. They get afraid that they will never be happy. But that is the depression, and when it gets better, you will feel better about being a mom. Be kind to yourself. See if you can accept ALL your feelings and remember that good mothers can feel sad, scared, or bored sometimes. Depression will not last forever. Spend time with people who make you feel  good about yourself and hopeful about the future. You will feel better, and you are not alone.      FOR MORE INFORMATION, PLEASE VISIT: www.[email or web address].net    (Handout by Letitia Libra, PhD, Postpartum [employer]. With permission)   Perinatal Therapists     [place]:     Lavada Mesi, PhD  [address]    [phone]     Eula Fried. Cilia, MA, LMFT  [email or web address]   [address]   [phone]   [email or web address]      Rolley Sims, MFT  [email or web address]  [address] (also [place] office at [employer])   [phone]  [email or web address] Mayhew, PsyD  [email or web address]  [email or web address]   [phone]  [email or web address]    Lauretta Grill, PsyD   [email or web address]  [address]  [phone]   [email or web address]    Radford Pax, PsyD  Office is one Optometrist at [place] in [place]  [phone]    Dola Factor, PsyD  [email or web address]  [phone]    Quillian Quince, LCSW  [email or web address]  [address], [place] [address]  [phone]    Donnelly Angelica, MFT  [email or web address]  [address] [place] [address]  [phone]  [email or web address]    Everlene Balls, Ph.D  [email or web address]  [address]  [phone]  [email or web address]     [place]:  Graciela Husbands, LMFT  [email or web address]    [facility]:    Arna Medici, PhD  [address] [place], [place]  [address]  and [address] [place], [place]  [address]  [phone]  [email or web address]    Glean Hess, PhD  [facility] Office:  [address]  [place], [place] [address]  Email to: [email or web address]  Phone: [phone]    Derald Macleod, MFT  [address], [place], [place] [address]     [phone]  [email or web address]

## 2023-05-14 NOTE — Progress Notes (Signed)
OBSTETRICS LABOR PROGRESS NOTE      Subjective  Patient epidural recently re dosed. Patient feeling more comfortable. Open to cervical check.      Vitals  Temp:  [36.5 C-37.3 C] 36.8 C  Heart Rate:  [59-96] 92  *Resp:  [16-20] 18  BP: (108-150)/(53-98) 135/98  SpO2:  [94 %-100 %] 99 %       Cervix:  *Dilation: 10  *Effacement (cm): <0.5  *Station: 0     Membrane Status:  Rupture Date: 05/13/23  Rupture Time: 1605     Fetal Heart Rate Tracing:  Baseline rate: 150 bpm  Variability: Moderate  FHR Changes: Variable decelerations;Accelerations; late decelerations      Tocometer:   Uterine Activity  $ Mode: IUPC  Contraction Frequency: 1-3  Contraction Duration: 42-94  Contraction Quality: Mild  Resting Tone Palpated: Soft  MVU's (mmHg): 72  Resting tone (mmHg): 40        Assessment and Plan  Kayla Frey is a 37 y.o. G1P0000 at [redacted]w[redacted]d here for IOL for cHTN in active labor now in second stage.  Cat II FHTs. Recurrent late decels and variables with pushing effort, improve when pushing on the maternal right side.  Fluid bolus given.  Great dissent with every push.  Skipping some UCs for fetal recovery.  Dr. Harriet Butte aware of pushing and FHTs.  Available and close by if needed.         * IOL cHTN  Assessment & Plan  Labor:  05/12/23         2100 admitted         2315 c/l/h, 60cc FB placed  05/13/23         0342 miso x3          0920 3/2/-2 s/p miso x 5. FB out. Pitocin tarted         1640 4/1/-1, AROM to clears, FSE, IUPC placed  05/14/23         0154 4/1/-2         2536 5/1/-1         0910 5/1/-2, 3 min decel (resolved w position changes and pit off)         1400 8/<0.5/-1  1600 9/0  1700 C/0-+1         Fetal Well Being:   Cat II intermittent variables baseline 130s moderate variability        Pain Control:  Epidural     Mode of Delivery:  Expect NSVD

## 2023-05-14 NOTE — Progress Notes (Signed)
OBSTETRICS LABOR PROGRESS NOTE     Subjective  Patient epidural recently re dosed. Patient feeling more comfortable. Family is at bedside and they collectively had expressed anxiousness about the labor course.     Vitals  Temp:  [36.5 C-37.3 C] 36.8 C  Heart Rate:  [59-96] 92  *Resp:  [16-20] 18  BP: (108-150)/(53-98) 135/98  SpO2:  [94 %-100 %] 99 %    Physical Examination    Cervix:  *Dilation: 8  *Effacement (cm): <0.5  *Station: -1    Membrane Status:  Rupture Date: 05/13/23  Rupture Time: 1605    Fetal Heart Rate Tracing:  Baseline rate: 150 bpm  Variability: Moderate  FHR Changes: Variable decelerations;Accelerations;Early decelerations    Tocometer:   Uterine Activity  $ Mode: IUPC  Contraction Frequency: 1-3  Contraction Duration: 42-94  Contraction Quality: Mild  Resting Tone Palpated: Soft  MVU's (mmHg): 72  Resting tone (mmHg): 40    Data  Labs Reviewed and Significant for:  CBC  No results found in last 36 hours       Assessment and Plan  Kayla Frey is a 37 y.o. G1P0000 at [redacted]w[redacted]d here for IOL for cHTN in early labor. Now in active labor. 8/<0.5/-1. Since last significant event, patient has made cervical change. She and her family were anxious about the lack of progress in her labor course but were overall very encouraged about her cervical change. Plan to continue the pitocin as the FHT tolerates and decrease/stop when needed. 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 On Valtrex since 36w. No prodrome, no lesions on admission SSE.    cHTN  Assessment & Plan  Since 2022, BP of 130-150/70-90. MRBPs at initiation of PN care 09/2022, then 12/2022 elevation to 160/68 in clinic, started on nifed 30mg  XL daily. At goal BP since. Pre-E labs normal.     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 FB out. Pitocin tarted   1640 4/1/-1, AROM to clears, FSE, IUPC placed  05/14/23   0154 4/1/-2   1610 5/1/-1   0910 5/1/-2, 3 min decel (resolved w position changes and pit off)   1400 8/<0.5/-1    Fetal Well Being:   Cat II intermittent variables baseline 130s moderate variability       Pain Control:  Epidural    Mode of Delivery:  Expect NSVD        Daneen Schick, MD (PGY-1)  [facility]   [phone]

## 2023-05-14 NOTE — Nursing Note (Signed)
Strip reviewed with Dr Temple Pacini.  Continue with induction per MD.

## 2023-05-14 NOTE — Progress Notes (Signed)
OBSTETRICS LABOR PROGRESS NOTE     Subjective  Sleeping comfortably  Some chronic R shoulder pain which hurts her when she sleeps on R side    Vitals  Temp:  [36.5 C-36.9 C] 36.7 C  Heart Rate:  [59-109] 68  *Resp:  [18-20] 18  BP: (108-150)/(53-78) 127/57  SpO2:  [95 %-100 %] 98 %    Physical Examination    Cervix:  *Dilation: 5  *Effacement (cm): 1  *Station: -1    Membrane Status:  Rupture Date: 05/13/23  Rupture Time: 1605    Fetal Heart Rate Tracing:  Baseline rate: 130 bpm  Variability: Minimal  FHR Changes: Accelerations;Late decelerations    Tocometer:   Uterine Activity  $ Mode: IUPC;Palpation  Contraction Frequency: 2-4  Contraction Duration: 60-90  Contraction Quality: Moderate  Resting Tone Palpated: Soft  MVU's (mmHg): 63-113  Resting tone (mmHg): 0-3    Data  Labs Reviewed and Significant for:  CBC        05/12/23  2150   WBC 11.0*   HGB 10.7*   HCT 31.9*   PLT 302        Assessment and Plan  Kayla Frey is a 37 y.o. G1P0000 at [redacted]w[redacted]d here for IOL for cHTN in early labor.    Progressed to 5/1/-1. Some intermittent late decels when lying on L side though is patient's preferred side d/t having chronic R shoulder pain. Will continue to reposition as thus far, strip has been Cat I when patient on R side. Continue pit per protocol, next check 4h or sooner PRN.        * IOL cHTN  Assessment & Plan  Labor:  05/12/23   2100 admitted   2315 c/l/h, 60cc FB placed  05/13/23   0342 miso x3    0920 3/2/-2 s/p miso x 5. FB out. Pitocin tarted   1640 4/1/-1, AROM to clears, FSE, IUPC placed  05/14/23   0154 4/1/-2   0620 5/1/-1    Fetal Well Being:   Cat II intermittent variables baseline 130s moderate variability       Pain Control:  Epidural    Mode of Delivery:  Expect NSVD      cHTN  Assessment & Plan  Since 2022, BP of 130-150/70-90. MRBPs at initiation of PN care 09/2022, then 12/2022 elevation to 160/68 in clinic, started on nifed 30mg  XL daily. At goal BP since. Pre-E labs normal.     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 On Valtrex since 36w. No prodrome, no lesions on admission SSE.    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

## 2023-05-14 NOTE — Progress Notes (Addendum)
Significant Event Note    05/14/23    At bedside at 09:06 for 1 minute decel that resolved with position changes. Shortly after, at around 09:09, another prolonged decel began. Position changes were not resolving this new decel so pitocin was stopped. Terbutaline was drawn but was NOT given. FHT resolved after 3 minutes. Pit remained off. Cervical exam remains unchanged.         Plan to continue off pitocin and restart when able to tolerate.     Decel managed with attending Lovenia Shuck CNM    Daneen Schick, MD (PGY-1)  [facility]   [phone]

## 2023-05-14 NOTE — Progress Notes (Signed)
Significant Event Note    05/14/23    At bedside at 12:16 for 1-2 minute decel that nadirs to the 90s. This resolved with turning off pitocin. Patient currently in the right lateral decubitus position.             Plan to continue off pitocin and restart when able to tolerate.     Decel managed with attendings Lovenia Shuck CNM and Leeroy Cha MD    Daneen Schick, MD (PGY-1)  [facility]   [phone]

## 2023-05-15 LAB — COMPLETE BLOOD COUNT
Hematocrit: 29 % — ABNORMAL LOW (ref 36.0–46.0)
Hemoglobin: 10 g/dL — ABNORMAL LOW (ref 12.0–15.5)
MCH: 31.1 pg (ref 26.0–34.0)
MCHC: 34.5 g/dL (ref 31.0–36.0)
MCV: 90 fL (ref 80–100)
MPV: 10.7 fL (ref 9.1–12.6)
Platelet Count: 247 10*9/L (ref 140–450)
RBC Count: 3.22 10*12/L — ABNORMAL LOW (ref 4.00–5.20)
RDW-CV: 13.3 % (ref 11.7–14.4)
WBC Count: 20.2 10*9/L — ABNORMAL HIGH (ref 3.4–10.0)

## 2023-05-15 LAB — TYPE AND SCREEN
ABO/RH(D): O POS
Antibody Screen: NEGATIVE

## 2023-05-15 LAB — ALANINE TRANSAMINASE: Alanine transaminase: 9 U/L — ABNORMAL LOW (ref 10–61)

## 2023-05-15 LAB — CREATININE, SERUM / PLASMA
Creatinine: 0.95 mg/dL (ref 0.55–1.02)
eGFRcr: 80 mL/min/{1.73_m2} (ref >59)

## 2023-05-15 LAB — ASPARTATE TRANSAMINASE: AST: 24 U/L (ref 5–44)

## 2023-05-15 MED ORDER — OXYCODONE 5 MG TABLET
5 mg | ORAL_TABLET | Freq: Three times a day (TID) | ORAL | 0 refills | Status: DC | PRN
Start: 2023-05-15 — End: 2023-05-21

## 2023-05-15 MED ORDER — DOCUSATE SODIUM 100 MG CAPSULE
100 mg | ORAL_CAPSULE | Freq: Every day | ORAL | 0 refills | Status: DC
Start: 2023-05-15 — End: 2023-08-14

## 2023-05-15 MED ORDER — ACETAMINOPHEN 500 MG TABLET
500 | ORAL_TABLET | Freq: Four times a day (QID) | ORAL | 0 refills | Status: AC | PRN
Start: 2023-05-15 — End: ?

## 2023-05-15 MED ORDER — CABERGOLINE 0.5 MG TABLET
0.5 | Freq: Once | ORAL | Status: AC
Start: 2023-05-15 — End: 2023-05-15
  Administered 2023-05-15: 21:00:00 1 mg via ORAL

## 2023-05-15 MED ORDER — NIFEDIPINE ER 60 MG TABLET,EXTENDED RELEASE 24 HR
60 mg | ORAL_TABLET | Freq: Two times a day (BID) | ORAL | 0 refills | Status: DC
Start: 2023-05-15 — End: 2023-07-07

## 2023-05-15 MED FILL — NIFEDIPINE ER 60 MG TABLET,EXTENDED RELEASE 24 HR: 60 60 mg | ORAL | Qty: 1

## 2023-05-15 MED FILL — DOCUSATE SODIUM 250 MG CAPSULE: 250 mg | ORAL | Qty: 1

## 2023-05-15 MED FILL — ACETAMINOPHEN 500 MG TABLET: 500 mg | ORAL | Qty: 2

## 2023-05-15 MED FILL — CABERGOLINE 0.5 MG TABLET: 0.5 0.5 mg | ORAL | Qty: 2

## 2023-05-15 MED FILL — TRINATAL RX 1 60 MG IRON-1 MG TABLET: 60 mg iron-1 mg | ORAL | Qty: 1

## 2023-05-15 MED FILL — NIFEDIPINE ER 30 MG TABLET,EXTENDED RELEASE 24 HR: 30 30 mg | ORAL | Qty: 1

## 2023-05-15 MED FILL — OXYCODONE 5 MG TABLET: 5 5 mg | ORAL | Qty: 1

## 2023-05-15 MED FILL — BURN RELIEF WITH ALOE 0.5 % TOPICAL SPRAY: 0.5 0.5 % | TOPICAL | Qty: 127

## 2023-05-15 NOTE — Anesthesia Post-Procedure Evaluation (Signed)
Anesthesia Post-op Evaluation    Scheduled date of Operation: @ORDATE @    Scheduled * No surgeons listed *  Scheduled * No procedures listed *    Final Anesthesia Type: spinal, epidural    Assessment  Respiratory Function:      Airway Patency: Excellent      Respiratory Rate: See vitals below      SpO2: See vitals below      Overall Respiratory Assessment: Stable  Cardiovascular Function:      Pulse Rate: See Vitals Below      Blood Pressure: See Vitals Below      Cardiac status: Stable  Mental Status:      RASS Score: 0 Alert or calm  Temperature: Normothermic  Pain Control: Adequate  Nausea and Vomiting: Absent  Fluids/Hydration Status: Euvolemic    Complications (anesthesia/case associated complications, possible complications, and/or significant issues; as of time of note completion: No apparent complications      Plan  Follow-up care: As per primary team    Post-op Note Status: Complete, patient participated in evaluation, which occurred after recovery from anesthesia but prior to 48 hours from end of case        Recent Pre-op and Post-op Vital Signs  Vitals:    05/14/23 2122 05/15/23 0030 05/15/23 0505   BP: (!) 144/81 136/73 125/50   Pulse: 90 90    Resp: 18 16 18    Temp: 36.6 C (97.9 F) 37 C (98.6 F) 36.9 C (98.4 F)   TempSrc: Oral Oral Oral   SpO2: 97% 98% 99%     Last Vital Signs Out of Room to Anesthesia Stop  Vitals Value Taken Time   Pulse 93 05/14/23 1724   Resp     SpO2 99 % 05/14/23 1724   BP 139/60 05/14/23 1720   Arterial Line BP (mmHg)      Arterial Line MAP (mmHg)     Arterial Line 2 BP (mmHg)     Arterial Line 2 MAP (mmHg)     Temp     Temp src     Vitals shown include unfiled device data.    Case Tracking Events:  Event Time In   Anesthesia Start 05/13/2023 1358   Anesthesia Finish 05/14/2023 1728

## 2023-05-15 NOTE — Assessment & Plan Note (Addendum)
Post care: meeting some goals, desires lacation suppression medication today.   Hemodynamics: continues to have MRBP's despite on bp meds.   05/15/23: current regiment started 05/14/23 at 2000  Will CTM and consider another agent for blood pressure management.   [  ] repeat labs at 1700 05/15/23  Pain: well controlled  MoF: breast  MoC:  condoms  PNL:   Lab Results   Component Value Date    ABO/RH(D) O POS 05/15/2023    Rubella Antibody 5.60 10/29/2022      Vaccinations:   Immunization History   Administered Date(s) Administered    Influenza 07/26/2021    Pfizer Bivalent Covid (12+) 07/24/2021    Pfizer Sars-Cov-2 Vaccine (Purple Top) 06/23/2020, 08/02/2020    RSV, Bivalent PF 05/01/2023    Tdap 11/27/2017, 03/05/2023     Dispo: likely home on PPD# 2, baby in NICU on cooling  Req 1 week bp check 05/14/23 has 6 week PP visit scheduled  [  x] meds sent to pharm, desires flu vaccine prior to discharge

## 2023-05-15 NOTE — Plan of Care (Signed)
Problem: Discharge Planning - OB  Goal: Knowledge of and participation in plan of care  Outcome: Progress within 12 hours     Problem: Adjustment to Parenting - Postpartum Patient - OB  Goal: Able to perform infant care tasks  Outcome: Progress within 12 hours  Goal: Parent-infant bonding initiation  Outcome: Progress within 12 hours     Problem: Breast Milk Expression / Pumping - Postpartum Patient - OB  Goal: Effective pumping of breastmilk  Outcome: Progress within 12 hours  Goal: Effective hand expression of breastmilk  Outcome: Progress within 12 hours

## 2023-05-15 NOTE — Discharge Summary (Shared)
[facility] - DISCHARGE SUMMARY     Patient Name: Kayla Frey  Patient MRN: [id number]  Date of Birth: Jan 21, 1986    Facility: [facility]  Attending Physician: Sigmund Hazel, MD  Delivery date: 05/14/2023     Date of Admission: 05/12/2023  Date of Discharge: {DO NOT INSERT DISCHARGE DATE UNTIL DATE CONFIRMED:[id number]}    Admission Diagnosis: ***  Discharge Diagnosis: Encounter for induction of labor    Discharge Disposition: {Adult Discharge Disposition:30420587::"Home"}    History (with Chief Complaint)      Antepartum Course:  History of Present Illness  Kayla Frey is a 37 y.o. G1P0000 at [redacted]w[redacted]d. She presents for scheduled IOL for cHTN.  Intrapartum Course:  Kayla Frey is a 37 y.o. G1P1 who presented at [redacted]w[redacted]d.  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 Upon admission, her SVE was c/l/h and she was intact. She was GBS positive and received intrapartum ampicillin. She used an epidural for pain control. Intrapartum course was complicated by multiple episodes of fetal decelerations that improved with position changes and with turning off the pitocin. She did not require any dose of terbutaline. She progressed through labor slowly but ultimately progressed to complete at 1653 on 9/19. She pushed for approximately 30 minutes. Once the head was crowing, the neonate began having a deceleration down to the 80-90s. An external fetal monitor was used to confirm FHR. She was recommended to continue pushing outside of a contraction given this deceleration and the neonate's head delivered in the DOA position within one push, followed by anterior shoulder, posterior shoulder, and body without difficulty. There was a nuchal cord and was reduced. Pediatrics was already present in the delivery given the decels throughout intrapartum course. Neonate had poor tone and pallor so cord was immediately clamped and cut and handed to pediatrics for evaluation. Active  management of the third stage was initiated and placenta was delivered intact. Perineum was inspected and found to have a 1st degree perineal laceration, repaired with a 3-0 Vicryl Rapide in the standard fashion. Sponge, lap, and needle counts were correct.      ID Labs (Last result from the past 8760 hours)             03/06 1134     Hep B surf Ag       NEG                   NEG           HIV Ag/Ab, 4th Gen       NEG  Comment: (Reported by Lab to [employer]. Physician reporting also required.)                      Pending Labs            Order Current Status     Surgical Pathology Specimen Source (enter 1 per line): placenta Collected (05/14/23 1808)                   Findings:   - Vertex, spontaneous  - viable female infant, apgars - 2/4, 6 lb 7.2 oz (2925 g)  - QBL 250 mL   - Lacerations: 1st    - Cord gases:             05/14/23  1724   SRCE Capillary   PH37 7.13   PCO2 61*   HCO3 20*   BEX  Neg 9.0      - Placenta: centric, 3v cord  - Delivery Attending: Kathleen Argue, Delivery assistant Pershing Cox, [place]     Labor:  IOL: Yes     TOLAC:  No     Delivery Kayla Frey    Postpartum Course:  ***  Patient was meeting all of her postpartum goals at the time of discharge on postpartum day # 2    Vitals  Temp:  [36.6 C-37.3 C] 36.7 C  Heart Rate:  [81-106] 87  *Resp:  [16-20] 18  BP: (107-169)/(50-98) 133/71  SpO2:  [91 %-100 %] 99 %    Physical Exam  Gen: well appearing  Chest/Breasts: nipples intact, breasts soft  Fundus: firm, below umbilicus  Perineum: intact   Ext: 1+ edema    Relevant Labs, Radiology, and Other Studies  ***    Prenatal Labs  Lab Results   Component Value Date    ABO/RH(D) O POS 05/15/2023    Antibody Screen NEG 05/15/2023    Rubella Antibody 5.60 10/29/2022    RPR Nonreactive 05/12/2023    Hep B surf Ag NEG 10/29/2022    Hep B surf Ag NEG 10/29/2022    Glucose Loading Screen 117 10/29/2022     ID Labs (Last result from the past 8760 hours)        03/06 1134    Hep B surf Ag       NEG                 NEG         HIV Ag/Ab, 4th Gen       NEG  Comment: (Reported by Lab to [employer]. Physician reporting also required.)               Pending Labs       Order Current Status    Surgical Pathology Specimen Source (enter 1 per line): placenta Collected (05/14/23 1808)              Procedures Performed and Complications  Please see hospital course above.    Discharge Diet  Breast Feeding Diet    Functional Assessment at Discharge/Activity Goals  Put nothing in the vagina for 6 weeks.  Return slowly to activities as tolerated.  Do not lift anything heavier than 15 pounds for 6 weeks.    Method of Feeding: pumping baby in NICU  Method of contraception: Condoms & spermicide    Allergies and Medications at Discharge    Allergies: Adhesive, Iodine, Shellfish derived, Tree nuts, and Morphine    {DO NOT INSERT MED LIST UNTIL DISCHARGE MED REC IS COMPLETE - ON DAY OF YNWGNFAOZ:[id number]}    Pending Tests   1 week bp check    Follow-up Needs for the Primary Care Physician  Blood pressure, on going mental health needs         Outside Follow-up     Outside Follow Up: None    Booked [place] Appointments  Future Appointments   Date Time Provider Department Center   05/28/2023  3:00 PM Debroah Baller, MD [facility]   06/26/2023  2:30 PM Arianna Burman Nieves, MD [facility]       Pending [place] Referrals  None    Case Management Services Arranged  Case Management Services Arranged: (all recorded)             Discharge Assessment  Condition at discharge:  good       Primary Care Physician  Cottie Banda  Address: [address]  / [place] [address]   Phone: [phone]  Fax: [phone]     Outside Providers, for pending tests please use the following numbers:   For [facility] - Please Call: [phone]    For [facility] - Please Call: [phone]   For [facility] - Please Call: [phone]    Signed,  Elmer Picker,  CNM  05/16/2023        Additional Discharge Instructions Provided to the Patient (if any):          Patient Instructions             Discharge Instructions Vaginal Birth    Postpartum Care and Recovery: The First Six Weeks - [place]  Before you leave the hospital your provider will tell you when to schedule a postpartum visit in the office. Usually this will be in about six weeks. You should, however, call your provider anytime you have any concerns. Some concerns to call for include severe bleeding, anywhere in your body, severe pain, fever, blurred vision or visual changes, depression, or anxiety.     When to call your provider:  Increase in bleeding: Soaking >1 maxi pad/hour; passing >walnut size clots  Foul smelling discharge  Fever >100.4 F (38 C)  New or worsening pain, that is not helped by pain medicine  Problems with urinating or emptying your bladder  Severe unusual headache, blurred or double vision.  Pain anywhere in your body that is worsening or severe.  Suspected depression or anxiety.   Please call the office of your prenatal care.   Obstetric Clinic Phone Number: [phone]    If your clinic is closed and you are local to [facility], please call  The triage to have questions answered or to be seen at: [phone] ex. 2.  Vaginal Bleeding    It is normal to have some bleeding or discharge for six weeks or more after giving birth. At first, the bleeding is bright red and heavier than a period. Within two weeks, the flow should lighten so you need only a mini pad. The color may be pink, brown, or red. The bleeding may stop for a few days and then start again. This is normal and may depend on how active you were during the day.   Stitches and Hemorrhoids    After giving birth, women may have some tears, swelling around the vagina and/or hemorrhoids. Ice packs will help with swelling and discomfort during the first day. By the second day, switch from ice to sitz baths, soaking in warm water in a  cleaned bathtub or in a plastic basin that fits in the toilet.(sitz bath). Continuing these soaks once or twice a day for 5-10 days will lower pain, may prevent infection and may speed up the healing process by bringing more blood to the area. Your nurse will show you how to use a sitz bath.  If your tears required stitches, the stitches will dissolve and do not need to be removed.   Hemorrhoids are generally aggravated by pregnancy and also during pushing in labor. They will start to improve after birth. Warm soaks (sitz baths) which end with either soaking in cold water or a tiny ice pack just for the hemorrhoid can help shrink hemorrhoids.  Avoid constipation by drinking plenty of water and eating  fiber (such as bran cereal, ground flaxseeds, and fruits and vegetables) every day.  Taking a stool softener, which is available over the counter, also may help.  Pain/Discomfort    It is normal to have pain and discomfort after giving birth. Most often women have cramping that may be similar or worse than menstrual cramps. The cramps may occur before or during breastfeeding as your body releases hormones.    There also may be pain/soreness in your vagina and perineum (area between the vagina and the bottom). This will get better over time and using comfort measure such as ice day one, warm water after day one, and rest will help. You can take Ibuprofen (Motrin, Advil), Acetaminophen (Tylenol) or other pain medications as prescribed by your provider.   Bladder Function    Due to muscle stretching, temporary nerve damage and hormonal changes that occur during pregnancy and childbirth, some women may have trouble holding their urine. This means having trouble holding urine long enough to make it to the bathroom when the urge strikes (urge incontinence) or losing urine when they cough, sneeze or run (stress incontinence).  Time will take care of much of this and you should notice slow steady improvement as the weeks go by.  Training your bladder by going to the bathroom every 2-3 hours may help you better control your urine.  Performing Kegel exercises can also help with incontinence problems. It is recommended to do kegals exercises sitting, standing, and walking (do kegals in different positions) up to 60x/day. Lowella Dell can be done throughout the day.   Diet    Continue to eat as you have during pregnancy. You need some calcium in your diet, and do not need to take extra calcium.  You can continue taking your prenatal vitamins or multivitamin. Drinking fluids to thirst  (usually about 8 glasses per day) are important to replace the blood lost during childbirth, to make breast milk, and avoid constipation.  Sleep    Sleep deprivation is a challenge for all new parents. While there is no way to get the uninterrupted sleep you probably want, make sure that you get as much as you can. Pain, frustration and depression are all aggravated by lack of sleep. Whenever possible have another adult at home with you during the first few weeks to help take care of your baby and help you get some extra rest. While many people will ask if you are getting your baby to sleep through the night, remember that it is normal for babies to awaken frequently, usually to eat, for up to one year. They usually drop one of their night feeds at 6 weeks. You should know that night feeds are normal and that it is healthy for babies to awaken frequently. Breastfeeding and awakening frequently (as often as every 2 hours) helps prevent Sudden Infant Death Syndrome.    Sexual Activity    Bleeding, healing stitches, fatigue, hormonal changes and the demands of breastfeeding all affect a womans comfort and interest in sexual activity. Only you can decide when you are ready. Emotional support and affection are always important. We recommend that you wait in beginning sexual activity until after seeing your provider.  The vagina may be dryer after giving birth, particularly  for breastfeeding women. Using a lubricant may make intercourse more comfortable. Lubricants are sold over the counter in the drugstore, usually in the condom section. Do not use oils or Vaseline as they do not wash away well and can damage condoms.  Although exclusive breastfeeding can reduce the chances of getting pregnant in the  first 6 months, if you dont supplement and nurse round the clock, it does not completely protect against pregnancy. Using contraception for intercourse is recommended if you do not want to get pregnant again right away. Health wise, waiting a year to become pregnant allows your body to recover, including replacing your iron stores, and losing the pregnancy weight.   Condoms are always an option for protection against pregnancy. Read contraception information and talk with your provider about other options to prevent pregnancy.  Activity and Exercise    After nine months of being pregnant, many women are eager to get their bodies back. However, just as it took those nine months to give birth, it takes time to feel and look like you again. There are certain changes that need to occur and cannot be sped along. It takes time for the uterus to shrink, for your body to lose the extra fluid and hormone levels to return to normal.  The activities of daily living that you do to care for yourself and your baby around home are plenty of activity in the first few weeks. Any spare time should be spent sleeping and off your feet. This will allow stitches to heal, bleeding to slow down and your body to recover.  Kegel exercises can be started anytime.  After your postpartum checkup, you may begin gradually to add other exercise.    [facility] (PPV) Support: Postpartum Online Support Group for [facility] Patients    -Listen and share your story as a parent of a newborn, from the comfort of your own home.  -This postpartum online support group is offered on a drop-in basis. Our goal is to support  our postpartum patients who are facing the ups and downs of being a parent, healing and connecting with other families especially during the uncertainty of the COVID-19 pandemic.  -Facilitated peer support Strategies for postpartum recovery  -Practice of stress reduction techniques, such as mindfulness.  This support group is not intended to treat direct postpartum medical conditions, but a referral can be provided if needed.  Facilitated by Lovenia Shuck, CNM, and/or Andrey Farmer MD  Via zoom on Weds 1:30-2:45  To register, call [phone]       HIGH BLOOD PRESSURE IN PREGNANCY AND FOLLOW-UP    You received a diagnosis of high blood pressure in pregnancy. This can include a range of disorders:  Chronic hypertension:  high blood pressure that was present before you got pregnant  Gestational hypertension:  high blood pressure that started in the second half of pregnancy, typically late in the 3rd trimester  Pre-eclampsia: High blood pressure plus protein in your urine. If pre-eclampsia is severe, women can have symptoms like headache or vision changes, abnormal kidney, lung, or liver function and other complications. When a woman with chronic hypertension is affected, it is called superimposed pre-eclampsia. When a woman with high blood pressures in pregnancy has a seizure, it is called eclampsia.    While high blood pressure due to pregnancy usually resolves once the pregnancy ends, it might not go away for the first 6 weeks after delivery. We want to follow you closely after delivery, as it is possible for the elevated blood pressure could return or even worsen after delivery.     FOLLOWING YOUR BLOOD PRESSURE AFTER DELIVERY  We want to follow your blood pressures post delivery in case they worsen. Return to [facility] clinic for blood pressure check 7-10 days after your delivery. Please call the clinic where you  had your prenatal care for a blood pressure check if you are not contacted by the clinic for this  appointment.      While checking your blood pressure at home, please call [phone] if your blood pressures are 150 or higher on the top or 100 or lower on the bottom. Seek immediate emergency attention if your blood pressure is above 160/110.    DANGER SIGNS  Call your clinic, or [phone] if the clinic is closed, if you have any of the following:  Headache that doesn't resolve with pain medicine, eating, drinking fluids or rest.   Visual changes including blurry vision, tunnel vision, loss of vision, (especially one sided) or seeing floaters/squiggly lines.  Abdominal pain located in the upper part of your abdomen.     EMERGENCY SYMPTOMS  SEEK IMMEDIATE ATTENTION IF YOU HAVE ANY OF THE FOLLOWING:  Localized weakness of one part of your body  Trouble speaking or understanding  Difficulty breathing  Seizures  Loss of consciousness  YOUR SIX-WEEK CHECK-UP  Your 6-week check-up is important for your general health, to check on your recovery, and to be sure that your blood pressure is normal.  If you had pre-eclampsia and are interested in getting pregnant again, talk to your OB/GYN about ways to reduce the risk of getting pre-eclampsia with the next pregnancy.    LONG-TERM FOLLOW UP  Hypertensive disorders in pregnancy may increase your risk for heart disease in the future, so it is crucial to follow-up with your doctor regularly. Pre-eclampsia increases your risk for developing:   High blood pressure   Heart attacks  Stroke   Blood clots (that may travel to your lungs)  Kidney disease     Regular check-ups with your primary care doctor may decrease your risk. Together you can discuss prevention strategies such as lifestyle changes (eating a balanced healthy diet, exercising regularly, limiting alcohol, avoiding smoking) or medications if needed to reduce your risk of heart disease and promote a long and healthy life!     If you do not already have a PCP, it is ideal that you get one to follow your health. To  schedule your first appointment, call:  [facility]: [phone]  [facility]: [phone]    What can you do to reduce your risk for high blood pressure?  Stay physically active. Exercise 150 minutes each week.  Maintain a healthy weight.  Eat a healthy diet high in vegetables and fruits and limit your salt.  Have your blood pressure checked regularly and manage high blood pressure.  Take blood pressure medication exactly as directed.           MONITORING YOUR BLOOD PRESSURE AT HOME    Please check and write down your blood pressure each day at around the same time.  Example:  110    70    Before you check your blood pressure, please make sure you:  Do not have any caffeine (coffee, tea, soda, energy drinks, etc.) or tobacco for 30 minutes before checking  Sit down and rest for at least 5 minutes before checking  Have your arm with the blood pressure cuff at the level of your heart, be sitting with your feet on the floor, your legs uncrossed, and your back supported    Date/Time            Blood Pressure              Date/Time  Blood Pressure              Date/Time            Blood Pressure              Date/Time            Blood Pressure                Reminder: If you have a HEADACHE that does not go away with acetaminophen (Tylenol), SPOTS OR CHANGES IN YOUR VISION, or RIGHT UPPER BELLY PAIN, please call the OB Clinic at [phone] during the day or the [facility] triage at [phone] during holidays, nights and weekends.    Current BP   Medications:          What do I do with my blood pressure readings?    If your blood pressure reading is:  LESS than 140 (on top)  & LESS than 90 (on the bottom)  Your blood pressure is well controlled!      If your blood pressure reading is:   BETWEEN 140 & 159  Or BETWEEN 90 & 105 (on the bottom)  Your blood pressure needs some attention soon!     Please call the OB Clinic at [phone] during the  day or the [facility] Triage at [phone] during holidays, nights and weekends to let us know and get help!      If your blood pressure reading is:   GREATER than 160 (on top)  Or GREATER than 106 (on the bottom)                    You may have a dangerous situation with your blood pressure!    Please call the OB Clinic at [phone] during the day or the [facility] Triage at [phone] during holidays, nights and weekends. You will likely need to come in to the Birth Center to be seen.             Anxiety and depression are the most common complications of pregnancy.     Postpartum Support Physiological [email or web address]  [phone]    Please know there are resources out there to help you be well!      TEN FACTS ABOUT DEPRESSION and ANXIETY in pregnancy and postpartum    1. You are not alone. You are not to blame. With help, you will feel better.  Anyone can become depressed or anxious during pregnancy or after the baby comes. It is not your fault. It is caused by many stresses happening at the same time. Many women develop depression or anxiety because of changes in our hormones, our feelings, our relationships, and sometimes in stress about work,  housing, or money. No matter how sad or scared you feel, you can get through this with help.    2. You need regular breaks from taking care of your children and your house.  You need to get breaks to feel good about the hard work of being a mother. Taking a break will help you do a better job of being a mother, and it will help you feel better.    3. Perinatal depression and anxiety do not go away fast: there is no quick fix or cure. You WILL feel better if you keep taking steps to get help and to take care of yourself. It is hardest in the beginning, and it will get easier. Find a healthcare provider you like,  people who can help you at home, and friends that listen. Dont give up.    4. You will feel better if you reach out to understanding people  and say how you feel. Women who go through depression without help are more unsure about themselves as mothers. Talking to other women who have recovered will help you a lot. It is normal to feel shy and embarrassed at first, but it will help you to talk to someone who understands.    5. You will feel worse if you judge your life on a bad day. On a bad day, we see things negatively and feel worse by judging our lives and ourselves. Make a rule  that you will not judge yourself on a hard day. Instead, fill your day with things that help you: get active, go outside, express your feelings, have a good cry, or listen to music. Do not compare yourself to other women, and try not to compare your partner, your body, your home, or your children to others.    6. You will find what works for [employer]. It might be different from what works for other women. Remember that different people need different solutions. This is true about medication, nursing, where the baby sleeps, how you teach your children, and where you get support. Be open to changing your plans so that you can find the things that work best for you and your family.    7. You will feel better if you get outside as much as you can. Even a little bit helps.    8. Recovery from perinatal depression or anxiety has ups and downs. There are good days, bad days, and boring days. If you keep to a plan of self-care, breaks, support, and remedies, you will keep feeling better. It is normal to worry if you have a bad day after youve been feeling better. Dont give up. You will get through this. When you have a bad day, think about your last few days. Did you get any time to yourself? Did you do too much? Are you angry? Did the baby grow a lot or make a change in nursing? Have you been eating well?    9. Be true to yourself, and trust that you will find your way. Your feelings or thoughts do not hurt your baby. How you act does matter. It is normal to cry and feel mad,  frustrated, scared, or to feel nothing inside. But try to focus on what you are doing on the outside. Children feel good when you look in their eyes, let them know they are safe with you, hold them and smile when you can. Take breaks so that you will be more relaxed when you are with them.    10. Good mothers can get depressed. Depression can make women feel bad and afraid about motherhood. They get afraid that they will never be happy. But that is the depression, and when it gets better, you will feel better about being a mom. Be kind to yourself. See if you can accept ALL your feelings and remember that good mothers can feel sad, scared, or bored sometimes. Depression will not last forever. Spend time with people who make you feel  good about yourself and hopeful about the future. You will feel better, and you are not alone.      FOR MORE INFORMATION, PLEASE VISIT: [email or web address]    (Handout by Letitia Libra, PhD, [employer]. With permission)   Perinatal  Therapists     [place]:     Lavada Mesi, PhD  [address]    [phone]     Eula Fried. Cilia, MA, LMFT  [email or web address]   [address]   [phone]   [email or web address]      Rolley Sims, MFT  [email or web address]  [address] (also [place] office at [facility])   [phone]  [email or web address], [email or web address]  [email or web address]  [address]   [phone]  [email or web address]    Lauretta Grill, PsyD   [email or web address]  [address]  [phone]   [email or web address]    Radford Pax, PsyD  Office is one Optometrist at [facility] in [place]  [phone]    Dola Factor, PsyD  [email or web address]  [phone]    Quillian Quince, LCSW  [email or web address]  [address], [place] [address]  [phone]    Donnelly Angelica, MFT  [email or web address]  [address], [place] [address]  [phone]  [email or web address]    Everlene Balls, Ph.D  [email or web address]  [address]  [phone]  [email or web address]     [place]:  Graciela Husbands, LMFT  [email or web address]    [facility]:    Arna Medici, PhD  [address] [place], [place]  [address]  and [address] [place], [place]  [address]  [phone]  [email or web address]    Glean Hess, PhD  [facility] Office:  [address]  [place], [place] [address]  Email to: [email or web address]  Phone: [phone]    Derald Macleod, MFT  [address], [place], [place] [address]     [phone]  [email or web address]

## 2023-05-15 NOTE — Anesthesia Post-Procedure Evaluation (Signed)
OB Post-Anesthesia Care Progress Note    OB Post-Anesthesia Care Progress Note    S:    Patient: Kayla Frey    Encounter type:  In person    Post-op Actions:  Eating, Drinking, Walking and Urinating    Breast feeding status:       Complaints:   no complaints    Neurological Complaints:  no complaints      O:  Vital signs are stable.    -------------------------------------------              05/15/23        05/15/23                     0030            0505         -------------------------------------------   BP:         136/73          125/50         Pulse:        90                           Resp:         16              18           Temp:   37 C (98.6 F)36.9 C (98.4 F)   TempSrc:     Oral            Oral          SpO2:         98%             99%          Weight:                                    Height:                                   -------------------------------------------    She is awake and alert and resting comfortably in bed.  Neuraxial anesthesia site is non-tender, clean and intact. She has no Infection and no Hematoma.      A/P:    [name] is a 37 y.o. woman who is Post NSVD day 1. She received Epidural anesthesia care during labor/procedure. Please contact us with any new questions or concerns.    Follow up care needed: Follow up care not needed.       Delivery Information: (from electronic health record)  [place], [place] [[id number]]      Newborn Delivery    Birth date/time: 05/14/2023 17:24:18  Delivery type: Vertex, spontaneous       Placenta    Placenta delivery date/time: 05/14/2023 1728  Placenta removal:

## 2023-05-15 NOTE — Interdisciplinary (Signed)
PEDIATRIC SOCIAL WORK PSYCHOSOCIAL EVALUATION    Discharge Hold/Alert: No    Pt, Kayla Frey, is a 37 y.o woman G1P1 admitted to PP.  Her newborn baby was admitted to the ICN.         Data:     Insurance: Payor: CCS/M-CAL / Plan: [place] CCS/M-CAL / Product Type: Medi-Cal Standard /      Interpreter needed? No    Mode of Contact: In-person  Accompanied by: Pt's partner  Source of information: Pt, Chart Review  Services/Teams: Neonatal Intensive Care  Referral Source: Automatic    PATIENT INFORMATION   Current Residence: Apartment/ [facility] of Residence: [address]  Support Systems: Kayla Frey, Other family members, Grandparent(s)  Current Services in Place:  To further assess    FAMILY INFORMATION  Family Language: English  Income Source: Other (see comment) (Did not assess today)  Parent Marital Status: Domestic Garment/textile technologist Considerations: Pt reports being spiritual  Family Strengths: Emotional support, Social support, Motivation for treatment and growth, Spiritual support, Ability for insight, Engaged with community resources    Intact couple, Kayla Frey and Kayla Frey, from [place].  Baby "Kayla Frey" is pt's first baby and Kayla Frey has a 1 year old son.      SOCIAL DETERMINANTS OF HEALTH (SDOH)  No risks identified in today's meeting.    Financial Resource Strain: Not on file     Food Insecurity: Not on file     Transportation Needs: Not on file       LEGAL CONSIDERATIONS  Both pt and partner can provide medical consent for baby.        EDUCATION INFORMATION  N/A       PERTINENT CONTACTS/COLLATERAL:   N/A    MENTAL HEALTH  Pt with hx of anxiety and depression. She has been receiving follow up from her primary physician and plans to restart medications.       SUBSTANCE USE  No substance use identified in the chart or reported in today's meeting.       Assessment:     Patient/Family is adjusting to the diagnosis and treatment.  Patient/Family is adjusting to hospitalization and  receptive to social service intervention.  Patient/Family has adequate support.  Patient/Family appears to be coping appropriately.  Patient/Family is accessing support.  Patient/Family will benefit from ongoing social service intervention and support.    Pt's baby is a 13 days old female who was admitted to the ICN/[facility] [place] team for RDS and therapeutic cooling.      Social Worker (SW) facilitated psychosocial assessment with patient at bedside and oriented her to role of social work, resources, and Museum/gallery conservator.  Father had stepped away and plans to visit later today.     Pt shared events leading to current hospitalization and current coping, which appeared to be within normal limits. SW provided empathy and support, and acknowledged pt's experience. SW discussed the importance of support systems and self-care, and encouraged pt's abilities. SW provided psychoeducation around baby blues and PPD. Pt reports ample support in place.     SW reviewed hospital and unit resources, including discounted parking vouchers and [employer],. Upon pt's discharge from postpartum unit, family plans to return home and visit daily.    SW to review other referrals including [employer] SW to remind mother to add baby to insurance.     Pt reported no additional needs at this time. SW provided her with SW's direct contact information for  further needs/referrals. SW to continue following for duration of admission.     Interventions/ Follow-up plan:     Interventions Provided: Psychosocial Assessment, Supportive counseling, Mental Health, Orientation to Social Work services  Mental Health Interventions: Anxiety assessment, Depression assessment    SW provided pt with two free parking stickers.     SW to provide necessary community  referrals.     SW to remain involved.     [address] [place], [place]  [phone]

## 2023-05-15 NOTE — Interdisciplinary (Signed)
Spiritual Care Services    Missed Visit: Chaplain attempted to visit patient and patient's visitor(s) at bedside but patient was otherwise occupied. Kayla Frey was being attended to by a nurse and family was present.    Chaplain will attempt follow-up visit within one week if possible, refer to regular unit chaplain, and remain available for visits as requested for continuing spiritual care.    Spiritual Care Provider:   Marcelino Scot, Mdiv  Chaplain Resident at [facility]  Spiritual Care Services  [place]     ______________________________________________________________________  Spiritual care is available 24/7. To reach the chaplain on call at any time:   [place] Chaplain Berniece Pap or call [phone]  [facility] [place] or call [phone]  [facility] [place] or call [phone]

## 2023-05-15 NOTE — Progress Notes (Addendum)
OBSTETRICS POSTPARTUM PROGRESS NOTE     Events  No events.    Subjective  Pain: Yes location crampy controlled  Bleeding: lochia Minimal  PO's: taking regular diet  Voiding: without difficulty  Ambulating: yes  Feeding: formula feeding    Vitals  Temp:  [36.6 C-37.3 C] 36.9 C  Heart Rate:  [80-106] 90  *Resp:  [16-20] 18  BP: (107-169)/(50-98) 125/50  SpO2:  [91 %-100 %] 99 %    Physical Exam  Gen: well appearing  Chest/Breasts: nipples intact, breasts soft, not wearing tight bra, discussed.   Fundus: firm, below umbilicus  Perineum: deferred and intact   Ext: no edema    Data  Labs Reviewed and Significant for:  Hep B        10/29/22  1134   HBSAG NEG  NEG   HBCAB NEG   HCV NEG     RPR/TREP  Results for orders placed or performed during the hospital encounter of 05/12/23   Syphilis Screen by RPR with Reflex to Treponemal Antibody    Specimen: Serum; Blood   Result Value Ref Range    RPR Nonreactive Nonreactive   Results for orders placed or performed in visit on 03/05/23   Syphilis Screen by RPR with Reflex to Treponemal Antibody    Specimen: Serum; Blood   Result Value Ref Range    RPR Nonreactive Nonreactive   Results for orders placed or performed in visit on 12/17/22   Syphilis Screen by RPR with Reflex to Treponemal Antibody    Specimen: Serum; Blood   Result Value Ref Range    RPR Nonreactive Nonreactive   Results for orders placed or performed in visit on 10/29/22   Syphilis Screen by RPR with Reflex to Treponemal Antibody    Specimen: Serum; Blood   Result Value Ref Range    RPR Nonreactive Nonreactive     CBC        05/15/23  0520   WBC 20.2*   HGB 10.0*   HCT 29.0*   PLT 247       Neonate Status  C3907/C3907-O01     Problem-based Assessment and Plan    Kayla Frey is a 37 y.o. G1P1001 PPD# 1 s/p Vertex, spontaneous  vaginal delivery.    s/p NSVDi  Assessment & Plan  Post care: meeting some goals, desires lacation suppression medication today.   Hemodynamics: continues to have MRBP's despite on bp  meds.   05/15/23: current regiment started 05/14/23 at 2000  Will CTM and consider another agent for blood pressure management.   [  ] repeat labs at 1700 05/15/23  Pain: well controlled  MoF: breast  MoC:  condoms  PNL:   Lab Results   Component Value Date    ABO/RH(D) O POS 05/15/2023    Rubella Antibody 5.60 10/29/2022      Vaccinations:   Immunization History   Administered Date(s) Administered    Influenza 07/26/2021    Pfizer Bivalent Covid (12+) 07/24/2021    Pfizer Sars-Cov-2 Vaccine (Purple Top) 06/23/2020, 08/02/2020    RSV, Bivalent PF 05/01/2023    Tdap 11/27/2017, 03/05/2023     Dispo: likely home on PPD# 2, baby in NICU on cooling  Req 1 week bp check 05/14/23 has 6 week PP visit scheduled  [  x] meds sent to pharm, desires flu vaccine prior to discharge  Dostinex 1 mg ordered today per patient request for lactation suppression.     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 On Valtrex since 36w. No prodrome, no lesions on admission SSE.    cHTN  Assessment & Plan  Since 2022, BP of 130-150/70-90. MRBPs at initiation of PN care 09/2022, then 12/2022 elevation to 160/68 in clinic, started on nifed 30mg  XL daily. At goal BP since. Pre-E labs normal.     Hx lsc sleeve gastrectomy  Assessment & Plan  In 2021 w/ subsequent 50lb weight loss    BMI 57  Assessment & Plan  [ ]  awareness    * IOL cHTN  Assessment & Plan  Labor:  05/12/23   2100 admitted   2315 c/l/h, 60cc FB placed  05/13/23   0342 miso x3    0920 3/2/-2 s/p miso x 5. FB out. Pitocin tarted   1640 4/1/-1, AROM to clears, FSE, IUPC placed  05/14/23   0154 4/1/-2   1610 5/1/-1   0910 5/1/-2, 3 min decel (resolved w position changes and pit off)   1400 8/<0.5/-1   1500 9/<0.5/-1    Fetal Well Being:   Cat II intermittent variables baseline 130s moderate variability       Pain Control:  Epidural    Mode of  Delivery:  Expect NSVD           [name], CNM  05/15/2023

## 2023-05-16 LAB — COMPLETE BLOOD COUNT
Hematocrit: 30.4 % — ABNORMAL LOW (ref 36.0–46.0)
Hemoglobin: 10.4 g/dL — ABNORMAL LOW (ref 12.0–15.5)
MCH: 31.3 pg (ref 26.0–34.0)
MCHC: 34.2 g/dL (ref 31.0–36.0)
MCV: 92 fL (ref 80–100)
MPV: 10.9 fL (ref 9.1–12.6)
Platelet Count: 280 10*9/L (ref 140–450)
RBC Count: 3.32 10*12/L — ABNORMAL LOW (ref 4.00–5.20)
RDW-CV: 13.6 % (ref 11.7–14.4)
WBC Count: 17.9 10*9/L — ABNORMAL HIGH (ref 3.4–10.0)

## 2023-05-16 LAB — ALANINE TRANSAMINASE: Alanine transaminase: 11 U/L (ref 10–61)

## 2023-05-16 LAB — ASPARTATE TRANSAMINASE: AST: 26 U/L (ref 5–44)

## 2023-05-16 LAB — CREATININE, SERUM / PLASMA
Creatinine: 0.87 mg/dL (ref 0.55–1.02)
eGFRcr: 88 mL/min/{1.73_m2} (ref >59)

## 2023-05-16 MED ORDER — FLU VACCINE TS 2024-25(6MOS UP)(PF) 45 MCG(15MCG X3)/0.5 ML IM SYRINGE
45 | Freq: Once | INTRAMUSCULAR | Status: DC
Start: 2023-05-16 — End: 2023-05-16

## 2023-05-16 MED ORDER — ESCITALOPRAM 10 MG TABLET
10 | Freq: Every day | ORAL | Status: DC
Start: 2023-05-16 — End: 2023-05-16
  Administered 2023-05-16: 16:00:00 30 mg via ORAL

## 2023-05-16 MED FILL — NIFEDIPINE ER 60 MG TABLET,EXTENDED RELEASE 24 HR: 60 60 mg | ORAL | Qty: 1

## 2023-05-16 MED FILL — OXYCODONE 5 MG TABLET: 5 5 mg | ORAL | Qty: 1

## 2023-05-16 MED FILL — ESCITALOPRAM 10 MG TABLET: 10 mg | ORAL | Qty: 3

## 2023-05-16 MED FILL — NIFEDIPINE ER 60 MG TABLET,EXTENDED RELEASE 24 HR: 60 mg | ORAL | Qty: 1

## 2023-05-16 MED FILL — ACETAMINOPHEN 500 MG TABLET: 500 mg | ORAL | Qty: 2

## 2023-05-16 MED FILL — FLULAVAL TRIV 2024-2025 (PF) 45 MCG (15 MCG X 3)/0.5 ML IM SYRINGE: 45 45 mcg (15 mcg x 3)/0.5 mL | INTRAMUSCULAR | Qty: 0.5

## 2023-05-16 MED FILL — TRINATAL RX 1 60 MG IRON-1 MG TABLET: 60 mg iron-1 mg | ORAL | Qty: 1

## 2023-05-16 MED FILL — DOCUSATE SODIUM 250 MG CAPSULE: 250 mg | ORAL | Qty: 1

## 2023-05-16 NOTE — Telephone Encounter (Signed)
Pt was discharged home, baby still on cooling in NICU.  Called patient to debrief the birth because the L&D acuity did not allow the team to be able to do it while she was still in patient.  Pt was grateful for the call and asked a number of questions.  The first was why the team did not inform her that the baby had an umbilical nuchal cord and if we should have recommended for her to have a C/S due to the cord.  I explained to the patient that she guessed there may be a cord issue during her labor and that is why we gave her an Amnio infusion into her IUPC, however we did not know definitively there would be a nuchal cord nor is that an indication for a C/S.  I also explained that when the FHTs started to worsen she was very close to delivery and that doing a C/S during that time would have taken longer than it took for her to actually push her baby out.  Pt seemed understanding of the thought process behind this decision.    Pt also verbalized that she was not prepared to see her baby with so many tubes and interventions when she came to the NICU to see her.  When the baby was taken from the L&D she had a face mask and pt was not expecting the baby to look so sick when she saw her next.  I offered her support around her experience and how hard that must have been for her to see her new born baby in this state.  Pt reports feeling grateful for her care and would like to continue to debrief with other team members so that she can fully process this difficult experience.  I will pass on to the next shift that she will be back at [place] to see her baby on Sunday and Monday and would be open to steak to more members of the delivery team.

## 2023-05-16 NOTE — Plan of Care (Signed)
Problem: Discharge Planning - OB  Goal: Knowledge of and participation in plan of care  Outcome: Adequate for Discharge  Goal: Coordination of safe discharge plan  Outcome: Adequate for Discharge     Problem: Adjustment to Parenting - Postpartum Patient - OB  Goal: Able to perform infant care tasks  Outcome: Adequate for Discharge  Goal: Parent-infant bonding initiation  Outcome: Adequate for Discharge     Problem: Breast Milk Expression / Pumping - Postpartum Patient - OB  Goal: Effective pumping of breastmilk  Outcome: Adequate for Discharge  Goal: Effective hand expression of breastmilk  Outcome: Adequate for Discharge

## 2023-05-18 NOTE — Progress Notes (Signed)
Office of Population Health Peripartum Remote Blood Pressure Management Program     Blood pressure readings in the patient flowsheet are reviewed twice a week to identify any values that did not trigger an inbasket notification, but may warrant further follow up.    Send a high priority staff message to P OB HIGH RISK CLINICAL for OB RN to call patient if:   []  Patient has not had a previous diagnosis of hypertension (chronic hypertension, gestational hypertension, preeclampsia, or superimposed preeclampsia) AND has even one mildly elevated BP: Single SBP of >=140 or DBP >=90. OB RN to follow OB clinic's current clinical protocol w/ in 72 hours.       Schedule an appointment w/ High Risk NP Lequita Asal) or with Dr. Eugenie Birks for [facility] or [facility] patients unless patient is seeing an MFM or High Risk NP (ie: scheduled in the COM DEP) or MD within 1 week if any of the following occur:  *unless patient has upcoming appt with a Maternal Fetal Medicine provider (will show as scheduled in the COM DEP)    []  >50% of recorded BPs are SBP >=135 or DBP >=85 (for patients with [facility] or diagnosis of HDP not on meds): appt w/in 1 week, to consider starting medications   []  Two occasions of SBP of >=150 or DBP >=100 (for patients with CHTN or diagnosis of HDP): appt w/in 1 week, to consider starting/adjusting medications   []  Any triage visit or admission related to HTN (whether CHTN or HDP): appt w/in 1 week (as a follow-up visit)   []  Monthly for patients on antihypertensive medications   []  Any patient who reached out with questions that the [facility] team cant answer   []  Postpartum at 1-2 weeks   []  Postpartum at 5-6 weeks, prior to discontinuation of program       No Further Action Needed For Now   []  No concerning BP readings upon review.   [x]  No BPs since last chart review. If no reading for >7 days, patient will receive automatic reminder via MyChart.   []  No BPs since last chart review. If no BP reading  for >14 days, health care navigator will follow up with patient.     [x]  Other: RN Notes: Just discharged 05/16/23.       Vassie Moselle, RN  [place] [facility] [employer]  [phone]

## 2023-05-18 NOTE — Progress Notes (Signed)
Population Health flowsheet documentation only

## 2023-05-20 LAB — SURGICAL PATHOLOGY
Clinical Information: 36
Pathologist: 97595
Specimen Class: 51
Subspecialty: 22

## 2023-05-21 MED ORDER — NIFEDIPINE ER 30 MG TABLET,EXTENDED RELEASE 24 HR
30 | Freq: Once | ORAL | Status: AC
Start: 2023-05-21 — End: 2023-05-21
  Administered 2023-05-22: 04:00:00 60 mg via ORAL

## 2023-05-21 MED ORDER — FUROSEMIDE 20 MG TABLET
20 | Freq: Once | ORAL | Status: AC
Start: 2023-05-21 — End: 2023-05-21
  Administered 2023-05-22: 04:00:00 20 mg via ORAL

## 2023-05-21 MED ORDER — FUROSEMIDE 20 MG TABLET
20 | ORAL_TABLET | Freq: Every day | ORAL | 0 refills | Status: DC
Start: 2023-05-21 — End: 2023-05-21

## 2023-05-21 NOTE — Progress Notes (Addendum)
ED Attendings     Dr. Magda Bernheim  History   Kayla Frey is a 37 y.o. G1P1001 [redacted]w[redacted]d presents with elevated blood pressures in setting of cHTN.    No chief complaint on file.    HPI  Patient with IOL for CHTN at [redacted]w[redacted]d, now PPD7. Had BP check call today and had elevated BP 150's over 90's, was advised to go to triage.    Has hx of elevated BP since 2022 not on meds. During this pregnancy had elevated Bps to 160/60's and was started on nifedipine 30mg  BID. Had normal blood pressure while on med. When admitted for IOL had MR Bps on 9/19 and was recommended to try nifedipine 60mg  BID. Discharged on this regimen and has been taking nifedipine since.     She did not check BP first two days after being discharged. Started checking BP Tuesday 9/24, checks them in the morning before nifed, she does not have log of numbers. After she left the hospital she started having swelling and bought an OTC diuretic medication which she has been taking once a day and upon further questioning it was determined to be Diurex which contains caffeine.     Denies PIH symptoms.     BP log:  9/24 Does not remember     9/25  SBP 140's    9/26   155/90 before nifedipine  144/91  151/91    Denies PreE symptoms     Anxiety/depression plan to start lexapro and propanolol PP    In regards to her pospartum recovery,she is having mild bleeding, and some cramping feels but feels well overall. Saw baby in ICN before coming to triage.     PMH: HTN. Anxiety, depression  PSxH: hx lsc sleeve gastrectomy 2021. No other surgeries  Meds: lexapro, pronalolol, PNV, vitamin supplements   Allergies: food allergies, shellfish, and adhesive tape     Allergies/Contraindications   Allergen Reactions    Adhesive Rash     Gets chemical burn like reaction, redness. Cannot use "plastic" type adhesive. Ok for 'cotton' bandages.     Iodine Hives and Itching     IV Iodine.  Unsure about topical iodine    Shellfish Derived Anaphylaxis    Tree Nuts Anaphylaxis    Morphine  Nausea And Vomiting     Past Medical History:   Diagnosis Date    Abnormal Pap smear of cervix     s/p LEEP    Acne 11/23/2019    New to me. Mild facial acne. Previously on topical retinoid but no longer taking it.       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    Vitamin D deficiency      Past Surgical History:   Procedure Laterality Date    BARIATRIC SURGERY  LEEP       Family History   Problem Relation Name Age of Onset    Mental health problem Mother Mom         unknown type    Drug abuse Mother Mom         crack cocaine    Ovarian cysts Mother Mom     Cancer - colon Father Dad 9        d.58, liver failure, worked on planes for the [employer] blood pressure Father Dad     Drug abuse Father Dad         crack cocaine    Colon cancer Father Dad     Heart disease Father Dad     Depression Brother Brother     Anxiety disorder Brother Brother     Mental health problem Brother Brother         possible bipolar disorder    Alcoholism Brother Brother     Asthma Brother Brother     Suicidality Brother Brother         attempted suicide    Early death Paternal Aunt Annabelle Harman     Obesity Paternal Caryn Bee     Colon cancer Paternal Uncle archie 9    Cancer - lung Maternal Grandmother Jo ann         in 50s-60s, had pneumonia    Alcoholism Maternal Grandmother Jo ann     Cancer Maternal Grandmother Alvino Chapel ann     Mental health problem Maternal Grandmother Alvino Chapel ann         unknown type     Cervical cancer Maternal Grandmother Marvia Pickles         unknown age    Colon cancer Paternal Grandfather Jari Favre 21        Dealer    Miscarriages / [place] Cousin      Eclampsia Cousin      Cervical cancer Cousin          from HPV    Cervical cancer Maternal Great-Grandmother      Anesth problems Neg Hx       Social History     Socioeconomic History    Marital status: Single   Tobacco Use    Smoking status: Former     Current packs/day: 0.00     Types: Cigarettes     Start date: 09/14/2014     Quit date: 12/30/2019     Years since quitting: 3.3    Smokeless tobacco: Never    Tobacco comments:     taking chantix to help with smoking cessation   Substance and Sexual Activity    Alcohol use: Not Currently     Comment: has used socially in past; no history of problem use    Drug use: Never     Comment: no history of Rx med abuse or illicit substance use    Sexual activity: Yes     Partners: Male     Birth control/protection: None     Comment: Pt denies pregnancy.  No activity for at least 1 mont     Social Determinants of Health     Financial Resource Strain: Not on File (12/21/2022)    Received from [employer], [employer]     Financial Resource Strain: 0   Transportation Needs: Not on File (12/21/2022)    Received from [employer], [employer] Needs     Transportation:  0   Housing Stability: Not on File (12/21/2022)    Received from [place], [employer] Stability     Housing: 0       Review of Systems   Review of Systems      Physical Exam   Triage Vital Signs:  BP: 129/78, Heart Rate: 66, Temp: 36.8 C, *Resp: 18, SpO2: 99 %    Physical Exam  Constitutional:       Appearance: Normal appearance.   Pulmonary:      Effort: Pulmonary effort is normal.   Skin:     General: Skin is warm.      Comments: Mild swelling to middle calves bilateral, no calf tenderness, no erythema    Neurological:      General: No focal deficit present.   Psychiatric:         Mood and Affect: Mood normal.        Data      CBC        05/21/23  2030   WBC 10.0   HGB 10.7*   HCT 30.6*   PLT 393       Chem7        05/21/23  2030   NA 139   K 3.9   CL 106   CO2 26   BUN 18   CREAT 1.00   GLU 77       Liver Panel        05/21/23  2030   AST 15   ALT 12   ALKP 123*   TBILI 0.4   TP 7.0   ALB 3.1*         Assessment and Plan/Final Disposition   Brittnye Josephs is a 37 y.o. G1P1001 PPD7 from NSVD with MRBP for the past two days. Had normal BP in triage (136/76). Was offered dose of furosemide 20 mg PO x 1 for her swelling which she took.  PreE labs were normal. Cr slightly elevated above baseline (1.0), so will defer 5 day course of furosemide to not incite AKI; in addition, swelling on exam was unimpressive. At this time, no need to change BP medications given we identified inciting factor (OTC diuretic which contains caffeine). Reviewed return precautions.     Plan:  [ ]  cont home BP monitoring BID; reviewed parameters for which to call vs come in  [ ]  Continue Nifed 60mg  BID  [ ]  Plan for telephone appointmnet in 3 days   [ ]  d/c OTC diurex which contains caffeine    Discussed and seen with Dr. Tylene Fantasia, MD  05/21/23

## 2023-05-21 NOTE — Discharge Instructions (Signed)
HIGH BLOOD PRESSURE IN PREGNANCY AND FOLLOW-UP    You received a diagnosis of high blood pressure in pregnancy. This can include a range of disorders:  Chronic hypertension:  high blood pressure that was present before you got pregnant  Gestational hypertension:  high blood pressure that started in the second half of pregnancy, typically late in the 3rd trimester  Pre-eclampsia: High blood pressure plus protein in your urine. If pre-eclampsia is severe, women can have symptoms like headache or vision changes, abnormal kidney, lung, or liver function and other complications. When a woman with chronic hypertension is affected, it is called superimposed pre-eclampsia. When a woman with high blood pressures in pregnancy has a seizure, it is called eclampsia.    While high blood pressure due to pregnancy usually resolves once the pregnancy ends, it might not go away for the first 6 weeks after delivery. We want to follow you closely after delivery, as it is possible for the elevated blood pressure could return or even worsen after delivery.     FOLLOWING YOUR BLOOD PRESSURE AFTER DELIVERY  We want to follow your blood pressures post delivery in case they worsen. Return to [facility] clinic for blood pressure check 7-10 days after your delivery. Please call the clinic where you had your prenatal care for a blood pressure check if you are not contacted by the clinic for this appointment.      While checking your blood pressure at home, please call [phone] if your blood pressures are 150 or higher on the top or 100 or lower on the bottom. Seek immediate emergency attention if your blood pressure is above 160/110.    DANGER SIGNS  Call your clinic, or [phone] if the clinic is closed, if you have any of the following:  Headache that doesn't resolve with pain medicine, eating, drinking fluids or rest.   Visual changes including blurry vision, tunnel vision, loss of vision, (especially one sided) or seeing floaters/squiggly  lines.  Abdominal pain located in the upper part of your abdomen.     EMERGENCY SYMPTOMS  SEEK IMMEDIATE ATTENTION IF YOU HAVE ANY OF THE FOLLOWING:  Localized weakness of one part of your body  Trouble speaking or understanding  Difficulty breathing  Seizures  Loss of consciousness  YOUR SIX-WEEK CHECK-UP  Your 6-week check-up is important for your general health, to check on your recovery, and to be sure that your blood pressure is normal.  If you had pre-eclampsia and are interested in getting pregnant again, talk to your OB/GYN about ways to reduce the risk of getting pre-eclampsia with the next pregnancy.    LONG-TERM FOLLOW UP  Hypertensive disorders in pregnancy may increase your risk for heart disease in the future, so it is crucial to follow-up with your doctor regularly. Pre-eclampsia increases your risk for developing:   High blood pressure   Heart attacks  Stroke   Blood clots (that may travel to your lungs)  Kidney disease     Regular check-ups with your primary care doctor may decrease your risk. Together you can discuss prevention strategies such as lifestyle changes (eating a balanced healthy diet, exercising regularly, limiting alcohol, avoiding smoking) or medications if needed to reduce your risk of heart disease and promote a long and healthy life!     If you do not already have a PCP, it is ideal that you get one to follow your health. To schedule your first appointment, call:  [facility]: [phone]  [facility]  [facility]: [phone]    What can you do to reduce your risk for high blood pressure?  Stay physically active. Exercise 150 minutes each week.  Maintain a healthy weight.  Eat a healthy diet high in vegetables and fruits and limit your salt.  Have your blood pressure checked regularly and manage high blood pressure.  Take blood pressure medication exactly as directed.           MONITORING YOUR BLOOD PRESSURE AT HOME    Please check  and write down your blood pressure each day at around the same time.  Example:  110    70    Before you check your blood pressure, please make sure you:  Do not have any caffeine (coffee, tea, soda, energy drinks, etc.) or tobacco for 30 minutes before checking  Sit down and rest for at least 5 minutes before checking  Have your arm with the blood pressure cuff at the level of your heart, be sitting with your feet on the floor, your legs uncrossed, and your back supported    Date/Time            Blood Pressure              Date/Time            Blood Pressure              Date/Time            Blood Pressure              Date/Time            Blood Pressure                Reminder: If you have a HEADACHE that does not go away with acetaminophen (Tylenol), SPOTS OR CHANGES IN YOUR VISION, or RIGHT UPPER BELLY PAIN, please call the OB Clinic at [phone] during the day or the [facility] triage at [phone] during holidays, nights and weekends.    Current BP   Medications:              What do I do with my blood pressure readings?      If your blood pressure reading is:            Your blood pressure is well controlled!        If your blood pressure reading is:             Your blood pressure needs some attention soon!     Please call the OB Clinic at [phone] during the day or the [facility] Triage at [phone] during holidays, nights and weekends to let us know and get help!      If your blood pressure reading is:                               You may have a dangerous situation with your blood pressure!    Please call the OB Clinic at [phone] during the day or the [facility] Triage at [phone] during holidays, nights and weekends. You will likely need to come in to the Birth Center to be seen.

## 2023-05-21 NOTE — Progress Notes (Signed)
Office of Population Health Peripartum Remote Blood Pressure Management Program     Blood pressure readings in the patient flowsheet are reviewed twice a week to identify any values that did not trigger an inbasket notification, but may warrant further follow up.    Send a high priority staff message to P OB HIGH RISK CLINICAL for OB RN to call patient if:   []  Patient has not had a previous diagnosis of hypertension (chronic hypertension, gestational hypertension, preeclampsia, or superimposed preeclampsia) AND has even one mildly elevated BP: Single SBP of >=140 or DBP >=90. OB RN to follow OB clinic's current clinical protocol w/ in 72 hours.       Schedule an appointment w/ High Risk NP Lequita Asal) or with Dr. Eugenie Birks for [facility] or [facility] patients unless patient is seeing an MFM or High Risk NP (ie: scheduled in the COM DEP) or MD within 1 week if any of the following occur:  *unless patient has upcoming appt with a Maternal Fetal Medicine provider (will show as scheduled in the COM DEP)    []  >50% of recorded BPs are SBP >=135 or DBP >=85 (for patients with [facility] or diagnosis of HDP not on meds): appt w/in 1 week, to consider starting medications   []  Two occasions of SBP of >=150 or DBP >=100 (for patients with CHTN or diagnosis of HDP): appt w/in 1 week, to consider starting/adjusting medications   []  Any triage visit or admission related to HTN (whether CHTN or HDP): appt w/in 1 week (as a follow-up visit)   []  Monthly for patients on antihypertensive medications   []  Any patient who reached out with questions that the [facility] team cant answer   []  Postpartum at 1-2 weeks   []  Postpartum at 5-6 weeks, prior to discontinuation of program       No Further Action Needed For Now   []  No concerning BP readings upon review.   []  No BPs since last chart review. If no reading for >7 days, patient will receive automatic reminder via MyChart.   [x]  No BPs since last chart review. If no BP reading  for >14 days, health care navigator will follow up with patient.     [x]  Other: RN Notes: Has OB appt today.       Vassie Moselle, RN  [place] [facility] of [employer]  [phone]

## 2023-05-21 NOTE — Telephone Encounter (Signed)
ID check x 2.   Phone bp ck 1 wk pp.  Admission Diagnosis: IOL for cHTN  Discharge Diagnosis: Encounter for induction of labor @ [redacted]w[redacted]d GA   Denies any HA's, visual disturbances or epigastric pain.    On an antihypertensive med  Nifedipine XL 60 mg BID   @ hm bp readings 151/94, 144/91, 151/91    Readings reported to NPOTD:   Instructions given:   Pt to be sent to L&D Triage for further evaluation.  Pt gave a verbal understanding, will head to triage after visiting baby in ICU.    Durene Cal LVN

## 2023-05-22 LAB — COMPREHENSIVE METABOLIC PANEL (BMP, AST, ALT, T.BILI, ALKP, TP ALB)
AST: 15 U/L (ref 5–44)
Alanine transaminase: 12 U/L (ref 10–61)
Albumin, Serum / Plasma: 3.1 g/dL — ABNORMAL LOW (ref 3.5–5.0)
Alkaline Phosphatase: 123 U/L — ABNORMAL HIGH (ref 38–108)
Anion Gap: 7 (ref 4–14)
Bilirubin, Total: 0.4 mg/dL (ref 0.2–1.2)
Calcium, total, Serum / Plasma: 9.2 mg/dL (ref 8.4–10.5)
Carbon Dioxide, Total: 26 mmol/L (ref 22–29)
Chloride, Serum / Plasma: 106 mmol/L (ref 101–110)
Creatinine: 1 mg/dL (ref 0.55–1.02)
Glucose, non-fasting: 77 mg/dL (ref 70–199)
Potassium, Serum / Plasma: 3.9 mmol/L (ref 3.5–5.0)
Protein, Total, Serum / Plasma: 7 g/dL (ref 6.3–8.6)
Sodium, Serum / Plasma: 139 mmol/L (ref 135–145)
Urea Nitrogen, serum/plasma: 18 mg/dL (ref 7–25)
eGFRcr: 75 mL/min/{1.73_m2} (ref >59)

## 2023-05-22 LAB — COMPLETE BLOOD COUNT
Hematocrit: 30.6 % — ABNORMAL LOW (ref 36.0–46.0)
Hemoglobin: 10.7 g/dL — ABNORMAL LOW (ref 12.0–15.5)
MCH: 31.6 pg (ref 26.0–34.0)
MCHC: 35 g/dL (ref 31.0–36.0)
MCV: 90 fL (ref 80–100)
MPV: 9.6 fL (ref 9.1–12.6)
Platelet Count: 393 10*9/L (ref 140–450)
RBC Count: 3.39 10*12/L — ABNORMAL LOW (ref 4.00–5.20)
RDW-CV: 12.5 % (ref 11.7–14.4)
WBC Count: 10 10*9/L (ref 3.4–10.0)

## 2023-05-22 MED FILL — FUROSEMIDE 20 MG TABLET: 20 mg | ORAL | Qty: 1

## 2023-05-22 MED FILL — NIFEDIPINE ER 30 MG TABLET,EXTENDED RELEASE 24 HR: 30 30 mg | ORAL | Qty: 2

## 2023-05-22 NOTE — Telephone Encounter (Signed)
This encounter was completed by a Health Care Navigator from the Office of [employer] (located in [place]) supporting all primary care with health maintenance workflows.    [address] [facility] HCN Outreach Documentation    Primary HCN: Caprice Renshaw  Primary Program: Pharmacy Hypertension  All Programs Addressed: Pharmacy Hypertension  Pharmacy Program Status: Chart Review     Encounter opened to review patient's eligibility for possible enrollment in Hypertension Pharmacy Program. Routed chart to [facility] HCN to confirm eligibility.     Caprice Renshaw  Health Care Navigator  Office of [facility] Outreach Team  [facility]    Future Appointments   Date Time Provider Department Center   05/28/2023  3:00 PM Debroah Baller, MD [facility] All Practice   06/26/2023  2:30 PM Arianna Burman Nieves, MD [facility] All Practice

## 2023-05-25 NOTE — Telephone Encounter (Signed)
Synopsis SmartLink 05/23/2023    02:16 05/23/2023    02:14 05/12/2023    21:09 05/01/2023    14:54 02/23/2023    10:31 02/23/2023    10:30   PHQ-9/GAD-7 Clinician/Patient Input   PHQ-9 Score Clinican/Patient Entered 2  1 10 12     GAD-7 Score Clinican/Patient Entered  5 0 9  13                11/23/2019     1:34 PM   YBOCS Date   YBOCS Score 24            Durene Cal LVN

## 2023-05-25 NOTE — Progress Notes (Signed)
Office of Population Health Peripartum Remote Blood Pressure Management Program     Blood pressure readings in the patient flowsheet are reviewed twice a week to identify any values that did not trigger an inbasket notification, but may warrant further follow up.    Send a high priority staff message to P OB HIGH RISK CLINICAL for OB RN to call patient if:   []  Patient has not had a previous diagnosis of hypertension (chronic hypertension, gestational hypertension, preeclampsia, or superimposed preeclampsia) AND has even one mildly elevated BP: Single SBP of >=140 or DBP >=90. OB RN to follow OB clinic's current clinical protocol w/ in 72 hours.       Schedule an appointment w/ High Risk NP Lequita Asal) or with Dr. Eugenie Birks for [facility] or [facility] patients unless patient is seeing an MFM or High Risk NP (ie: scheduled in the COM DEP) or MD within 1 week if any of the following occur:  *unless patient has upcoming appt with a Maternal Fetal Medicine provider (will show as scheduled in the COM DEP)    []  >50% of recorded BPs are SBP >=135 or DBP >=85 (for patients with [facility] or diagnosis of HDP not on meds): appt w/in 1 week, to consider starting medications   []  Two occasions of SBP of >=150 or DBP >=100 (for patients with CHTN or diagnosis of HDP): appt w/in 1 week, to consider starting/adjusting medications   []  Any triage visit or admission related to HTN (whether CHTN or HDP): appt w/in 1 week (as a follow-up visit)   []  Monthly for patients on antihypertensive medications   []  Any patient who reached out with questions that the [facility] team cant answer   []  Postpartum at 1-2 weeks   []  Postpartum at 5-6 weeks, prior to discontinuation of program       No Further Action Needed For Now   []  No concerning BP readings upon review.   []  No BPs since last chart review. If no reading for >7 days, patient will receive automatic reminder via MyChart.   [x]  No BPs since last chart review. If no BP reading  for >14 days, health care navigator will follow up with patient.     [x]  Other: RN Notes: Discharged 05/16/23       Vassie Moselle, RN  [place] [facility] [employer]  [phone]

## 2023-05-25 NOTE — Progress Notes (Signed)
Office of Population Health  Utilization and Disease Management Programs  Discharge Follow-Up Note       Reviewed patient's chart as part of the Utilization and Disease Management Programs follow-up.    Patient recently discharged from the hospital and was scheduled for a follow-up call. Patient did not receive the [employer] automated discharge follow-up call. First attempt to call patient. Not able to leave voicemail. Will try to reach patient again tomorrow.    [facility] Transitional Care Services (TCS) patients only: unsuccessful outreach attempt    Vassie Moselle, RN  Utilization and Disease Management Teams  [place] Office of Population Health  [phone]    This encounter is part of a longitudinal series of calls done after hospital discharge to ensure a safe transition home. These calls address issues including discharge instructions, symptom management, medications, care coordination, and follow-up appointments.    For more information on OPH programs, please see  [email or web address]   For general questions (no PHI), please contact [email or web address]

## 2023-05-26 NOTE — Telephone Encounter (Signed)
Add on BP check this morning.     TC to pt but she hasn't recorded any BPs since she was discharged home as she has been in and out of NICU with baby. Pt states baby is being discharged home now so we will reschedule phone call till tomorrow 3pm. Pt to records BP when she gets home and tomorrow morning to report for call.     Pt denies any headaches, changes in her vision or high epigastric pain. Pt currently taking Nifedipine 60mg  BID.     Pt agrees to plan to check BP and will follow up tomorrow with nurse BP check.

## 2023-05-26 NOTE — Progress Notes (Addendum)
Patient was referred to the Enhanced Care Management (ECM) program on 06/05/23.   Referral was accepted and patient is authorized for [facility] 05/26/23 through 05/24/24.   Updated the Population Health ECM flowsheet to outreach status.     Colletta Maryland  Program Coordinator  Office of [employer]  [phone]

## 2023-05-26 NOTE — Progress Notes (Signed)
Updating ECM flowsheet.     Kayla Frey  Acupuncturist of [employer]  [phone]

## 2023-05-27 NOTE — Telephone Encounter (Signed)
ID check x2.    Phone B/P check. Patient is days 13 pp. Delivery date: 05/14/2023   Admission Diagnosis: IOL for cHTN  Discharge Diagnosis: Encounter for induction of labor     At home BP check:   10/2: 127/72  10/1: 122/63     Currently taking: Nifedipine 60 mg BID     Asymptomatic, patient denies visual changes, RUQ epigastric pain, headaches, dizziness or feeling of lightheadedness.       Readings reported to Oretha Caprice, NP  -Instructions given per B/P protocol:  -Patient to continue B/P regimen.  -Continue checking B/P daily and keep log.   -Patient instructed to call clinic or triage if BP readings >140/90, or <110/60 on two occasions or if experiencing symptoms mentioned above.   - Pt to review B/P log and medication on follow up PP VV scheduled 05/28/23 with Dr. Jasmine December.     Patient verbalized understanding.    Wynn Maudlin, [place]

## 2023-05-28 ENCOUNTER — Telehealth
Admit: 2023-05-29 | Payer: MEDICAID | Attending: Physician | Primary: Student in an Organized Health Care Education/Training Program

## 2023-05-28 NOTE — Patient Instructions (Addendum)
So nice seeing you! Let me know if you want to do your Pap smear at your next appointment.

## 2023-05-28 NOTE — Progress Notes (Signed)
Office of Population Health Peripartum Remote Blood Pressure Management Program     Blood pressure readings in the patient flowsheet are reviewed twice a week to identify any values that did not trigger an inbasket notification, but may warrant further follow up.      Send a high priority staff message to P OB HIGH RISK CLINICAL for OB RN to call patient if:   []  Patient has not had a previous diagnosis of hypertension (chronic hypertension, gestational hypertension, preeclampsia, or superimposed preeclampsia) AND has even one mildly elevated BP: Single SBP of >=140 or DBP >=90. OB RN to follow OB clinic's current clinical protocol w/ in 72 hours.       Schedule an appointment w/ High Risk NP Lequita Asal) or with Dr. Eugenie Birks for [facility] or [facility] patients unless patient is seeing an MFM or High Risk NP (ie: scheduled in the COM DEP) or MD within 1 week if any of the following occur:  *unless patient has upcoming appt with a Maternal Fetal Medicine provider (will show as scheduled in the COM DEP)    []  >50% of recorded BPs are SBP >=135 or DBP >=85 (for patients with [facility] or diagnosis of HDP not on meds): appt w/in 1 week, to consider starting medications   []  Two occasions of SBP of >=150 or DBP >=100 (for patients with CHTN or diagnosis of HDP): appt w/in 1 week, to consider starting/adjusting medications   []  Any triage visit or admission related to HTN (whether CHTN or HDP): appt w/in 1 week (as a follow-up visit)   []  Monthly for patients on antihypertensive medications   []  Any patient who reached out with questions that the [facility] team cant answer   []  Postpartum at 1-2 weeks   []  Postpartum at 5-6 weeks, prior to discontinuation of program       No Further Action Needed For Now   [x]  No concerning BP readings upon review.   []  No BPs since last chart review. If no reading for >7 days, patient will receive automatic reminder via MyChart.   []  No BPs since last chart review. If no BP reading  for >14 days, health care navigator will follow up with patient.     []  Other: RN Notes:        Vassie Moselle, RN  [place] [facility] of [employer]  [phone]

## 2023-05-28 NOTE — Progress Notes (Signed)
MFM Postpartum Visit    I performed this consultation using real-time Telehealth tools, including a live video connection between my location and the patient's location. Prior to initiating the consultation, I obtained informed verbal consent to perform this consultation using Telehealth tools and answered all the questions about the Telehealth interaction.    Interval  History:     Kayla Frey is a 37 y.o. old G1P1001 who is 2 weeks PP from NSVD after induction of labor at 38 weeks for chronic hypertension. Her postpartum course was unremarkable and she was discharged on postpartum day 2.    Her daughter, Kayla Frey, was born weighing 2925g, with Apgars of 2/4/5. She was hypotonic at birth with poor respiratory drive, so underwent therapeutic cooling. 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 She was discharged in good condition on 10/2.    Kayla Frey was followed by MFM for chronic HTN, PTSD, BMI 57, history of sleeve gastrectomy, CINII.    Earlene states she's doing well today. It was hard to have Kayla Frey in the ICN and she had questions about her delivery, but all were answered and she's feeling good to have Kayla Frey home. Restarted lexapro 30 mg and propranolol as needed for anxiety. Mood is good, feeling well.    Taking nifedipine 60mg  BID (meds were uptitrated postpartum). BPs are normalizing.     Feet are less swollen now.    Having scant bleeding now.     Today, she has no acute complaints and is doing well overall. See ROS below.    Review of Systems:     Bleeding: minimal  Menses: not yet resumed  Breast complaints: none  Sexual activity: not yet resumed  Fevers/chills: denies  Bowel/urine habits: regular  Appetite: regular  Nausea/vomiting: denies   Mood: appropriate  Headache: denies  Blurry vision/scotomata: denies  Right upper quadrant pain: denies  Doing well at home with baby.    She is formula feeding without issue, as above.    History:     OB History   Gravida Para Term Preterm AB Living   1 1 1  0 0 1    SAB IAB Ectopic Molar Multiple Live Births   0 0 0 0 0 1      # Outcome Date GA Lbr Len/2nd Weight Sex Type Anes PTL Lv   1 Term 05/14/23 [redacted]w[redacted]d / 00:31 2.925 kg (6 lb 7.2 oz) F Vertex, spon EPI, IV  LIV        Physical Exam:     Vitals: LMP 08/09/2022 (Approximate)        Results for orders placed or performed during the hospital encounter of 05/21/23   Complete Blood Count   Result Value Ref Range    WBC Count 10.0 3.4 - 10.0 x10E9/L    RBC Count 3.39 (L) 4.00 - 5.20 x10E12/L    Hemoglobin 10.7 (L) 12.0 - 15.5 g/dL    Hematocrit 16.1 (L) 36.0 - 46.0 %    MCV 90 80 - 100 fL    MCH 31.6 26.0 - 34.0 pg    MCHC 35.0 31.0 - 36.0 g/dL    Platelet Count 096 045 - 450 x10E9/L    MPV 9.6 9.1 - 12.6 fL    RDW-CV 12.5 11.7 - 14.4 %   Comprehensive Metabolic Panel (BMP, AST, ALT, T.BILI, ALKP, TP, ALB)   Result Value Ref Range    Albumin, Serum / Plasma 3.1 (L) 3.5 - 5.0 g/dL    Alkaline Phosphatase 123 (H) 38 -  108 U/L    Alanine transaminase 12 10 - 61 U/L    AST 15 5 - 44 U/L    Bilirubin, Total 0.4 0.2 - 1.2 mg/dL    Urea Nitrogen, serum/plasma 18 7 - 25 mg/dL    Calcium, total, Serum / Plasma 9.2 8.4 - 10.5 mg/dL    Chloride, Serum / Plasma 106 101 - 110 mmol/L    Creatinine 1.00 0.55 - 1.02 mg/dL    eGFRcr 75 >65 HQ/ION/6.29B2    Potassium, Serum / Plasma 3.9 3.5 - 5.0 mmol/L    Sodium, Serum / Plasma 139 135 - 145 mmol/L    Protein, Total, Serum / Plasma 7.0 6.3 - 8.6 g/dL    Carbon Dioxide, Total 26 22 - 29 mmol/L    Anion Gap 7 4 - 14    Glucose, non-fasting 77 70 - 199 mg/dL       GEN: NAD  MOOD: Good, appropriate  Sitting on couch, holding baby  No exam - video visit    PATHOLOGY REPORT      FINAL PATHOLOGIC DIAGNOSIS     Placenta, delivery:  1. Appropriate weight for stated gestational age (543 grams, 50-75th percentile for [redacted] weeks gestation).  2. Acute chorioamnionitis with chorionic plate vasculitis.  3. Features of meconium exposure.    Impression/ Recommendations:     37 y.o.  G1P1001 here for postpartum  care, doing well overall.     -follow up there for Pap, re-establish routine care    - Obstetrically stable: Reviewed recovery expectations including menses, exercise and intercourse.   - cHTN: BPs normal at home on current regimen. Will reassess at 6w PP visit, titrate down. Will need PCP.  - MOC: Condoms, reviewed increased risks for short IPI.  - Mood: Stable. Aware of our resources, AVS provided.  - Pap: Overdue, had LEEP for CIN2, declined follow-up in pregnancy. Will tentatively plan for Pap at 6w PP visit.  - AVS: PP exercises and resources.     RTC 6w as scheduled.     All of her questions were answered today, and verbalized her understanding of all that was discussed.    I spent a total of 42 minutes on this patient's care on the day of their visit excluding time spent related to any billed procedures. This time includes time spent with the patient as well as time spent documenting in the medical record, reviewing patient's records and tests, obtaining history, placing orders, communicating with other healthcare professionals, counseling the patient, family, or caregiver, and/or care coordination for the diagnoses above.        Electronically Signed by Debroah Baller, MD, May 28, 2023

## 2023-05-29 NOTE — Telephone Encounter (Signed)
Office of Population Health  Utilization & Disease Management Programs  Discharge Follow-Up Note       Provider: No action requested. FYI only.    RN Summary: Kayla Frey reports she is doing well, no concerns at this time       Nursing Assessment Below, for Provider FYI Only   Reviewed patient's chart as part of the [facility] Care Transitions Program follow-up.   Patient recently discharged from the hospital and was scheduled for a follow-up call as part of the [facility] Transitions of Care Services program.    [facility] Transitional Care Services (TCS) patients only: successful contact via manual telephone call from [facility] Utilization team RN    RN Note: No concerns at this time, everything is going well    Pain-no pain currently  Bleeding-bleeding is light at this time  Infection-no fever, no chills, no evidence of infection  Support-feels well supported at this time, my family is helping me, staying in the same building  Kayla Frey to enroll is cipher automated call series    Blood pressure-last BP was 127/63, "definitely lower than they were previously", still taking blood pressure medication regularly, no side effects of medication  [place] PCP/Specialist Appointment Date:   Future Appointments   Date Time Provider Department Center   06/04/2023 11:00 AM CPSP HEALTHCARE NAVIGATOR [facility] All Practice   06/26/2023  2:30 PM Arianna Burman Nieves, MD [facility] All [facility]     No further questions or concerns at this time. All appropriate follow-up/return precautions reviewed including when to call providers and when to call 911 or go to ED, pt verbalized understanding of instructions. Patient was given the [facility] callback number. Patient is enrolled in the longitudinal follow-up call series which is a weekly automated call series for 30 days after hospital discharge.    Alford Highland, RN  Utilization & Disease Management Programs  [place] Office of Population Health  [phone]    This patient is followed by the [place] Utilization & Disease  Management Programs. This encounter is part of a longitudinal series of calls done after hospital discharge to ensure a safe transition home. These calls address issues including discharge instructions, symptom management, medications, care coordination, and follow-up appointments.  For more information on OPH programs, please see  [email or web address]   For general questions (no PHI), please contact [email or web address]

## 2023-05-29 NOTE — Telephone Encounter (Signed)
Office of Population Health  Utilization and Disease Management Programs  Discharge Follow-Up Note       Pt currently resting/sleeping, will call Charlet back this afternoon for follow up. Pt agreed to plan.    Alford Highland, RN  Utilization and Disease Management Teams  [place] Office of Population Health  [phone]    This encounter is part of a longitudinal series of calls done after hospital discharge to ensure a safe transition home. These calls address issues including discharge instructions, symptom management, medications, care coordination, and follow-up appointments.    For more information on OPH programs, please see  [email or web address]   For general questions (no PHI), please contact [email or web address]

## 2023-06-04 ENCOUNTER — Telehealth: Admit: 2023-06-04 | Payer: MEDICAID | Primary: Student in an Organized Health Care Education/Training Program

## 2023-06-04 NOTE — Progress Notes (Addendum)
[facility] Comprehensive Perinatal Services Program (CPSP)     Summary/Next Steps:  Patient completed the Comprehensive Perinatal Services Program (CPSP):  Contact: CPSP Pool- OB CPSP HCN   SW-referred and connected. Messaged Roddie Mc as pt does not know how she will get to hospital when she goes into labor. Unsure if [facility] covers ambulance transport in this situation.  Relayed response from [place] to pt, pts options are Lyft/private care service or ambulance to get to hospital to deliver.(7/29)  RD-referred and connected. Pt does not desire additional follow up.(7/25)   WIC-referred and connected; assigned to [facility] location. Pt confirms she now has [facility] card and vouchers.(5/23)  Dental-scheduled on 8/9 with [facility] on [address].(7/25)  Painful teeth, was told probably due to increased calcium. No more pain as of (10/10)  Seen dentist but needs RCT  Will establish dental care elsewhere- [facility] not available until 09/2023  PHN-referred but will not proceed with services. Prefers to only have doula.(7/25)  Doula-referred to [facility] on 2/29. Connected/assigned to doula, Sabino Snipes, [phone]. Doula to attend delivery.(7/25)  Connected to [place]- per Doula needed extended time for postpartum care (10/10)- HCN will connect w/ [facility] and follow up w/ pt   Other-   Pt advised to schedule antenatal testing x2 a week. Pt not sure if she should be concerned or what's the reason for frequency, pt does not feel like she will be able to schedule/attend all these appointments. Escalated to Dr. Rodman Pickle.  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   Pt expressed having difficulty complying with med schedule, does not have pill organizer. Mailed 7 day AM/PM pill box.(5/23)   Needs to complete medi-cal enrollment- has follow- up appt w/ medi-cal on 10/18 (10/10)    Baby Supplies-Sent [facility] [employer] website/info.(7/26)  MyChart-sent [facility] medical transportation contact info consider pt  lyft/ubers to all medical appointments.(7/26)  [place]- informed patient on 2/29. Sent information via MyChart.    OB RPM-Pt is enrolled into OB RPM (9/3)  Pt eligible for OB RPM Program- Pt declined outreach at Antepartum and requested later outreach- Team plans to outreach again near [place]GA, 05/09. Pt expressed interest in OB RPM program, during tri 2 CPSP assessment, now that school is over and she would like follow up around June 10. HCN relayed pt interest to [facility].(5/23)  CPSP Postpartum Assessment completed 10/10      Assessment and Individualized Care Plan  Assessment for: Postpartum Assessment    Psychosocial:   Baby:    1. Babys DOB: 05/14/2022   2. Name: Kensleigh Shawver   female   3. Weight at birth (oz): 103.18   4. Length at birth (in):   5. Weeks gestation: 38 3/7   6. Type of delivery: Vertex, spontaneous   7.  If multiple births, give information on other babies: n/a     Psychosocial Risks/Concerns Psychosocial Individualized Care Plan   8. Problems with delivery? No  N/A   9. Patient has mental health problems that need follow up? No  N/A   10. Baby has current medical problems? Yes, describe: umbilical cord around her ne- possible ck, wasn't breathing- 2 small strokes and 2 scars on brain taht will need neuro follow up every 2-r months- in [place] for 2 weeks  N/A   11. Patient coping with demands of baby? Yes N/A    12. Family members adjusting to baby? Yes N/A   PHQ-9 Total Score:  2 (05/23/2023  2:16 AM)  GAD-7 Total Score: 5 (05/23/2023  2:14 AM)    13. Patient experiencing postpartum emotional problems:     Over the past two weeks, have you felt down, depressed or hopeless? No      Over the past two weeks, have you had little interest or pleasure in doing things? No     For past month, more days than not, have you felt anxious, nervous, worried, irritable, or overwhelmed? No  N/A    14. Do you drink alcohol? Yes, describe: wine occasionally but has not had any      If not  breastfeeding or pregnant:  >3 drinks/day, 7/week in past three months is risk.    Social History     Substance and Sexual Activity   Alcohol Use Not Currently    Comment: has used socially in past; no history of problem use     N/A   15. Have you used drugs other than prescribed in the past year? No  Social History     Substance and Sexual Activity   Drug Use Never    Comment: no history of Rx med abuse or illicit substance use     N/A   16. Do you smoke or are you exposed to second hand smoke? No  Social History     Tobacco Use   Smoking Status Former    Current packs/day: 0.00    Types: Cigarettes    Start date: 09/14/2014    Quit date: 12/30/2019    Years since quitting: 3.4   Smokeless Tobacco Never   Tobacco Comments    taking chantix to help with smoking cessation     Patient understands not to smoke around baby   17. Are you getting the support you need from your family/partner? Yes N/A   18. Within the past year, has your partner hit, slapped, kicked, choked, forced you to have sex, or otherwise physically or emotionally hurt you? No N/A   19. Plans for after baby: School  N/A   20. Needs help in finding childcare? No  N/A   21. Needs essential baby supplies (diapers, clothing, other supplies)?  No N/A   22. Other psychosocial problem? none N/A   Assessment status: Completed  Minutes spent: 15  Date assessment completed: 06/04/2023    Health Education:   Health Education Risks/Concerns Health Education Individualized Care Plan    23. Questions about body changes, postpartum discomforts or self-care after pregnancy? No  N/A   24. Was your previous pregnancy intended? Yes N/A   25. If no, was it because your partner pressured you or messed with your birth control? No  N/A   26. Plans for future children:   Number of children planned: 2  Spacing of children planned: year    Does partner support your decision for use of birth control? Yes N/A   27. Exposed to chemicals or toxins at home or elsewhere? No  N/A   28.  Has health insurance for her own health care in the future? Yes   N/A   29. Has primary care provider for her regular medical check ups? Yes  Cottie Banda, MD N/A   30. Any health problems that need follow up? (diabetes, hypertension, obesity, depression, etc.) Yes, describe: Blood Pressure was high during pregnancy but on RX, checks BP regularly and on OB RPM Program ?  N/A   31. Did client see a dentist in the past year?  Yes? N/A   32. Any sore/bleeding gums, sensitive/loose teeth, bad taste or smell in mouth? No  N/A   33. Does baby have a pediatrician? Yes   Beryle Beams, MD  Referred to [facility]/pediatric provider:     64. Questions about newborn care?   Number wet diapers/day: 8-12  Number dirty diapers/day: 6-8 N/A   35. Does client have a dentist for the baby? No N/A    36. Reviewed infant safety: Yes ?  car seat  sleeping arrangement   chemicals/cleaning supplies ?   electricity?  hot water temp  exposed water (toilets, pools)??  other:  N/A   37. Questions about newborn immunizations, health? No N/A   38. Other interest or need? No Patient plan:    Assessment status: Completed  Minutes spent: 15  Date assessment completed: 06/04/2023    Nutrition:   Risks/Dietary Issues Nutrition Individualized Care Plan    39.   Total weight gain: see chart     Weight at this visit: No Weight Entered for Current Encounter   BMI: There is no height or weight on file to calculate BMI.  Desired weight: n/a   Desired BMI:n/a   Client is: N/A   N/A   40. How is the baby being fed?   Exclusive formula    Number of times a day baby eats: 8-9x per day  Frequency: 2-3 hours     Using pacifier? Yes    If breastfeeding: Has enough milk? N/A  Are nipples cracked or sore? N/A  Does baby take the breast easily? N/A  Any questions about breastfeeding? N/A N/A   41. Eats less than 3 times a day? No Agreed to eat every 3-4 hours      42. Fluid intake: Does client drink less than 8 cups/day? No  N/A   43. Other drinks; sugary soda,  caffeine: Yes, describe:  N/A   44. Are you still taking your prenatal vitamins? Yes  N/A   45. Complete Perinatal Dietary Assessment   24-hour Perinatal Dietary Recall  N/A   46. Enrolled in [facility]? Yes  If yes, list site:  N/A   47. Other risk or dietary issue? N/a Patient plan:    Assessment status: Completed  Minutes spent: 15  Date assessment completed: 06/04/2023    Perinatal Dietary Assessment  Dietary Assessment  Care Plan/Interventions   On a typical day, how many servings of Vegetables do you eat?   1 serving is equal to: 1 cup raw or cooked vegetables, 2 cups raw leafy greens, 1 cup 100% vegetable juice    3 or more servings a day    The more vegetables you eat, the better., Aim for at least 3 servings/day. , Choose some that are dark green or orange., Choose fresh, frozen, or canned with no added sauce., and Continue with 3 Servings or more a day             2. On a typical day, how many servings of Fruit do you eat?   1 serving is equal to: 1 cup or piece of fruit, 1 cup 100% fruit juice,  cup dried fruit    2 or more servings a day    Eat fruits of many colors. Aim for 2 or more servings/day, Choose fresh, frozen or canned with no added sugar, Choose 100% fruit juice. Limit to one small cup a day, and Continue with 3 Servings or more a day   3. On a  typical day, how many servings of Dairy food do you eat?   1 serving is equal to: 1 cup milk or yogurt, 1 cup soy milk with added calcium, 1 oz. hard cheese, 1/3 cup shredded cheese      Less than 3 servings a day   Aim for 3 servings/day, 4 servings if a teen., Choose milk or yogurt, nonfat or low-fat (1%)., Try low-fat cheeses., Try soy milk with calcium., and If patient does not use milk products, refer to STT N Do You Have Trouble with Milk Foods? and Foods Rich in Calcium   4. On a typical day, how many servings of Protein food do you eat?   1 serving is: 1 oz. meat, fish or poultry, 1 egg,  oz. or small handful nuts, 1 tablespoon peanut butter,  cup  cooked dry beans, peas, lentils,  cup tofu      Less than 6 servings a day   Aim for 6 servings/day., Eat beans and lentils instead of meat at some meals., Eat lean meat (90% lean or higher)., Limit high-fat meats like sausage, hot dogs and bologna., and Grill, broil, or bake instead of frying.   5. On a typical day, how many servings of Grains do you eat?   1 serving is: 1 slice bread or  bagel, 1 cup dry cereal,  cup cooked rice, pasta or hot cereal, 3 cups popped corn, 1 small (6) corn or flour tortilla     Less than 6 servings a day Aim for 6-8 servings/day.   6. On a typical day, do you eat solid fats such as lard, shortening, stick margarine or butter?    Yes Use small amounts of healthy liquid oils such as canola or olive., Avoid solid fats such as lard, shortening, stick margarine or butter., and Limit fried foods.   7. On a typical day, how many cups of these beverages do you drink? Soda, fruit punch, sport drinks or energy drinks?    1 Drink plenty of water. and Avoid sugary drinks like soda, fruit punch, sport drinks or energy drinks.      8.  On a typical day, how many cups of these beverages do you drink? Coffee, tea, soda or energy drinks.     1 Limit coffee to one cup a day (pregnant). and Limit coffee to 2-3 cups a day (breastfeeding).     9.  On a typical day, do you eat these extra foods?    Candy, chips, cake, cookies, donuts, muffins, ice cream, frozen yogurt, sour cream, mayonnaise  No Limit foods high in fat and sugar. and Choose low-fat or non-fat products.   Assessment status: Completed  Minutes spent: 15  Date assessment completed: 06/04/2023    Lanette Hampshire  CPSP Healthcare Navigator  [facility] of [employer]  [phone]

## 2023-06-11 NOTE — Telephone Encounter (Signed)
PATIENT OUTREACH  Attempt #1: LVM requesting call back. Will attempt again.     A phone call was made to inform her that there have been no recent uploads of blood pressure readings. We need to verify if the blood pressure monitor was received and also confirm if there are no IT issues causing this discrepancy.     FOLLOW-UP  [x]  Awaiting the patient's upload of recent BP readings

## 2023-06-11 NOTE — Progress Notes (Signed)
Office of Population Health Peripartum Remote Blood Pressure Management Program     Blood pressure readings in the patient flowsheet are reviewed twice a week to identify any values that did not trigger an inbasket notification, but may warrant further follow up.    Send a high priority staff message to P OB HIGH RISK CLINICAL for OB RN to call patient if:   []  Patient has not had a previous diagnosis of hypertension (chronic hypertension, gestational hypertension, preeclampsia, or superimposed preeclampsia) AND has even one mildly elevated BP: Single SBP of >=140 or DBP >=90. OB RN to follow OB clinic's current clinical protocol w/ in 72 hours.       Schedule an appointment w/ High Risk NP Lequita Asal) or with Dr. Eugenie Birks for [facility] or [facility] patients unless patient is seeing an MFM or High Risk NP (ie: scheduled in the COM DEP) or MD within 1 week if any of the following occur:  *unless patient has upcoming appt with a Maternal Fetal Medicine provider (will show as scheduled in the COM DEP)    []  >50% of recorded BPs are SBP >=135 or DBP >=85 (for patients with [facility] or diagnosis of HDP not on meds): appt w/in 1 week, to consider starting medications   []  Two occasions of SBP of >=150 or DBP >=100 (for patients with CHTN or diagnosis of HDP): appt w/in 1 week, to consider starting/adjusting medications   []  Any triage visit or admission related to HTN (whether CHTN or HDP): appt w/in 1 week (as a follow-up visit)   []  Monthly for patients on antihypertensive medications   []  Any patient who reached out with questions that the [facility] team cant answer   []  Postpartum at 1-2 weeks   []  Postpartum at 5-6 weeks, prior to discontinuation of program       No Further Action Needed For Now   []  No concerning BP readings upon review.   []  No BPs since last chart review. If no reading for >7 days, patient will receive automatic reminder via MyChart.   [x]  No BPs since last chart review. If no BP reading  for >14 days, health care navigator will follow up with patient.     []  Other: RN Notes:      Vassie Moselle, RN  [place] Office of [employer]  [phone]

## 2023-06-15 NOTE — Telephone Encounter (Signed)
PATIENT OUTREACH  Spoke with patient       A phone call was made to inform her that there have been no recent uploads of blood pressure readings.     The HCN encouraged patient to try uploading a BP reading so that we can monitor their blood pressure and emphasized the importance of regular monitoring. Patient was also advised to reach out if  she encounters any issues. The patient acknowledged understanding and verbalizes that she will attempt to take bp reading when she gets home later on today. She is currently out and has been forgetting and been trying to take care of the baby.     FOLLOW-UP  [x]  Awaiting the patient's  upload of BP readings

## 2023-06-15 NOTE — Progress Notes (Signed)
Office of Population Health Peripartum Remote Blood Pressure Management Program     Blood pressure readings in the patient flowsheet are reviewed twice a week to identify any values that did not trigger an inbasket notification, but may warrant further follow up.    Send a high priority staff message to P OB HIGH RISK CLINICAL for OB RN to call patient if:   []  Patient has not had a previous diagnosis of hypertension (chronic hypertension, gestational hypertension, preeclampsia, or superimposed preeclampsia) AND has even one mildly elevated BP: Single SBP of >=140 or DBP >=90. OB RN to follow OB clinic's current clinical protocol w/ in 72 hours.       Schedule an appointment w/ High Risk NP Lequita Asal) or with Dr. Eugenie Birks for [facility] or [facility] patients unless patient is seeing an MFM or High Risk NP (ie: scheduled in the COM DEP) or MD within 1 week if any of the following occur:  *unless patient has upcoming appt with a Maternal Fetal Medicine provider (will show as scheduled in the COM DEP)    []  >50% of recorded BPs are SBP >=135 or DBP >=85 (for patients with [facility] or diagnosis of HDP not on meds): appt w/in 1 week, to consider starting medications   []  Two occasions of SBP of >=150 or DBP >=100 (for patients with CHTN or diagnosis of HDP): appt w/in 1 week, to consider starting/adjusting medications   []  Any triage visit or admission related to HTN (whether CHTN or HDP): appt w/in 1 week (as a follow-up visit)   []  Monthly for patients on antihypertensive medications   []  Any patient who reached out with questions that the [facility] team cant answer   []  Postpartum at 1-2 weeks   []  Postpartum at 5-6 weeks, prior to discontinuation of program       No Further Action Needed For Now   []  No concerning BP readings upon review.   []  No BPs since last chart review. If no reading for >7 days, patient will receive automatic reminder via MyChart.   [x]  No BPs since last chart review. If no BP reading  for >14 days, health care navigator will follow up with patient.     [x]  Other: RN Notes: No BP. Last BP logged 9/9. HCN has tried to outreach the pt 10/17. HCN to follow-up       Kayla Frey, Teacher, early years/pre of [employer]  [phone]

## 2023-06-17 NOTE — Progress Notes (Signed)
Office of Population Health Peripartum Remote Blood Pressure Management Program     Blood pressure readings in the patient flowsheet are reviewed twice a week to identify any values that did not trigger an inbasket notification, but may warrant further follow up.    Send a high priority staff message to P OB HIGH RISK CLINICAL for OB RN to call patient if:   []  Patient has not had a previous diagnosis of hypertension (chronic hypertension, gestational hypertension, preeclampsia, or superimposed preeclampsia) AND has even one mildly elevated BP: Single SBP of >=140 or DBP >=90. OB RN to follow OB clinic's current clinical protocol w/ in 72 hours.       Schedule an appointment w/ High Risk NP Lequita Asal) or with Dr. Eugenie Birks for [facility] or [facility] patients unless patient is seeing an MFM or High Risk NP (ie: scheduled in the COM DEP) or MD within 1 week if any of the following occur:  *unless patient has upcoming appt with a Maternal Fetal Medicine provider (will show as scheduled in the COM DEP)    []  >50% of recorded BPs are SBP >=135 or DBP >=85 (for patients with [facility] or diagnosis of HDP not on meds): appt w/in 1 week, to consider starting medications   []  Two occasions of SBP of >=150 or DBP >=100 (for patients with CHTN or diagnosis of HDP): appt w/in 1 week, to consider starting/adjusting medications   []  Any triage visit or admission related to HTN (whether CHTN or HDP): appt w/in 1 week (as a follow-up visit)   []  Monthly for patients on antihypertensive medications   []  Any patient who reached out with questions that the [facility] team cant answer   []  Postpartum at 1-2 weeks   []  Postpartum at 5-6 weeks, prior to discontinuation of program       No Further Action Needed For Now   []  No concerning BP readings upon review.   []  No BPs since last chart review. If no reading for >7 days, patient will receive automatic reminder via MyChart.   [x]  No BPs since last chart review. If no BP reading  for >14 days, health care navigator will follow up with patient.     [x]  Other: RN Notes: Navigator sent reminder yesterday.       Vassie Moselle, RN  [place] Office of [employer]  [phone]

## 2023-06-19 NOTE — Telephone Encounter (Signed)
Population Health Peripartum Remote Blood Pressure Management Program     PATIENT OUTREACH  A voicemail was left was made to inform the patient about their upcoming graduation and completion of the Peripartum Remote Blood Pressure Management Program on 06/26/2023    Attempt #1: LVM requesting call back. Will attempt again.      Action Needed for Program   [x]  Notify the patient of their graduation from the program on11/08/2022   [x]  Ensure the patient acknowledges that they must return the blood pressure cuff to [place] during their OB ~6 week postpartum follow-up appointment 06/26/2023   [x]  Send the Postpartum Qualtrics Survey Link via MyChart to the patient.   Post Partum Qualtrics Survey Link   [x]  Confirmed patient's 6 week postpartum follow-up appointment with COM DEP OB (Maternal Fetal Medicine provider) if not, schedule appointment w/ High-Risk NP Lequita Asal, NP in the [facility] DEP)   [x]  Confirmed patient has a Primary Care Provider (PCP)   [x]  If patient does not have a PCP, Call New Patient line [phone] to establish Primary Care Provider at [facility]

## 2023-06-22 NOTE — Telephone Encounter (Signed)
PATIENT OUTREACH  Attempt #2: LVM requesting call back     A voicemail was left for the patient to inform her about her upcoming graduation and completion of the Peripartum Remote Blood Pressure Management Program on 06/26/2023. The HCN also noted in the message that there haven't been any recent uploads of blood pressure readings.    FOLLOW-UP  [x]  Awaiting the patient's synchronization of the BP monitor and the upload of BP readings

## 2023-06-22 NOTE — Progress Notes (Signed)
Office of Population Health Peripartum Remote Blood Pressure Management Program     Blood pressure readings in the patient flowsheet are reviewed twice a week to identify any values that did not trigger an inbasket notification, but may warrant further follow up.    Send a high priority staff message to P OB HIGH RISK CLINICAL for OB RN to call patient if:   []  Patient has not had a previous diagnosis of hypertension (chronic hypertension, gestational hypertension, preeclampsia, or superimposed preeclampsia) AND has even one mildly elevated BP: Single SBP of >=140 or DBP >=90. OB RN to follow OB clinic's current clinical protocol w/ in 72 hours.       Schedule an appointment w/ High Risk NP Lequita Asal) or with Dr. Eugenie Birks for [facility] or [facility] patients unless patient is seeing an MFM or High Risk NP (ie: scheduled in the COM DEP) or MD within 1 week if any of the following occur:  *unless patient has upcoming appt with a Maternal Fetal Medicine provider (will show as scheduled in the COM DEP)    []  >50% of recorded BPs are SBP >=135 or DBP >=85 (for patients with [facility] or diagnosis of HDP not on meds): appt w/in 1 week, to consider starting medications   []  Two occasions of SBP of >=150 or DBP >=100 (for patients with CHTN or diagnosis of HDP): appt w/in 1 week, to consider starting/adjusting medications   []  Any triage visit or admission related to HTN (whether CHTN or HDP): appt w/in 1 week (as a follow-up visit)   []  Monthly for patients on antihypertensive medications   []  Any patient who reached out with questions that the [facility] team cant answer   []  Postpartum at 1-2 weeks   []  Postpartum at 5-6 weeks, prior to discontinuation of program       No Further Action Needed For Now   []  No concerning BP readings upon review.   []  No BPs since last chart review. If no reading for >7 days, patient will receive automatic reminder via MyChart.   [x]  No BPs since last chart review. If no BP reading  for >14 days, health care navigator will follow up with patient.     [x]  Other: RN Notes: No BP logged. HCN has been reminding the pt. Discuss closure end of the week if pt did not log BP       Raeanne Deschler, Teacher, early years/pre of [employer]  [phone]

## 2023-06-24 NOTE — Telephone Encounter (Signed)
This encounter was completed by a Tour manager from the [employer] of [employer] Comprehensive Perinatal Services Program (CPSP) and Birth Equity Enhanced Care Management [facility]) team.     First outreach attempt to [facility] for  [facility] program.  This Clinical research associate spoke with [name].   Patient accepts CPSP and ECM and is enrolled in both.        Lanette Hampshire  Health Care Navigator  Office of Population Health   [facility]    Pt accepted

## 2023-06-25 NOTE — Telephone Encounter (Signed)
Population Health Peripartum Remote Blood Pressure Management Program     PATIENT OUTREACH  A phone call was made to inform the patient about their graduation and completion of the Peripartum Remote Blood Pressure Management Program.     Spoke with patient. Patient has only submitted one blood pressure reading (on 9/9) since enrollment, and her 6-week postpartum mark is tomorrow, 11/1.  HCN has been reaching out and leaving multiple voicemails to remind her to upload readings, but she hasnt engaged much with our program. While we planned to close her out today, HCN was able to reach her and provided the graduation information. Patient verbalizes understanding and is aware of graduation and will Return BP Monitor at [facility] Appointment 06/26/2023.     Action Needed for Program   [x]  Notify the patient of their graduation from the program.   [x]  Ensure the patient acknowledges that they must return the blood pressure cuff to [place] during their OB ~6 week postpartum follow-up appointment 06/26/2023   [x]  Send the Postpartum Qualtrics Survey Link via MyChart to the patient.   Post Partum Qualtrics Survey Link   [x]  Confirmed patient's 6 week postpartum follow-up appointment with COM DEP OB (Maternal Fetal Medicine provider) if not, schedule appointment w/ High-Risk NP Lequita Asal, NP in the [facility] DEP)   [x]  Confirmed patient has a Primary Care Provider (PCP)   [x]  If patient does not have a PCP, Call New Patient line [phone] to establish Primary Care Provider at [facility]

## 2023-06-25 NOTE — Telephone Encounter (Signed)
Synopsis SmartLink 06/24/2023    22:38 06/24/2023    22:36 05/23/2023    02:16 05/23/2023    02:14 05/12/2023    21:09 05/01/2023    14:54   PHQ-9/GAD-7 Clinician/Patient Input   PHQ-9 Score Clinican/Patient Entered 1  2  1 10    GAD-7 Score Clinican/Patient Entered  3  5 0 9                11/23/2019     1:34 PM   YBOCS Date   YBOCS Score 24

## 2023-06-26 NOTE — Progress Notes (Deleted)
MFM Postpartum Visit    I performed this consultation using real-time Telehealth tools, including a live video connection between my location and the patient's location. Prior to initiating the consultation, I obtained informed verbal consent to perform this consultation using Telehealth tools and answered all the questions about the Telehealth interaction.    Interval  History:     Kayla Frey is a 37 y.o. old G1P1001 who is *** weeks PP from NSVD after induction of labor at 38 weeks for chronic hypertension. Her postpartum course was unremarkable and she was discharged on postpartum day 2.    Her daughter, Kayla Frey, was born weighing 2925g, with Apgars of 2/4/5. She was hypotonic at birth with poor respiratory drive, so underwent therapeutic cooling. 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 She was discharged in good condition on 10/2.    [name] was followed by MFM for chronic HTN, PTSD, BMI 57, history of sleeve gastrectomy, CINII.    [name] states she's doing well today. It was hard to have Kayla Frey in the ICN and she had questions about her delivery, but all were answered and she's feeling good to have Kayla Frey home. Restarted lexapro 30 mg and propranolol as needed for anxiety. Mood is good, feeling well.    Taking nifedipine 60mg  BID (meds were uptitrated postpartum). BPs are normalizing.     Feet are less swollen now.    Having scant bleeding now.     Today, she has no acute complaints and is doing well overall. See ROS below.    Review of Systems:     Bleeding: minimal  Menses: not yet resumed  Breast complaints: none  Sexual activity: not yet resumed  Fevers/chills: denies  Bowel/urine habits: regular  Appetite: regular  Nausea/vomiting: denies   Mood: appropriate  Headache: denies  Blurry vision/scotomata: denies  Right upper quadrant pain: denies  Doing well at home with baby.    She is formula feeding without issue, as above.    History:     OB History   Gravida Para Term Preterm AB Living   1 1 1  0 0 1    SAB IAB Ectopic Molar Multiple Live Births   0 0 0 0 0 1      # Outcome Date GA Lbr Len/2nd Weight Sex Type Anes PTL Lv   1 Term 05/14/23 [redacted]w[redacted]d / 00:31 2.925 kg (6 lb 7.2 oz) F Vertex, spon EPI, IV  LIV        Physical Exam:     Vitals: LMP 08/09/2022 (Approximate)        Results for orders placed or performed during the hospital encounter of 05/21/23   Complete Blood Count   Result Value Ref Range    WBC Count 10.0 3.4 - 10.0 x10E9/L    RBC Count 3.39 (L) 4.00 - 5.20 x10E12/L    Hemoglobin 10.7 (L) 12.0 - 15.5 g/dL    Hematocrit 64.3 (L) 36.0 - 46.0 %    MCV 90 80 - 100 fL    MCH 31.6 26.0 - 34.0 pg    MCHC 35.0 31.0 - 36.0 g/dL    Platelet Count 329 518 - 450 x10E9/L    MPV 9.6 9.1 - 12.6 fL    RDW-CV 12.5 11.7 - 14.4 %   Comprehensive Metabolic Panel (BMP, AST, ALT, T.BILI, ALKP, TP, ALB)   Result Value Ref Range    Albumin, Serum / Plasma 3.1 (L) 3.5 - 5.0 g/dL    Alkaline Phosphatase 123 (H) 38 -  108 U/L    Alanine transaminase 12 10 - 61 U/L    AST 15 5 - 44 U/L    Bilirubin, Total 0.4 0.2 - 1.2 mg/dL    Urea Nitrogen, serum/plasma 18 7 - 25 mg/dL    Calcium, total, Serum / Plasma 9.2 8.4 - 10.5 mg/dL    Chloride, Serum / Plasma 106 101 - 110 mmol/L    Creatinine 1.00 0.55 - 1.02 mg/dL    eGFRcr 75 >16 XW/RUE/4.54U9    Potassium, Serum / Plasma 3.9 3.5 - 5.0 mmol/L    Sodium, Serum / Plasma 139 135 - 145 mmol/L    Protein, Total, Serum / Plasma 7.0 6.3 - 8.6 g/dL    Carbon Dioxide, Total 26 22 - 29 mmol/L    Anion Gap 7 4 - 14    Glucose, non-fasting 77 70 - 199 mg/dL       GEN: NAD  MOOD: Good, appropriate  Sitting on couch, holding baby  No exam - video visit    PATHOLOGY REPORT      FINAL PATHOLOGIC DIAGNOSIS     Placenta, delivery:  1. Appropriate weight for stated gestational age (543 grams, 50-75th percentile for [redacted] weeks gestation).  2. Acute chorioamnionitis with chorionic plate vasculitis.  3. Features of meconium exposure.    Impression/ Recommendations:     37 y.o.  G1P1001 here for postpartum  care, doing well overall.     -follow up there for Pap, re-establish routine care    - Obstetrically stable: Reviewed recovery expectations including menses, exercise and intercourse.   - cHTN: BPs normal at home on current regimen. Will reassess at 6w PP visit, titrate down. Will need PCP.  - MOC: Condoms, reviewed increased risks for short IPI.  - Mood: Stable. Aware of our resources, AVS provided.  - Pap: Overdue, had LEEP for CIN2, declined follow-up in pregnancy. Will tentatively plan for Pap at 6w PP visit.  - AVS: PP exercises and resources.     RTC 6w as scheduled.     All of her questions were answered today, and verbalized her understanding of all that was discussed.    I spent a total of *** minutes on this patient's care on the day of their visit excluding time spent related to any billed procedures. This time includes time spent with the patient as well as time spent documenting in the medical record, reviewing patient's records and tests, obtaining history, placing orders, communicating with other healthcare professionals, counseling the patient, family, or caregiver, and/or care coordination for the diagnoses above.        Electronically Signed by Debroah Baller, MD, June 26, 2023

## 2023-07-07 MED ORDER — NIFEDIPINE ER 60 MG TABLET,EXTENDED RELEASE 24 HR
60 mg | ORAL_TABLET | Freq: Two times a day (BID) | ORAL | 0 refills | Status: DC
Start: 2023-07-07 — End: 2023-08-14

## 2023-07-07 NOTE — Telephone Encounter (Signed)
Current pended order:  NIFEdipine (PROCARDIA XL) 60 mg 24 hr tablet  Take 1 tablet (60 mg total) by mouth every 12 (twelve) hours    Last time approved:  NIFEdipine (PROCARDIA XL) 60 mg 24 hr tablet  Take 1 tablet (60 mg total) by mouth every 12 (twelve) hours  Dispense: 60 tablet Refill: 0  A3 M&B MB by Jason Coop MARIE on 05/15/23        Last office visit:    09/10/2021   Last video visit:     09/22/2022   Last scheduled telephone encounter: Visit date not found  Last telemedicne (IVV) visit:   Visit date not found  Last telephone encounter:    09/26/2022    Next appointment:  07/31/2023 Cottie Banda, MD    Last  BP:  BP Readings from Last 3 Encounters:   05/21/23 139/80   05/16/23 134/70   05/01/23 128/75       Last Labs:    Lab Results   Component Value Date    Creatinine 1.00 05/21/2023    Sodium, Serum / Plasma 139 05/21/2023    Potassium, Serum / Plasma 3.9 05/21/2023    Hemoglobin A1c 4.8 10/29/2022    Cholesterol, Total 198 03/05/2023    LDL Cholesterol 113 03/05/2023    Cholesterol, HDL 69 03/05/2023    Thyroid Stimulating Hormone 0.85 12/17/2022    Hemoglobin 10.7 (L) 05/21/2023    eGFRcr 75 05/21/2023    eGFR - low estimate 90 10/24/2020    eGFR - high estimate 104 10/24/2020       If patient has not been seen in clinic for 12 months or more I have routed encounter to the administrative team to book a follow-up appointment.

## 2023-07-07 NOTE — Telephone Encounter (Signed)
From: [name]  To: Office of [place]  Sent: 07/04/2023 11:53 PM PST  Subject: Medication Renewal Request    Refills have been requested for the following medications:   Other - The suppression medication thats starts with a V    Preferred pharmacy: [id number] - [place], [place] - [address] AT [facility] [address]  Delivery method: Pickup      Medication renewals requested in this message routed separately:     NIFEdipine (PROCARDIA XL) 60 mg 24 hr tablet

## 2023-08-14 ENCOUNTER — Ambulatory Visit
Admit: 2023-08-14 | Payer: MEDICAID | Attending: Student in an Organized Health Care Education/Training Program | Primary: Student in an Organized Health Care Education/Training Program

## 2023-08-14 DIAGNOSIS — F419 Anxiety disorder, unspecified: Secondary | ICD-10-CM

## 2023-08-14 DIAGNOSIS — Z23 Encounter for immunization: Secondary | ICD-10-CM

## 2023-08-14 MED ORDER — PROPRANOLOL 10 MG TABLET
10 | Freq: Every day | ORAL | Status: DC | PRN
Start: 2023-08-14 — End: 2023-08-14

## 2023-08-14 MED ORDER — PROPRANOLOL 10 MG TABLET
10 | ORAL_TABLET | Freq: Every day | ORAL | 3 refills | 30.00000 days | Status: DC | PRN
Start: 2023-08-14 — End: 2024-01-04

## 2023-08-14 MED ORDER — COVID VACC 2024-2025 (12 YRS UP) (PFIZER)(PF) 30 MCG/0.3 ML IM SYRINGE
30 | Freq: Once | INTRAMUSCULAR | Status: AC
Start: 2023-08-14 — End: 2023-08-14
  Administered 2023-08-14: 22:00:00 30 ug via INTRAMUSCULAR

## 2023-08-14 MED ORDER — ESCITALOPRAM 20 MG TABLET
20 mg | ORAL_TABLET | Freq: Every day | ORAL | 3 refills | Status: DC
Start: 2023-08-14 — End: 2023-10-19

## 2023-08-14 MED ORDER — WEGOVY 0.25 MG/0.5 ML SUBCUTANEOUS PEN INJECTOR
0.25 mg/0.5 mL | SUBCUTANEOUS | 0 refills | Status: DC
Start: 2023-08-14 — End: 2023-09-10

## 2023-08-14 MED ORDER — ESCITALOPRAM 20 MG TABLET
20 | Freq: Every day | ORAL | Status: DC
Start: 2023-08-14 — End: 2023-08-14

## 2023-08-14 MED ORDER — VALACYCLOVIR 500 MG TABLET
500 | Freq: Every day | ORAL | Status: DC
Start: 2023-08-14 — End: 2023-08-14

## 2023-08-14 MED ORDER — VALACYCLOVIR 500 MG TABLET
500 | ORAL_TABLET | Freq: Every day | ORAL | 3 refills | Status: DC | PRN
Start: 2023-08-14 — End: 2024-08-22

## 2023-08-14 MED ORDER — DICLOFENAC 1 % TOPICAL GEL
1 | Freq: Four times a day (QID) | TOPICAL | 3 refills | 13.00000 days | Status: DC | PRN
Start: 2023-08-14 — End: 2024-07-18

## 2023-08-14 MED ORDER — LOSARTAN 25 MG TABLET
25 mg | ORAL_TABLET | Freq: Every day | ORAL | 11 refills | Status: DC
Start: 2023-08-14 — End: 2023-09-30

## 2023-08-14 MED ORDER — FLU VACCINE TS 2024-25(6MOS UP)(PF) 45 MCG(15MCG X3)/0.5 ML IM SYRINGE
45 | Freq: Once | INTRAMUSCULAR | Status: AC
Start: 2023-08-14 — End: 2023-08-14
  Administered 2023-08-14: 22:00:00 0 mL via INTRAMUSCULAR

## 2023-08-14 NOTE — Telephone Encounter (Signed)
 Appreciate pharm team help with PA for wegovy- prescribed for patient today (please see same day clin encounter)    Thanks!    Starting weight: 360 lb    Height: 170 cm    Starting BMI: 56.38    Is this patient enrolled in medical weight loss and unable to lose weight?   This patient has been enrolled in a medical weight loss program. This program includes lifestyle change counseling on reduced calorie diets, exercise, and behavioral therapy. Despite being in the program for over 6 months, this patient has not lost weight.    Enrolled in [place] weight loss program since 2021. 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   I have checked my patient for hypothyroidism. This patient's TSH level was normal on 12/17/22. I do not believe this patient has any other hormonal imbalance, like hypercortisolism, because this patient does not have any other signs or symptoms related to hypercortisolism (no moon facies, easy bruising, purple striae, muscle wasting, or dorsocervical fat pad)    What other medications for weight loss have been tried?  Has tried diet, exercise, bariatric surgery and supervised weight loss program with [place] [facility]. Previously prescribed zepbound which was denied by insurance    Why did they fail?  Ineffective    When were they tried? 2021- 2024    What comorbidities does this patient have HLD, HTN, knee pain     I, as the provider, attest that the patient has been counseled on ALL of the following:  --Realistic expectations of weight loss  --Need for continued calorie reduction and exercise, as able  --Chronic nature of weight-loss pharmacotherapy  --Serious safety concerns and common tolerability concerns with the requested product

## 2023-08-14 NOTE — Patient Instructions (Signed)
 VISIT SUMMARY:  During today's visit, we addressed your wrist pain, weight management, and prescription refills. We also reviewed your current medications and discussed your stable mood symptoms. Additionally, we talked about your blood pressure management and general health maintenance, including vaccinations.    YOUR PLAN:  -DE QUERVAIN'S TENOSYNOVITIS: This condition involves pain and tenderness on the thumb side of your wrist. You should wear a thumb spica splint daily for two weeks, then nightly for two more weeks or until symptoms improve. Continue taking Tylenol up to 4 grams per day for pain, apply diclofenac gel every 4-6 hours as needed, and ice the wrist to reduce inflammation.    -DEPRESSION/ANXIETY: Your symptoms of depression and anxiety are stable. Continue taking Lexapro 20mg  daily and add Propranolol 10mg  as needed for anticipatory anxiety.    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 Keep Valtrex on hand for potential outbreaks and consider swabbing any new oral lesions to confirm the diagnosis.    -HYPERTENSION: Your blood pressure is currently well-controlled. We will discontinue Nifedipine and start Losartan 25mg  daily. Please check a metabolic panel in 1-2 weeks after starting Losartan and schedule a nurse visit in 2-3 weeks to check your blood pressure and kidney function.    -OBESITY: You are working on [employer] and will start Wegovy 0.25mg  every seven days. We will check in after three weeks to assess your tolerance and consider a potential dose increase.    -GENERAL HEALTH MAINTENANCE: You received your COVID and flu vaccines today. We will schedule a follow-up visit to discuss cervical screening and weight loss medication.    INSTRUCTIONS:  Please follow up in 2-3 weeks for a nurse visit to check your blood pressure and kidney function. Additionally,  follow-up visit to discuss cervical screening and weight loss medication.

## 2023-08-14 NOTE — Progress Notes (Signed)
 Subjective    Kayla Frey is a 37 y.o. female who presents with the following:    Chief Complaint              Weight management  Discuss Ozempic    Wrist Pain Left wrist     Medication Refill     Immunizations                History of Present Illness     Kayla Frey is a 54W w h/o prior tobacco use, PCOS, 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       - 09/22/22 last clinic visit w me for weight, infertility and mood sxs    #Post-partum  - 04/2023 had a baby!    #Obesity  - previously referred to [facility] clinic and submitted PA for zepbound  - did not start as found out was pregnant   Wt Readings from Last 3 Encounters:   08/14/23 (!) 163.3 kg (360 lb)   05/13/23 (!) 166 kg (365 lb 15.4 oz)   05/01/23 (!) 165.1 kg (364 lb)       Hemoglobin A1c (PERCENT)   Date Value   10/29/2022 4.8     #Gestational HTN    BP Readings from Last 3 Encounters:   08/14/23 138/68   05/21/23 139/80   05/16/23 134/70       #Depression  #Anxiety        08/14/2023     2:27 PM 06/24/2023    10:36 PM 05/23/2023     2:14 AM 05/12/2023     9:09 PM 05/01/2023     2:54 PM   GAD-7 Total   GAD-7 Total Score 4 3 5  0 9         08/14/2023     2:26 PM 06/24/2023    10:38 PM 05/23/2023     2:16 AM 05/12/2023     9:09 PM 05/01/2023     2:54 PM   PHQ-9 Total   PHQ-9 Total Score 8 1 2 1 10          .  The patient, with a history of gestational hypertension, ADHD, anxiety, depression, and post-gastric bypass surgery, presents with wrist pain, weight management issues, and the need for prescription refills. The patient reports a cyst-like issue in the wrist that has since disappeared but has left persistent pain, particularly when moving the thumb. The pain is constant and located on the medial aspect of the thumb. There is no reported tingling, numbness, or weakness in the hand.    The patient also discusses struggles with weight management post-pregnancy. Despite efforts to control diet and exercise, the  patient feels she is not making sufficient progress and is interested in exploring weight loss medications. The patient has previously discussed starting a weight loss medication but had to postpone due to pregnancy.    The patient also reports stable mood symptoms, managed with Lexapro 20mg  daily. The patient has been back on Lexapro since giving birth and reports no worsening of depression or anxiety symptoms. The patient also takes nifedipine for blood pressure management, which was diagnosed during the second trimester of pregnancy. The patient reports swelling in the feet when not taking the medication.    I obtained consent from the Patient or Surrogate Decision Maker, as well as from all individuals accompanying the patient, to record and utilize a transcription to assist with the creation of documentation of the visit. I  also obtained consent from any others recorded during the encounter.        Objective      Vitals      Flowsheet Row Most Recent Value   BP 138/68   Heart Rate 59   SpO2 99 %   Weight 163.3 kg (360 lb)              Physical Exam  Vitals reviewed.   Constitutional:       General: She is not in acute distress.     Appearance: She is well-developed.   HENT:      Head: Atraumatic.   Pulmonary:      Effort: Pulmonary effort is normal. No respiratory distress.   Neurological:      Mental Status: She is alert and oriented to person, place, and time.       .  MUSCULOSKELETAL: Tenderness to palpation on the medial aspect of the left wrist. Finkelstein test positive. Tinel and Phalen tests negative.        Review of Prior Testing       Assessment and Plan       Kayla Frey is a 52W w h/o prior tobacco use, PCOS, 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    Kayla Frey  Pain and tenderness on the medial aspect of the left wrist and base of left thumb. Finkelstein test positive. No tingling, numbness, or weakness in the hand.  Likely exacerbated by carrying baby in flexed wrist position. Counseled on conservative mgmt and next steps if sxs fail to improve.   -Wear a thumb spica splint daily for two weeks, then nightly for two weeks or until symptoms improve.  -Continue Tylenol up to 4 grams per day for pain (nsaids contraindicated)  -Apply diclofenac gel every 4-6 hours as needed.  -Rest and ice the wrist for inflammation.    MDD/GAD  Stable symptoms of depression and anxiety postpartum. Currently on Lexapro 20mg  daily. Was off of SSRI during pregnancy and re-started post-partum.   -Continue Lexapro 20mg  daily.  -Add Propranolol 10mg  as needed for anticipatory anxiety.    Herpes Simplex Virus  Positive serology in 2024, no current symptoms.  -Keep Valtrex on hand for potential outbreaks.  -Consider swabbing any new oral lesions to confirm diagnosis.    Hypertension  Recently diagnosed with gestational HTN during second trimester of pregnancy and currently taking nifedipine BID. Given barely at goal BP < 140/80 today and took nifedipine today, likely needs to be on BP med. Will switch over to once daily ARB and stop nifedipine. May be able to taper off as time passes further from post-partum status and looses weight  -Discontinue Nifedipine.  -Start Losartan 25mg  daily.  -Check metabolic panel in 1-2 weeks after starting Losartan.  -Schedule nurse visit in 2-3 weeks to check blood pressure and kidney function.    Obesity  Longstanding h/o obesity s/p laparoscopic sleeve gastrectomy (05/2020) now w sig weight gain since having baby. Zepbound previously denied by insurance. Patient has been trying to lose weight through diet and exercise. Interested in trying [place]. Discussed risks and benefits today, including fact that she is at increased risk for GI SEs given prior gastrectomy. She would like to proceed with medication  -Start Wegovy 0.25mg  every seven days.  - PA info routed to pharmacy team in separate encounter  - RN visit prior to  first dose  -Check in after three weeks  to assess tolerance and potential dose increase.    General Health Maintenance  -Administer COVID and flu vaccines today.  -Schedule follow-up visit to discuss cervical screening and weight loss medication.                  I reviewed external records from 2 providers outside my specialty or healthcare organization as summarized in the note.    I spent a total of 55 minutes on this patient's care on the day of their visit excluding time spent related to any billed procedures. This time includes time spent with the patient as well as time spent documenting in the medical record, reviewing patient's records and tests, obtaining history, placing orders, communicating with other healthcare professionals, counseling the patient, family, or caregiver, and/or care coordination for the diagnoses above.

## 2023-08-20 NOTE — Telephone Encounter (Signed)
 Note from Insurance:  Clinical rationale MUST be provided to continue with the PA review. Please provide clinical rationale for why patient requires an additional fill

## 2023-08-20 NOTE — Telephone Encounter (Signed)
 MEDICATION PRIOR AUTHORIZATION    Rx (name, strength, formulation): Wegovy 0.25mg /0.26mL    PA Status: Pending  Submitted on 08/20/23  Via CMM (Key: [id number]) as urgent    Insurance: [employer]  Member ID: [id number]   QTY and Day Supply: 53mL/28 days    Comments: advised Pt of PA submission via MyChart.     Cashus Halterman Angela Nevin

## 2023-08-26 NOTE — Telephone Encounter (Signed)
Dear [facility] team- what was meant by an "additional fill?" I believe there was only one prescription for wegovy ordered by me on 08/14/23    The clinical rational for the wegovy for this patient would be her severe obesity (current BMI of 56) with additional comorbidities including HLD, HTN and b/l knee pain with failure of structured weight loss program (previously enrolled in [place] weight mgmt clinic) as well as had sleeve gastrectomy completed in 05/2020 with persistent weight gain over last 3 years despite continued diet, exercise and weight loss efforts. I had completed the template of information previously requested by insurance companies (copied below). Please let me know if there was any additional information they are requesting     Thanks!    Prior info submitted as well:      Starting weight: 360 lb    Height: 170 cm    Starting BMI: 56.38    Is this patient enrolled in medical weight loss and unable to lose weight?   This patient has been enrolled in a medical weight loss program. This program includes lifestyle change counseling on reduced calorie diets, exercise, and behavioral therapy. Despite being in the program for over 6 months, this patient has not lost weight.    Enrolled in [place] weight loss program since 2021. 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   I have checked my patient for hypothyroidism. This patient's TSH level was normal on 12/17/22. I do not believe this patient has any other hormonal imbalance, like hypercortisolism, because this patient does not have any other signs or symptoms related to hypercortisolism (no moon facies, easy bruising, purple striae, muscle wasting, or dorsocervical fat pad)    What other medications for weight loss have been tried?  Has tried diet, exercise, bariatric surgery and supervised weight  loss program with  weight mgmt clinic. Previously prescribed zepbound which was denied by insurance    Why did they fail?  Ineffective    When were they tried? 2021- 2024    What comorbidities does this patient have HLD, HTN, knee pain     I, as the provider, attest that the patient has been counseled on ALL of the following:  --Realistic expectations of weight loss  --Need for continued calorie reduction and exercise, as able  --Chronic nature of weight-loss pharmacotherapy  --Serious safety concerns and common tolerability concerns with the requested product

## 2023-08-31 NOTE — Progress Notes (Signed)
Hello Referring Provider,    This patient Kayla Frey previously met with our Cancer Genetics clinic on 02/24/2022 and elected to move forward with germline genetic testing for hereditary cancer risk. We identified on a recent review of the patient's records that genetic testing was never continued or completed for this patient. This may have been for one of the following reasons:   An initial or second required DNA sample was not returned,  The patient did not respond to communication/follow up related to the testing, or   There has been miscommunication in the administrative steps of their testing process including insurance billing and testing-related costs   If the patient is still interested in genetic testing, a new genetic counseling referral will be needed as it has been over 6 months since they were initially seen. Additionally, this patient is welcome to recontact our department via [facility] or by telephone at [phone]. We apologize for the delay in reaching out to you regarding this case, and please feel free to contact us by telephone or fax if we can be of assistance.     Sincerely,  Glory Buff, MS, [facility] (she/her)  Pharmacist, hospital and Prevention Program  Phone: [phone]  Fax: [phone]

## 2023-09-10 MED ORDER — WEGOVY 0.5 MG/0.5 ML SUBCUTANEOUS PEN INJECTOR
0.50.5 mg/ mL | SUBCUTANEOUS | 0 refills | Status: DC
Start: 2023-09-10 — End: 2023-10-15

## 2023-09-10 NOTE — Telephone Encounter (Signed)
Placed new referral to cancer genetics on pt's behalf as last referral expired. Notified pt and genetics counselor    FH of cancer- father w colon cancer, maternal grandmother w cervical Ca and lung Ca and ovarian Ca, paternal GF w colon cancer, cousin w breast Ca, Maternal great grandmother w uterine Ca

## 2023-09-10 NOTE — Telephone Encounter (Signed)
Tolerating wegovy 0.25mg  q7d w/o SEs, increased to 0.5mg  q7d. Has follow up w me next mo

## 2023-09-10 NOTE — Telephone Encounter (Signed)
Not sure what to pend.

## 2023-09-29 NOTE — Telephone Encounter (Signed)
Reassuring given no red flag symptoms.   Reiterated ED for red flag symptoms below.     Advised and offered appt today.   Pt unable to make an appt today.   Made appt for tomorrow morning w/ Dr. Renaldo Reel.       Situation (symptoms, c/o):  This 38 y.o. female pt. is having bilat knee pain, swelling starting two weeks. R knee pain started about a week ago    Background (clinical history and diagnoses):    has a past medical history of Abnormal Pap smear of cervix (2023), Acne (11/23/2019), Anxiety (2021), Blood transfusion without reported diagnosis (05/2021), Calculus of gallbladder without cholecystitis without obstruction (11/21/2022), Chlamydia, Depression (09/2019), Female infertility, Medication management (09/22/2022), Menstrual bleeding problem (2013), Ovarian cyst, PCOS (polycystic ovarian syndrome), Psoriasis, Severe obesity (BMI >= 40) (CMS code), Sleep disorder (12/1998), STD (sexually transmitted disease) (08/2021), SUI (stress urinary incontinence, female) (10/1997), 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    She has no past medical history of Acute thromboembolism of deep veins of lower extremity (CMS code), Coronary artery disease, Gallbladder disease, Gout, Hypercholesterolemia, Migraine headache, or Osteoarthritis.    Assessment (of the triager):   All protocol questions were asked by RN. Only pertinent positives and pertinent negatives are documented    Pt has h/o chronic L knee pain.     TC to pt.   States knees are still the same since message was sent.   Feels very tight.   L knee has been giving her problems and is the worst > R.   It wasn't originally on R knee but did use it more d/t trying to alleviate L knee pain.   Able to bend bilat knees but walking up or down stairs, L knee has sharp pain.     Pain severity is defined as:  -Mild (1-3): doesnt interfere with normal activities  -Moderate (4-7): interferes with normal activities or awakes from sleep, limping  -Mild (8-10):  excruciating pain, unable to do any normal activities, unable to walk    Recommendations:    If YES to the following, call 911 now  No Sounds like a life-threatening emergency to the triager    If YES to the following, go to ED now (or to office with PCP approval):  No Pain or swelling in one calf  No Patient sounds very sick or weak to the triager    If YES to any of the following, go to office now:  No Cant move swollen joint at all  No Fever > 103 F  No Looks infected    If YES to any of the following, see today in office:  Yes SEVERE pain  No Very swollen joint  No Blistering rash in area of pain (i.e., dermatomal distribution or band or stripe)  No Looks like a boil, infected sore, or deep ulcer    Education and advice provided:  Knee pain after overuse:  Muscle strain and joint irritation are very common following vigorous activity (including sports like tennis and basketball, jogging, and certain types of work).  Local cold: apply a cold pack or ice bag (wrapped in a most towel) to the area for 20 minutes. Repeat in 1 hour, then every 4 hours while awake. Continue this for the first 48 hours after an overuse injury to reduce the swelling and pain.  Local heat: beginning 48 hours after an injury, apply a warm washcloth or heating pad for 10 minutes 3 times a  day to help increase circulation and improve healing.    Rest your knee for the next couple of days: avoid activities that worsen your pain. Reduce activities that that put a lot of strain on the knee joint (e.g., deep knee bends, stair climbing, running).    Pain medicines: take medicines as directed on your medication list or talk with your pharmacist about OTC options.    Expected course: if your knee pain does not get better during the next week or if it recurs, then you should make an appointment with your provider.    Call back if: knee pain persists longer than 7 days or you become worse.    Referred for appointment: Yes    Verbalized  Understanding:  Verbalizes understanding and intention to comply with instructions.    PER PROTOCOL FOR KNEE PAIN Adult Telephone Triage, Copyright 2000-2013 [employer]. Janee Morn  *Use this protocol if knee pain is the patients only symptom*

## 2023-09-30 ENCOUNTER — Ambulatory Visit
Admit: 2023-09-30 | Payer: MEDICAID | Attending: Resident | Primary: Student in an Organized Health Care Education/Training Program

## 2023-09-30 DIAGNOSIS — M25562 Pain in left knee: Secondary | ICD-10-CM

## 2023-09-30 MED ORDER — LOSARTAN 25 MG TABLET
25 | ORAL_TABLET | Freq: Every day | ORAL | 11 refills | 90.00000 days | Status: DC
Start: 2023-09-30 — End: 2024-01-20

## 2023-09-30 MED ORDER — CYANOCOBALAMIN (VIT B-12) 1,000 MCG TABLET
1000 mcg | Freq: Every day | ORAL | Status: DC
Start: 2023-09-30 — End: 2023-11-13

## 2023-09-30 NOTE — Addendum Note (Signed)
Addended by: Forrest Moron on: 09/30/2023 01:35 PM     Modules accepted: Level of Service

## 2023-09-30 NOTE — Patient Instructions (Signed)
Dear [name],    It was good to see you in clinic today. This document is your After Visit Summary, which includes what we discussed in clinic today, what the next steps are, and a list of your medications.     Things you should do after you leave clinic today:    Please follow up with [place] in 1 month to follow up on your blood pressure; please keep a log of blood pressures  I've referred you for physical therapy. Please call [phone] to schedule or by email at [email or web address].  Physical Therapy Chiropractor at [facility] ([address], [address])  Physical Therapy at [facility] ([address], [address])  Physical Therapy at [facility] [phone] [place] [address])      Please stop at the front desk on your way out to schedule follow up in 1 month or sooner if needed. Please don't hesitate to reach out if you have any additional questions or concerns.    Best regards,    Rosamaria Lints, MD  Phone: [phone]  Fax: [phone]  Address: 2nd floor, [address], [place], [place] [address]  Hours of operation: M-F 8:00am to 5pm    ----------------------------------------------------------------

## 2023-09-30 NOTE — Progress Notes (Signed)
Kayla Frey is a 38 y.o. female who presents with the following:    Chief concern: Knee Pain (Bilateral , more pain on left knee)       Subjective:      History of present illness:    #Left knee pain  Knee pain started 3 years ago, acutely worsened in the last 2 weeks  Persistent, improves with diclofenac gel, minimal improvement with Tylenol  Worse with activities such as walking and stairs  Over-reliance on right knee, now with some right knee pain as well  No fall, no trauma  More active these days, on feet a lot     #BP  Has been elevated at home reads  Has been on 25 mg losartan     Objective     BP (!) 158/103 (BP Location: Left upper arm, Patient Position: Sitting, Cuff Size: Large Adult)   Pulse 74   SpO2 98%   General:  Well-appearing, NAD  HEENT:  EOMI, No lid lag  Neuro:  AOx3, Normal gait  MSK:  Anterior knee pain with passive and active ROM, worse with extension, flexion, and inversion    Left knee:     (-) effusion, (-) warmth.   (-) TTP of medial and lateral joint lines, (-) TTP above and below medial and lateral joint lines.  (+) TTP of medial and lateral patellar facets,  ROM 0-140 degrees.     Right knee:     (-) effusion, (-) warmth.   (-) TTP of medial and lateral joint lines, (-) TTP above and below medial and lateral joint lines.  (-) TTP of medial and lateral patellar facets,  ROM 0-140 degrees.       Psych:  Normal affect, Normal insight    Relevant labs/imaging/other testing: None       Assessment & Plan     Kayla Frey is a 38 y.o. female with h/o PCOS, obesity s/p laparoscopic sleeve gastrectomy (05/2020), gestational hypertension presenting with acute on chronic left knee pain.    Visit Diagnoses       Acute pain of left knee - Primary    Overview     Acute on chronic. Previously with worsening L pain X 7-8 mo iso increasing exercise post bariatric surgery, (walking ~1 hour per day and using elliptical or treadmill ~1 hour per day) w/o gait instability, recent trauma or  weakness, most c/w patellofemoral syndrome based on overuse and exam findings. Acute episode in 08/2023 associated with increased use. On exam, no erythema/swelling c/f septic joint, no recent trauma, no e/o meniscus injury, no ligament laxity.    - Diclofenac gel and ice prn  - Tylenol prn (NSAID contraindicated given recent gastrectomy)  - Continue home knee exercises  - Compression knee brace when exercising  - Physical therapy for strengthening   [ ]  Consider left knee x-ray for evidence of osteoarthritis          Relevant Orders    Referral to Physical Therapy Destination: [facility]; Physical Therapy Specialty: Orthopedic; Indication: Acute        Essential hypertension    Overview     Per patient, began having hypertension during pregnancy. Per chart review, patient has had hypertension prior to pregnancy. Per patient, home blood pressure readings are consistently >130/80 on 25 mg losartan currently. Patient is working on lifestyle changes.  BP Readings from Last 3 Encounters:   09/30/23 (!) 158/103   08/14/23 138/68   05/21/23 139/80   Follow up  with PCP in 3 weeks to manage BP  Increased losartan from 25 mg to 50 mg given elevated BP today           Relevant Medications    losartan (COZAAR) 25 mg tablet           Discussed with attending Dr. Annell Greening.    Rosamaria Lints, MD  Resident (PGY-1)  Department of General Internal Medicine  [facility] [place]  09/30/2023

## 2023-10-15 MED ORDER — WEGOVY 0.5 MG/0.5 ML SUBCUTANEOUS PEN INJECTOR
0.50.5 | SUBCUTANEOUS | 0 refills | Status: DC
Start: 2023-10-15 — End: 2023-10-19

## 2023-10-15 NOTE — Telephone Encounter (Signed)
 Current pended order:  WEGOVY 0.5 mg/0.5 mL injection pen [Pharmacy Med Name: WEGOVY 0.5MG /0.5ML INJ (4 PENS)]  INJECT 0.5MG  UNDER THE SKIN EVERY 7 DAYS    Last time approved:  semaglutide, weight loss, (WEGOVY) 0.5 mg/0.5 mL injection pen  Inject 0.5 mg under the skin every 7 (seven) days  Dispense: 2 mL Refill: 0  [facility] 1545 2 by Fredia Beets, JESSICA ALCALAY on 09/10/23        Last office visit:    08/14/2023   Last video visit:     09/22/2022   Last scheduled telephone encounter: Visit date not found  Last telemedicne (IVV) visit:   Visit date not found  Last telephone encounter:    08/14/2023    Next appointment:  10/19/2023 Cottie Banda, MD    Last  BP:  BP Readings from Last 3 Encounters:   09/30/23 (!) 158/103   08/14/23 138/68   05/21/23 139/80       Last Labs:    Lab Results   Component Value Date    Creatinine 1.00 05/21/2023    Sodium, Serum / Plasma 139 05/21/2023    Potassium, Serum / Plasma 3.9 05/21/2023    Hemoglobin A1c 4.8 10/29/2022    Cholesterol, Total 198 03/05/2023    LDL Cholesterol 113 03/05/2023    Cholesterol, HDL 69 03/05/2023    Thyroid Stimulating Hormone 0.85 12/17/2022    Hemoglobin 10.7 (L) 05/21/2023    eGFRcr 75 05/21/2023    eGFR - low estimate 90 10/24/2020    eGFR - high estimate 104 10/24/2020       If patient has not been seen in clinic for 12 months or more I have routed encounter to the administrative team to book a follow-up appointment.

## 2023-10-19 ENCOUNTER — Ambulatory Visit
Admit: 2023-10-19 | Payer: MEDICAID | Attending: Student in an Organized Health Care Education/Training Program | Primary: Student in an Organized Health Care Education/Training Program

## 2023-10-19 ENCOUNTER — Ambulatory Visit: Admit: 2023-10-19 | Payer: MEDICAID | Primary: Student in an Organized Health Care Education/Training Program

## 2023-10-19 DIAGNOSIS — O132 Gestational [pregnancy-induced] hypertension without significant proteinuria, second trimester: Secondary | ICD-10-CM

## 2023-10-19 DIAGNOSIS — F419 Anxiety disorder, unspecified: Secondary | ICD-10-CM

## 2023-10-19 DIAGNOSIS — Z23 Encounter for immunization: Secondary | ICD-10-CM

## 2023-10-19 LAB — BASIC METABOLIC PANEL (NA, K, CL, CO2, BUN, CR, GLU, CA)
Anion Gap: 7 (ref 4–14)
Calcium, total, Serum / Plasma: 9.1 mg/dL (ref 8.4–10.5)
Carbon Dioxide, Total: 26 mmol/L (ref 22–29)
Chloride, Serum / Plasma: 107 mmol/L (ref 101–110)
Creatinine: 0.84 mg/dL (ref 0.55–1.02)
Glucose, non-fasting: 102 mg/dL (ref 70–199)
Potassium, Serum / Plasma: 4.4 mmol/L (ref 3.5–5.0)
Sodium, Serum / Plasma: 140 mmol/L (ref 135–145)
Urea Nitrogen, serum/plasma: 16 mg/dL (ref 7–25)
eGFRcr: 92 mL/min/{1.73_m2} (ref >59)

## 2023-10-19 MED ORDER — ESCITALOPRAM 20 MG TABLET
20 | ORAL_TABLET | Freq: Every day | ORAL | 3 refills | 30.00000 days | Status: DC
Start: 2023-10-19 — End: 2024-06-23

## 2023-10-19 MED ORDER — WEGOVY 1 MG/0.5 ML SUBCUTANEOUS PEN INJECTOR
1 | SUBCUTANEOUS | 1 refills | 28.00000 days | Status: DC
Start: 2023-10-19 — End: 2023-11-16

## 2023-10-19 MED ORDER — LORAZEPAM 1 MG TABLET
1 | ORAL_TABLET | Freq: Every day | ORAL | 0 refills | 10.00000 days | Status: DC | PRN
Start: 2023-10-19 — End: 2023-11-16

## 2023-10-19 MED ORDER — HUMAN PAPILLOMAVIRUS VACCINE,9-VALENT (PF) 0.5 ML IM SUSPENSION
0.5 | Freq: Once | INTRAMUSCULAR | Status: AC
Start: 2023-10-19 — End: 2023-10-19
  Administered 2023-10-19: 20:00:00 0 mL via INTRAMUSCULAR

## 2023-10-19 NOTE — Result Quicknote (Signed)
 I reviewed the recent tests. There is nothing concerning about the results.
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
## 2023-10-19 NOTE — Patient Instructions (Addendum)
 Dear [name],     It was good to see you in clinic today. This document is your After Visit Summary, which includes what we discussed in clinic today, what the next steps are, and a list of your medications.     Things you should do after you leave clinic today:    Cancer genetics appointment is on 06/06/24    INCREASE wegovy to 1mg  every 7 days. Let me know if doing okay on this dose and we bump you up    Continue losartan 25mg  one time daily     For the knees    Email: [email or web address]    Phone: [phone] (Because of the large number of calls, you may need to wait to speak with a scheduler)    This is a useful site by the [employer] [facility] which provides you with videos and instructions for how to do rehab exercises at home. I recommend you take a look and try some of the exercises they suggest for your type of injury     https://www.weaver.info/    Continue using the diclofenac gel and tylenol as needed    Please consider taking the following for pain medications:     TYLENOL  Regular strength: 650 mg every 4 to 6 hour. Max daily doses 4 g/day of tylenol may be used        OR  Extra strength: 1000 mg every 6 hours. Max daily doses 4 g/day of tylenol may be used    *If you have any known history of liver disease or liver issues, please do not take tylenol    Please make appointment with ortho hand    [facility]: [phone].       Please don't hesitate to reach out with any questions or concerns. MyChart is a good way to reach me or a colleague for non-urgent questions (usually a turn-around time of 3 days). For more urgent matters, it's best to call the clinic at [phone]. There is always a nurse on-call to triage questions during regular business hours and schedule same-day appointments, and there is a physician on call 24/7.      Please let me know if you have any questions or concerns going forward.     Best,    Park Liter, MD      Eating Healthy Foods:  Care Instructions  With every meal, you can make healthy food choices. Try to eat a variety of fruits, vegetables, whole grains, lean proteins, and low-fat dairy products. This can help you get the right balance of nutrients, including vitamins and minerals. Small changes add up over time. You can start by adding one healthy food to your meals each day.    Try to make half your plate fruits and vegetables, one-fourth whole grains, and one-fourth lean proteins. Try including dairy with your meals.   Eat more fruits and vegetables. Try to have them with most meals and snacks.   Foods for healthy eating        Fruits   These can be fresh, frozen, canned, or dried.  Try to choose whole fruit rather than fruit juice.  Eat a variety of colors.        Vegetables   These can be fresh, frozen, canned, or dried.  Beans, peas, and lentils count too.        Whole grains   Choose whole-grain breads, cereals, and noodles.  Try brown rice.  Lean proteins   These can include lean meat, poultry, fish, and eggs.  You can also have tofu, beans, peas, lentils, nuts, and seeds.        Dairy   Try milk, yogurt, and cheese.  Choose low-fat or fat-free when you can.  If you need to, use lactose-free milk or fortified plant-based milk products, such as soy milk.        Water   Drink water when you're thirsty.  Limit sugar-sweetened drinks, including soda, fruit drinks, and sports drinks.  Where can you learn more?  Go to [employer]  Enter T756 in the search box to learn more about "Eating Healthy Foods: Care Instructions."  Current as of: May 14, 2022  Content Version: 14.3   [address] [place].   Care instructions adapted under license by your healthcare professional. If you have questions about a medical condition or this instruction, always ask your healthcare professional. [place]rene [place], [facility] disclaims any warranty or liability for your use of this information.

## 2023-10-20 NOTE — Telephone Encounter (Signed)
 Pt was seen by PCP yesterday. Discussed bilat knee pain. Pasting notes below:    "Acute pain of both knees      Overview       Bilateral knee pain, likely patellofemoral syndrome. Pain exacerbated by standing, stairs, and weight-bearing activities. Physical exam shows tenderness to palpation and pain with certain movements. Diclofenac gel was initially helpful but is no longer effective. Discussed the importance of aggressive physical therapy and strengthening of the quads. If no improvement after 6 weeks of physical therapy, an MRI may be considered.  - Refer to physical therapy  - Provide home exercises for patellofemoral syndrome  - Order knee x-rays to rule out osteoarthritis  - Consider MRI if no improvement after 6 weeks of physical therapy"        TC to pt.   States overnight, she was laying down and back started hurting.   Sharp pain. Had a migraine as well and has been tossing and turning all night.   Will do as PCP advised yesterday.     Never felt this sharp pain on her knees.   To sit down in toilet and trying to pull herself up from the toilet, there's a sharp pain in center of knee caps on both sides.  Tylenol is not helping.   Unsure when migraine started.     Now has slight swelling on L knee on the outside. R knee more swelling. Swelling are sites that can trigger pain and now going up to lower back.     No issues w/ urination.     Discussed w/ PCP. Advised given new symptoms, best for pt to get seen at urgent care same day.   Informed pt and verbalized understanding. However, states she can't make it today. Taking care of daughter and not feeling ok enough to go out. Requesting urgent care appt tomorrow.   Set up appt for tomorrow evening at urgent care w/ Dr. Lissa Merlin at [facility].  PCP made aware.     Understands to try  to contact urgent care today if symptoms worsen or feels the need to be seen today

## 2023-10-21 NOTE — Telephone Encounter (Signed)
 This encounter was completed by a Tour manager from the [employer] of [employer] Perinatal Services Program (CPSP) and [employer] Care [facility]) team.     Follow up outreach attempt to [facility] for  ECM Check-in.  A MyChart message/letter has been sent to patient.   Patient will be outreached at a later time.         Lanette Hampshire  Health Care Navigator  Office of [facility]   [facility]

## 2023-11-02 ENCOUNTER — Ambulatory Visit
Admit: 2023-11-02 | Payer: MEDICAID | Attending: Hand Surgery | Primary: Student in an Organized Health Care Education/Training Program

## 2023-11-02 ENCOUNTER — Inpatient Hospital Stay: Admit: 2023-11-02 | Payer: MEDICAID | Primary: Student in an Organized Health Care Education/Training Program

## 2023-11-02 DIAGNOSIS — M25532 Pain in left wrist: Secondary | ICD-10-CM

## 2023-11-02 DIAGNOSIS — M654 Radial styloid tenosynovitis [de Quervain]: Secondary | ICD-10-CM

## 2023-11-02 MED ORDER — LIDOCAINE (PF) 10 MG/ML (1 %) INJECTION SOLUTION
101 | Freq: Once | INTRAMUSCULAR | Status: AC
Start: 2023-11-02 — End: 2023-11-02
  Administered 2023-11-02: 23:00:00 1 mL

## 2023-11-02 MED ORDER — DEXAMETHASONE SODIUM PHOSPHATE 4 MG/ML INJECTION SOLUTION
4 | Freq: Once | INTRAMUSCULAR | Status: AC
Start: 2023-11-02 — End: 2023-11-02
  Administered 2023-11-02: 23:00:00 4 mg

## 2023-11-02 NOTE — Progress Notes (Signed)
 Orthopaedic Hand and Upper Extremity Surgery New Patient Note     I had the pleasure of evaluating Kayla Frey in the [facility] today.     Chief Complaint: L wrist pain    History of Present Illness: Kayla Frey is a 38 y.o. year old RHD female who presents today for the first time to my clinic. PHMx PCOS, obesity s/p laparoscopic sleeve gastrectomy (05/2020), anxiety and OCD.      Per note on 10/19/23 with Dr. Fredia Beets:  Patient has Kayla Frey on the left side. She reports ongoing issues with her left hand, specifically the thumb, where she experiences sharp pain when pressure is applied or when she hits it the wrong way (x45months). This pain has persisted despite wearing a brace. At the time, it was discussed the possibility of a steroid injection and, if necessary, surgical intervention as a last resort. She was referred to ortho for possible steroid injection.    Duration of symptoms: ~7 months     Today, patient reports a persistent sharp pain in her left wrist. She has been wearing a left wrist brace, without relief. P    Of note, patient had a baby on 04/2023. She is not breastfeeding.     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), Abnormal Pap smear of cervix (2023), Acne (11/23/2019), AMA (advanced maternal age) primigravida 18+, unspecified trimester (12/16/2022), Anxiety (2021), Blood transfusion without reported diagnosis (05/2021), Calculus of gallbladder without cholecystitis without obstruction (11/21/2022), Calculus of gallbladder without cholecystitis without obstruction (11/21/2022), Chlamydia, Chlamydia infection during pregnancy (10/23/2022), cHTN (10/23/2022), Depression (09/2019), Depression (03/09/2023), Female infertility, Gestational hypertension (09/30/2023), Glucose intolerance (05/01/2023), Medication management (09/22/2022), Menstrual bleeding problem (2013), Ovarian cyst, PCOS (polycystic ovarian syndrome),  Psoriasis, Severe obesity (BMI >= 40) (CMS code), Sleep disorder (12/1998), STD (sexually transmitted disease) (08/2021), SUI (stress urinary incontinence, female) (10/1997), 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    She has no past medical history of Acute thromboembolism of deep veins of lower extremity (CMS code), Arthritis, Asthma, Baker's cyst, Bleeding disorder (CMS code), Blood disorder, Bone cyst, Bursitis, Cancer (CMS code), CHF (congestive heart failure) (CMS code), Chronic kidney disease, Clotting disorder (CMS code), Complication of anesthesia, COPD (chronic obstructive pulmonary disease) (CMS code), Coronary artery disease, Diabetes mellitus (CMS code), Fractures, Gallbladder disease, Ganglion cyst, GERD (gastroesophageal reflux disease), Glaucoma, Gout, Heart disease, Human immunodeficiency virus (HIV) disease (CMS code), Hypercholesterolemia, Intestinal disease, Kyphosis, Liver disease, Migraine headache, MRSA (methicillin resistant Staphylococcus aureus), Myocardial infarction (CMS code), Neuromuscular disorder (CMS code), Opioid dependence (CMS code), Osteoarthritis, Osteoporosis, Paget's disease of bone, Primary pulmonary hypertension (CMS code), Scoliosis, Seizures (CMS code), Sinus disorder, Skin disease, Spondylolisthesis, Stroke (CMS code), Substance abuse (CMS code), Thyroid disease, TIA (transient ischemic attack), or Ulcer of skin (CMS code).  The remainder of the patient's history was reviewed and is noncontributory to this illness or injury.    PSH:  Kayla Frey  has a past surgical history that includes LEEP and Bariatric Surgery.    Medications:  Current Outpatient Medications   Medication Sig Dispense Refill    acetaminophen (TYLENOL) 500 mg tablet Take 1 tablet (500 mg total) by mouth every 6 (six) hours as needed for Pain (Pain not relieved by  ibuprofen) 30 tablet 0    blood pressure monitor KIT Monitor blood pressure weekly in setting of preparing for pregnancy  1 kit 0    diclofenac (VOLTAREN) 1 % gel Apply 1 g topically every 6 (six) hours as needed for Pain to affected area on left wrist. 100 g 3    escitalopram oxalate (LEXAPRO) 20 mg tablet Take 1 tablet (20 mg total) by mouth daily 90 tablet 3    LORazepam (ATIVAN) 1 mg tablet Take 1 tablet (1 mg total) by mouth daily as needed for Anxiety (prior to PAP) 2 tablet 0    losartan (COZAAR) 25 mg tablet Take 2 tablets (50 mg total) by mouth daily 30 tablet 11    propranoloL (INDERAL) 10 mg tablet Take 1 tablet (10 mg total) by mouth daily as needed (Anxiety) 30 tablet 3    semaglutide, weight loss, (WEGOVY) 1 mg/0.5 mL injection pen Inject 1 mg under the skin every 7 (seven) days 2 mL 1    valACYclovir (VALTREX) 500 mg tablet Take 1 tablet (500 mg total) by mouth daily as needed (for oral herpes flare) 90 tablet 3    cyanocobalamin, Vitamin B12, 1,000 mcg tablet Take 2 tablets (2,000 mcg total) by mouth daily (Patient not taking: Reported on 11/02/2023)       No current facility-administered medications for this visit.       Allergies:  Kayla Frey is allergic to adhesive, iodine, shellfish derived, tree nuts, and morphine.    FH:  Her family history includes Alcoholism in her brother and maternal grandmother; Anxiety disorder in her brother; Asthma in her brother; Cancer in her maternal grandmother; Cancer - colon (age of onset: 58) in her father; Cancer - lung in her maternal grandmother; Cervical cancer in her cousin, maternal grandmother, and maternal great-grandmother; Colon cancer in her father; Colon cancer (age of onset: 47) in her paternal uncle; Colon cancer (age of onset: 23) in her paternal grandfather; Depression in her brother; Drug abuse in her father and mother; Early death in her paternal aunt; Eclampsia in her cousin; Heart disease in her father; High blood pressure in her father and paternal  aunt; Mental health problem in her brother, maternal grandmother, and mother; Mental illness in her brother, maternal grandmother, and mother; Miscarriages / [place] in her cousin; Obesity in her paternal aunt; Ovarian cysts in her mother; Suicidality in her brother; Uterine cancer in her maternal grandmother.  The remainder of the patient's history was reviewed and is noncontributory to this illness or injury.    SH:  Kayla Frey  reports that she quit smoking about 3 years ago. Her smoking use included cigarettes. She started smoking about 9 years ago. She has never used smokeless tobacco. She reports that she does not currently use alcohol. She reports that she does not use drugs.  The remainder of the patient's history was reviewed and is noncontributory to this illness or injury.    Occupation/Recreational Activities: N/a    Physical Exam:    Vitals:    11/02/23 1524   Weight: (!) 158.8 kg (350 lb)   Height: 5\' 7"      Wt Readings from Last 3 Encounters:   11/02/23 (!) 158.8 kg (350 lb)   10/19/23 (!) 158.8 kg (350 lb)   08/14/23 (!) 163.3 kg (360 lb)     Estimated body mass index is 54.82 kg/m as calculated from the following:    Height as of this encounter: 5\' 7" .    Weight as of this encounter: 158.8 kg (350 lb).  Estimated body surface area is 2.74 meters squared as calculated from the following:  Height as of this encounter: 5\' 7" .    Weight as of this encounter: 158.8 kg (350 lb).      MSK:  Left Upper Extremity:  Tenderness: Pain at the level of the first dorsal compartment  Positive Finkelstein test  Vascular: <2s capillary refill in all digits  Sensation: Intact to light touch in all digits    Imaging:  I personally reviewed and interpreted the radiographs obtained.  Multiple views of the L wrist demonstrates no significant bony abnormality      NCS/EMG:  None    Labs / Path:  None    Outside Records:  I reviewed records.    Procedures:  PROCEDURE NOTE: The benefits and risks of injection including but  not limited to persistent symptoms, infection, pain, tendon rupture, fat atrophy and skin bleaching were explained to the patient and they consented to the procedure. 4 mg of dexamethasone and 1cc of 1% lidocaine were then injected into the left wrist.  A band-aid was placed over the injection site.  There were no immediate complications.     Assessment:  1. Left De Quervain's     Corticosteroid injection administered today. Advised patient to wait about 6 weeks and if she does not have improvement in 6 weeks I have asked her to follow up with Dr. Daylene Posey to discuss an ultrasound guided injection. She may follow up with me on a PRN basis.     Plan:  The patient was educated extensively about their condition, including the pathology, etiology, natural history and treatment options. Non-operative measures as well as surgical interventions were both addressed. She voiced understanding, and all questions were answered.    I recommend initial treatment to include the following:   1. Steroid injection administered today without immediate complications  2. If no improvement in 6 weeks, patient can follow up with Dr. Daylene Posey to discuss an ultrasound guided injection   3. Follow up with me as needed      All of the patient's questions were answered and She agrees with this plan.        Althia Forts, MD MS  Associate Professor Orthopaedic Surgery  Hand, Elbow and Upper Extremity Surgery  [facility]  [address]nd floor  [place], [place] [address]     I, Merry Lofty am acting as a Neurosurgeon for services provided by Erby Pian, MD on 11/02/23, 3:32 PM.  The above scribed documentation as annotated by me accurately reflects the services I have provided.   Erby Pian, MD  11/10/2023 8:09 AM

## 2023-11-02 NOTE — Patient Instructions (Addendum)
 It was a pleasure seeing you in the [facility] today!    Plan   Steroid injection administered today without immediate complications  If no improvement in 6 weeks, patient can follow up with Dr. Daylene Posey to discuss an ultrasound guided injection   Follow up with me as needed

## 2023-11-16 ENCOUNTER — Ambulatory Visit: Admit: 2023-11-16 | Payer: MEDICAID | Primary: Student in an Organized Health Care Education/Training Program

## 2023-11-16 ENCOUNTER — Ambulatory Visit
Admit: 2023-11-16 | Payer: MEDICAID | Attending: Student in an Organized Health Care Education/Training Program | Primary: Student in an Organized Health Care Education/Training Program

## 2023-11-16 ENCOUNTER — Inpatient Hospital Stay: Payer: MEDICAID | Primary: Student in an Organized Health Care Education/Training Program

## 2023-11-16 DIAGNOSIS — N898 Other specified noninflammatory disorders of vagina: Secondary | ICD-10-CM

## 2023-11-16 DIAGNOSIS — G8929 Other chronic pain: Secondary | ICD-10-CM

## 2023-11-16 DIAGNOSIS — M25561 Pain in right knee: Secondary | ICD-10-CM

## 2023-11-16 MED ORDER — WEGOVY 1.7 MG/0.75 ML SUBCUTANEOUS PEN INJECTOR
1.7 mg/0.75 mL | SUBCUTANEOUS | 0 refills | Status: DC
Start: 2023-11-16 — End: 2023-12-23

## 2023-11-16 MED ORDER — LORAZEPAM 1 MG TABLET
1 | ORAL_TABLET | Freq: Every day | ORAL | 0 refills | Status: AC | PRN
Start: 2023-11-16 — End: ?

## 2023-11-16 NOTE — Patient Instructions (Signed)
 Please INCREASE the wegovy from 1mg  to 1.7mg  every 7 days    Please schedule a PAP with the gynecology department. I have sent in a new refill of the ativan to take prior to the exam    I have sent STI testing and we will let you know the results via mychart    Please follow up with orthopedics for a repeat steroid injection of the wrist if you have persistent symptoms

## 2023-11-16 NOTE — Result Quicknote (Signed)
 Already on trmt for BV. See prior mychart msg. C/G and trich are neg

## 2023-11-16 NOTE — Progress Notes (Signed)
 Subjective    Kayla Frey is a 38 y.o. female who presents with the following:    Chief Complaint              Gynecologic Exam                History of Present Illness     Kayla Frey is a 62W w h/o prior tobacco use, PCOS, obesity s/p laparoscopic sleeve gastrectomy (05/2020), anxiety and OCD who presents for follow up     10/19/23- clinic visit w me for weight loss, wrist injury and knee pain    #HTN  Losartan 25mg  one time daily  BP Readings from Last 3 Encounters:   11/16/23 (!) 149/85   10/19/23 126/70   09/30/23 (!) 158/103     #Obesity  Starting weight: 365 lbs  Wegovy 1mg  q7d --> increase?    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  Kayla Frey is a 38 year old female who presents for a routine Pap smear and follow-up on weight management with [facility].     Her last Pap smear was in January 2023, followed by a colposcopy in March 2023, which showed moderate dysplasia. She underwent a LEEP procedure in April 2023 for high-grade lesions with negative margins. She became pregnant shortly after the LEEP and has not had a Pap smear since then. She reports no new vaginal symptoms such as itchiness, pain, or discharge.    She is currently on Wegovy for weight management and is on the second week of the 1 mg dose. She has not experienced side effects such as nausea, bloating, or skin outbreaks from the injection. She initially weighed 365 pounds and has lost over 10 pounds, now weighing 353 pounds. She administers the medication every Monday.    She reports a history of wrist pain for which she received a corticosteroid injection that provided relief for about a week. The  pain has since returned, and she describes a burning sensation in her arm.    She is sexually active with men and has the same partner. She does not consistently use contraception, occasionally using condoms. No pain with urination or new vaginal discharge.    I obtained consent from the Patient or Surrogate Decision Maker, as well as from all individuals accompanying the patient, to record and utilize a transcription to assist with the creation of documentation of the visit. I also obtained consent from any others recorded during the encounter.        Objective      Vitals      Flowsheet Row Most Recent Value   BP 149/85   Heart Rate 76   SpO2 100 %   Weight 161 kg (355 lb)   Pain Score 5   Pain Loc KNEE              Physical Exam  Vitals reviewed.   Constitutional:       General: She is not in acute distress.     Appearance: She is well-developed.  HENT:      Head: Atraumatic.   Pulmonary:      Effort: Pulmonary effort is normal. No respiratory distress.   Genitourinary:     General: Normal vulva.      Vagina: Vaginal discharge present.      Rectum: Normal. Guaiac result negative.   Neurological:      Mental Status: She is alert and oriented to person, place, and time.           Review of Prior Testing       Assessment and Plan       Kayla Frey is a 26W w h/o prior tobacco use, PCOS, obesity s/p laparoscopic sleeve gastrectomy (05/2020), anxiety and OCD who presents for follow up     Visit Diagnoses       BMI 57    Overview     Established. Previously w Class III, severe, long-standing obesity. Failed various past diets (keto, paleo, atkins, starvation, diet pills), etc w steady weight gain since early 20s. Now s/p laparoscopic sleeve gastrectomy at [facility] (05/31/20) and followed by [facility]. Had lost over 50 lbs since surgery. Regained after recent pregnancy with difficulty losing excess weight  - Starting weight 365 lbs  - started wegovy 08/2023  - INCREASE wegovy 1mg  q7d--> 1.7mg    - current weight:  344--> has lost 10 lbs!   - cont follow up w [facility]   - cont follow up w nutrition  - cont daily post- gastrectomy dietary supplements  - cont daily exercise and frequent small meals  Wt Readings from Last 3 Encounters:   11/16/23 (!) 161 kg (355 lb)   11/02/23 (!) 158.8 kg (350 lb)   10/19/23 (!) 158.8 kg (350 lb)               Relevant Medications    semaglutide, weight loss, (WEGOVY) 1.7 mg/0.75 mL injection pen (Start on 12/31/2023)        Anxiety and depression    Overview     H/o anxiety and depression, currently well-controlled on daily SSRI  - cont lexapro 20mg  one time daily       09/30/2023     9:30 AM 08/14/2023     2:26 PM 06/24/2023    10:38 PM 05/23/2023     2:16 AM 05/12/2023     9:09 PM   PHQ-9 Total   PHQ-9 Total Score 6  8 1 2 1        Patient-reported         08/14/2023     2:27 PM 06/24/2023    10:36 PM 05/23/2023     2:14 AM 05/12/2023     9:09 PM 05/01/2023     2:54 PM   GAD-7 Total   GAD-7 Total Score 4 3 5  0 9              Relevant Medications    LORazepam (ATIVAN) 1 mg tablet        Acute pain of both knees    Overview     Bilateral knee pain, likely patellofemoral syndrome. Pain exacerbated by standing, stairs, and weight-bearing activities. Physical exam shows tenderness to palpation and pain with certain movements. Diclofenac gel was initially helpful but is no longer effective. Discussed the importance of aggressive physical therapy and strengthening of the quads. If no improvement after 6 weeks of physical therapy, an MRI may be considered.  - Refer to physical therapy  - Provide home exercises for  patellofemoral syndrome  - Order knee x-rays to rule out osteoarthritis  - Consider MRI if no improvement after 6 weeks of physical therapy             Essential hypertension    Overview     Began having hypertension during pregnancy. Per chart review, has had hypertension prior to pregnancy. Was previously started on nifedipine during pregnancy. Switched to ARB post-partum.   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 Will refer to gyn for more complex exam to complete repeat PAP  - recommend future paps w gyn               STI (sexually transmitted infection)    Overview     H/o chlamydia (08/09/21) and trichomonas (09/10/21), previosly completed 7 day course of doxycycline for chlamydia and 7 day course of metronidazole for trichomonas. Repeat testing recently performed (10/16/21) and confirmed negative, treated infections. HIV and HCG neg.   - repeat STI labs sent 11/16/23             Tommi Rumps Quervain's tenosynovitis    Overview     Persistent pain in the thumb and wrist despite conservative management with a brace. Pain is sharp and exacerbated by certain movements. Discussed the possibility of a steroid injection and, if necessary, surgical intervention as a last resort.  - 11/02/23- ortho hand- steroid inj completed, some relief for 1-2 weeks but now worsening, is going to contact them to repeat inj              Other Visit Diagnoses (not on the problem list)       Vaginal discharge    -  Primary    Relevant Orders    Vaginal Smear for Bacterial Vaginosis/Yeast    Trichomonas vaginalis RNA    Chlamydia Trachomatis/Neisseria Gonorrhoeae RNA    Abnormal cervical Papanicolaou smear, unspecified abnormal pap finding        Relevant Orders    Ambulatory Referral to Gynecology                        I reviewed external records from 2 providers outside my specialty or healthcare organization  as summarized in the note.    I spent a total of 40 minutes on this patient's care on the day of their visit excluding time spent related to any billed procedures. This time includes time spent with the patient as well as time spent documenting in the medical record, reviewing patient's records and tests, obtaining history, placing orders, communicating with other healthcare professionals, counseling the patient, family, or caregiver, and/or care coordination for the diagnoses above.

## 2023-11-17 LAB — CHLAMYDIA TRACHOMATIS/NEISSERIA GONORRHOEAE RNA
CT RNA: NOT DETECTED
Comments: NOT DETECTED
GC RNA: NOT DETECTED

## 2023-11-17 LAB — VAGINAL SMEAR FOR BACTERIAL VAGINOSIS/YEAST

## 2023-11-17 MED ORDER — METRONIDAZOLE 500 MG TABLET
500 | ORAL_TABLET | Freq: Two times a day (BID) | ORAL | 0 refills | Status: AC
Start: 2023-11-17 — End: 2023-11-24

## 2023-11-17 NOTE — Telephone Encounter (Signed)
 Prescribed 7-day course of metronidazole for BV

## 2023-11-17 NOTE — Addendum Note (Signed)
 Addended by: Cottie Banda on: 11/17/2023 02:37 PM     Modules accepted: Orders

## 2023-11-19 NOTE — Progress Notes (Signed)
 Please see the Mychart message reply for my assessment and plan.    This patient gave consent for this Medical Advice Message and is aware that it may result in a bill to their insurance, as well as the possibility of receiving a bill for a copay and/or deductible. They are an established patient, but are not seeking information exclusively about a problem treated during an in person or video visit in the last seven days. I did not recommend an in person or video visit within seven days of my reply.    I spent a total of 5 minutes on 11/19/23 reviewing the patients prior medical records and current request for medical advice, prescribing medications or ordering tests (if applicable), replying to the patient, and documenting the encounter.

## 2023-11-19 NOTE — Telephone Encounter (Signed)
 Shared x-ray results

## 2023-11-21 LAB — TRICHOMONAS VAGINALIS RNA
T. Vaginalis RNA: NOT DETECTED
T. Vaginalis RNA: NOT DETECTED

## 2023-11-30 NOTE — Progress Notes (Signed)
 Please see the Mychart message reply for my assessment and plan.    This patient gave consent for this Medical Advice Message and is aware that it may result in a bill to their insurance, as well as the possibility of receiving a bill for a copay and/or deductible. They are an established patient, but are not seeking information exclusively about a problem treated during an in person or video visit in the last seven days. I did not recommend an in person or video visit within seven days of my reply.    I spent a total of 5 minutes on 12/02/23 reviewing the patients prior medical records and current request for medical advice, prescribing medications or ordering tests (if applicable), replying to the patient, and documenting the encounter.

## 2023-11-30 NOTE — Addendum Note (Signed)
 Addended by: Roe Koffman ALCALAY on: 12/02/2023 03:19 PM     Modules accepted: Orders

## 2023-12-01 NOTE — Telephone Encounter (Signed)
 Please advise if pt is eligible and a copy of the form will be left in your red folder.

## 2023-12-03 NOTE — Telephone Encounter (Signed)
 Copied from CRM [id number]. Topic: [facility] - Letters / Form Request  >> Dec 03, 2023  1:53 PM Hsin-Mu L wrote:  FORM TEMPLATE    PATIENT NAME: Kayla Frey  DATE OF BIRTH: 1985-10-15  MRN: [id number]    HOME PHONE NUMBER:    [phone]    ALTERNATE PHONE NUMBER:       LEAVING A MESSAGE OK?:    Yes    TYPE OF FORM:  DMV   NEED FORM BY:     ASAP  MAIL FORM TO / FAX FORM TO / PICK UP FORM:     [address] [address] , [place] [address]       INSURANCE INFORMATION:  PAYOR: [facility]   PLAN: [facility]    MESSAGE / PROBLEM:     Patient is returning our call about her DMV form. She would like it to be mailed to her home address. Please assist,thanks      MESSAGE GENERATED BY [facility]  CRM NUMBER [phone]     12/03/2023,      1:53 PM

## 2023-12-03 NOTE — Telephone Encounter (Signed)
 Called pt, confirmed that we are mailing form to pt home address, placed in outgoing mail

## 2023-12-03 NOTE — Telephone Encounter (Signed)
 My portion of DMV app completed and placed in cubby to send to pt

## 2023-12-04 NOTE — Progress Notes (Signed)
 Please see the Mychart message reply for my assessment and plan.    This patient gave consent for this Medical Advice Message and is aware that it may result in a bill to their insurance, as well as the possibility of receiving a bill for a copay and/or deductible. They are an established patient, but are not seeking information exclusively about a problem treated during an in person or video visit in the last seven days. I did not recommend an in person or video visit within seven days of my reply.    I spent a total of 5 minutes on 12/04/23 reviewing the patients prior medical records and current request for medical advice, prescribing medications or ordering tests (if applicable), replying to the patient, and documenting the encounter.

## 2023-12-23 MED ORDER — WEGOVY 2.4 MG/0.75 ML SUBCUTANEOUS PEN INJECTOR
2.4 | SUBCUTANEOUS | 11 refills | 28.00000 days | Status: DC
Start: 2023-12-23 — End: 2024-01-20

## 2023-12-23 NOTE — Telephone Encounter (Signed)
 Instructed to increase wegovy 1.7--> 2.4mg  every 7d

## 2023-12-23 NOTE — Telephone Encounter (Signed)
 Current pended order:  WEGOVY 1.7 mg/0.75 mL injection pen [Pharmacy Med Name: WEGOVY 1.7MG /0.75ML INJ ( 4 PENS)]  INJECT 1.7MG  UNDER SKIN EVERY 7 DAYS    Last time approved:  semaglutide, weight loss, (WEGOVY) 1.7 mg/0.75 mL injection pen  Inject 1.7 mg under the skin every 7 (seven) days  Dispense: 3 mL Refill: 0  [facility]Z 1545 2 by Blanchard Bunk, JESSICA ALCALAY on 11/16/23        Last office visit:    11/16/2023   Last video visit:     09/22/2022   Last scheduled telephone encounter: Visit date not found  Last telemedicne (IVV) visit:   Visit date not found  Last telephone encounter:    08/14/2023    Next appointment:  02/15/2024 Janalee Mcmurray, MD    Last  BP:  BP Readings from Last 3 Encounters:   11/16/23 (!) 149/85   10/19/23 126/70   09/30/23 (!) 158/103       Last Labs:    Lab Results   Component Value Date    Creatinine 0.84 10/19/2023    Sodium, Serum / Plasma 140 10/19/2023    Potassium, Serum / Plasma 4.4 10/19/2023    Hemoglobin A1c 4.8 10/29/2022    Cholesterol, Total 198 03/05/2023    LDL Cholesterol 113 03/05/2023    Cholesterol, HDL 69 03/05/2023    Thyroid Stimulating Hormone 0.85 12/17/2022    Hemoglobin 10.7 (L) 05/21/2023    eGFRcr 92 10/19/2023    eGFR - low estimate 90 10/24/2020    eGFR - high estimate 104 10/24/2020       If patient has not been seen in clinic for 12 months or more I have routed encounter to the administrative team to book a follow-up appointment.

## 2024-01-04 MED ORDER — PROPRANOLOL 10 MG TABLET
10 | ORAL_TABLET | Freq: Every day | ORAL | 3 refills | 30.00000 days | Status: DC | PRN
Start: 2024-01-04 — End: 2024-05-03

## 2024-01-04 NOTE — Telephone Encounter (Signed)
 Current pended order:  propranoloL (INDERAL) 10 mg tablet [Pharmacy Med Name: PROPRANOLOL 10MG  TABLETS]  TAKE 1 TABLET(10 MG) BY MOUTH DAILY AS NEEDED FOR ANXIETY    Last time approved:  propranoloL (INDERAL) 10 mg tablet  Take 1 tablet (10 mg total) by mouth daily as needed (Anxiety)  Dispense: 30 tablet Refill: 3  [facility] 1545 2 by Blanchard Bunk, JESSICA ALCALAY on 08/14/23        Last office visit:    11/16/2023   Last video visit:     09/22/2022   Last scheduled telephone encounter: Visit date not found  Last telemedicne (IVV) visit:   Visit date not found  Last telephone encounter:    08/14/2023    Next appointment:  02/15/2024 Janalee Mcmurray, MD    Last  BP:  BP Readings from Last 3 Encounters:   11/16/23 (!) 149/85   10/19/23 126/70   09/30/23 (!) 158/103       Last Labs:    Lab Results   Component Value Date    Creatinine 0.84 10/19/2023    Sodium, Serum / Plasma 140 10/19/2023    Potassium, Serum / Plasma 4.4 10/19/2023    Hemoglobin A1c 4.8 10/29/2022    Cholesterol, Total 198 03/05/2023    LDL Cholesterol 113 03/05/2023    Cholesterol, HDL 69 03/05/2023    Thyroid Stimulating Hormone 0.85 12/17/2022    Hemoglobin 10.7 (L) 05/21/2023    eGFRcr 92 10/19/2023    eGFR - low estimate 90 10/24/2020    eGFR - high estimate 104 10/24/2020       If patient has not been seen in clinic for 12 months or more I have routed encounter to the administrative team to book a follow-up appointment.

## 2024-01-20 ENCOUNTER — Ambulatory Visit
Admit: 2024-01-20 | Payer: Medicaid (Managed Care) | Primary: Student in an Organized Health Care Education/Training Program

## 2024-01-20 DIAGNOSIS — N871 Moderate cervical dysplasia: Secondary | ICD-10-CM

## 2024-01-20 DIAGNOSIS — N911 Secondary amenorrhea: Secondary | ICD-10-CM

## 2024-01-20 LAB — POCT URINE PREGNANCY, >= 18 YEARS (~~LOC~~)
POCT Preg Test, Ur: POSITIVE — AB
Test Kit Lot Number: 946359

## 2024-01-20 MED ORDER — MEDROXYPROGESTERONE 10 MG TABLET
10 | ORAL_TABLET | Freq: Every day | ORAL | 3 refills | Status: DC
Start: 2024-01-20 — End: 2024-01-20

## 2024-01-20 MED ORDER — NIFEDIPINE ER 30 MG TABLET,EXTENDED RELEASE 24 HR
30 | ORAL_TABLET | Freq: Every day | ORAL | 11 refills | 30.00 days | Status: AC
Start: 2024-01-20 — End: 2025-01-19

## 2024-01-20 NOTE — Result Quicknote (Signed)
 I reviewed the recent tests. There is nothing concerning about the results.

## 2024-01-20 NOTE — Patient Instructions (Signed)
 Kayla Frey,    It was nice seeing you today! Here is a summary of the things we discussed:    We discussed your concerns about secondary amenorrhea (absence of menstrual periods):  - Your last menstrual period was in February, and you have not had a period since then. Based on your history, the most likely cause is polycystic ovary syndrome (PCOS), which is associated with irregular cycles, clinical signs of hyperandrogenism (e.g., hair growth in unusual areas), and obesity as a risk factor.  - Hormonal testing will be done to evaluate for any abnormalities with your thyroid or the hormonal axis between your brain and ovaries. Please go to the lab to complete these blood tests as soon as possible.  - You were prescribed Provera (a progestin) to take cyclically. If you do not have a period within 2 months, take one tablet daily for 10 days. This should induce a period and help shed the lining of your uterus, reducing your risk of pre-cancer or cancer. The prescription has been sent to your preferred [facility] pharmacy.  - If your periods with Provera are prolonged, heavy, or painful, or if you experience frequent menses, we can perform an ultrasound to evaluate for endometrial pathology.  - We discussed that continuing to lose weight with UYQIHK and a healthy diet may help you ovulate and resume regular cycles. You previously ovulated when your weight was around 315 pounds, so weight loss may improve your cycles and fertility.    We discussed your concerns about future pregnancy:  - You expressed interest in having another child in the future. When you are ready to try for pregnancy, we will stop any hormonal treatments. If your cycles do not return, we can refer you to a fertility specialist to help induce ovulation.    We discussed contraceptive options:  - You prefer to continue using condoms for birth control and declined other contraceptive methods at this time. We discussed that hormonal contraceptives, including  pills, patches, rings, shots, or an IUD, contain progestins that can help regulate your cycles and reduce the risk of uterine cancer. You may consider these options in the future if desired.    We discussed your concerns about a condom slipping off during intercourse:  - A careful pelvic exam today did not reveal any foreign body in the vagina. If you experience abnormal discharge, a foul odor, fever, or chills, please contact our office immediately.    We discussed your request for STI screening:  - Comprehensive STI testing was performed today, including swabs for gonorrhea and chlamydia, and blood tests for syphilis, HIV, and hepatitis. You will be notified of any abnormal results.    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 HPV-based testing was also performed. You will be notified of the results.    Follow-up:    - Please complete your lab work for hormonal testing as soon as possible.  - Continue taking Wegovy and following a healthy diet to support weight loss and improve your cycles.  - If you do not have a period within 2 months,   take Provera as prescribed. Contact our office if your periods are prolonged, heavy, or painful.  - We will check in again in 6 months to reassess your progress with weight loss and menstrual cycles. If there are any abnormalities in your lab results, we will contact you sooner.  -  If you have any questions or concerns, please reach out to our office.

## 2024-01-20 NOTE — Progress Notes (Signed)
 Subjective    Kayla Frey is a 38 y.o. 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 The patient gave birth in September of last year and did not breastfeed. Her menstrual cycles resumed the following month and were regular for three months. In February, she experienced a prolonged menstrual period lasting from the 4th to the 17th, with heavy bleeding and cramps resuming after the initial six days. Since then, she has not had a menstrual cycle. She has taken two negative pregnancy tests, the most recent being two weeks ago. She is sexually active and reports a recent incident where a condom was lost during intercourse; an ER visit did not reveal the condom. She denies abnormal vaginal discharge or odor.    Prior to pregnancy, the patient had irregular menstrual cycles requiring Provera to stop prolonged bleeding. After weight loss surgery, her cycles became regular, and she was able to conceive. She reports significant weight gain during pregnancy, reaching a peak of 380 lbs, and is currently on Wegovy, having lost approximately 20 lbs, with a current weight of 355 lbs. She denies hot flashes or vaginal dryness.    She expresses a desire for one more child and is concerned about her current  amenorrhea. She declines hormonal birth control due to previous experiences of worsened menstrual irregularities. She has a family history of reproductive issues, including her mother with polycystic ovaries, and her grandmother with uterine cancer. She reports mild hirsutism with "peach fuzz" and occasional strands of hair on her face.    Her pregnancy was uncomplicated, but her delivery was marked by a lack of dilation, leading to her daughter being born after 48 hours without amniotic fluid, with subsequent heart rate drops and two strokes. The patient denies postpartum depression and reports doing well postpartum.         I obtained consent from the Patient or Surrogate Decision Maker, as well as from all individuals accompanying the patient, to record and utilize a transcription to assist with the creation of documentation of the visit. I also obtained consent from any others recorded during the encounter.        Objective          Physical Exam    Appears well  Normal vulva, vagina, and cervix, no foreign body    Review of Prior Testing       Assessment and Plan       Assessment/Plan     # Positive pregnancy test  # Secondary amenorrhea (N91.1)  Patient has not had menses since February. Previous ultrasound during pregnancy did not reveal any structural abnormalities in  the uterus. Most likely diagnosis is PCOS given some evidence of clinical hyperandrogenism and obesity as a risk factor. Patient did become ovulatory again when she got her weight down to 315 lbs after bariatric surgery, though she did gain weight during pregnancy and is now at 355 lbs. She has started [facility] and has begun to lose weight again, I suspect if she continues to do so, menses will resume.  - Pregnancy test positive today. LMP 10/02/2023  - Will obtain serum HCG to assess timing of ultrasound / pregnancy confirmation visit  - She would like to continue the pregnancy if viable - discussed risks of short interval pregnancy, particularly  less than 6 months including SGA and preterm birth.  - Stop Wegovy and Losartan. Start Nifedipine 30mg  daily. Home blood pressure monitoring and can titrate as need. Low dose aspirin at 12 weeks.  - Taking Propranolol PRN only for anxiety, uses rarely, likely low risk. Anxiety stable on Lexapro daily  - Start prenatal vitamin with folic acid.    # Kayla Frey (cervical intraepithelial neoplasia Frey) (N87.1)  Patient has a history of Kayla Frey. Cervical examination today appeared normal.  - Performed Pap smear and HPV-based testing.  - Follow-up pending results    # Routine screening for STI (sexually transmitted infection) (Z11.3)  Patient requested comprehensive STI screening due to recent condom mishap.  - Ordered gonorrhea and chlamydia swabs.  - Ordered blood tests for syphilis, HIV, and hepatitis.    # Foreign body in vagina, initial encounter (T19.2XXA)  Patient concerned about a retained condom. Careful examination revealed no foreign body.  - Advised patient to monitor for any signs of infection such as pain, odor, fevers, or chills and to report back if symptoms occur.                         I spent a total of 30 minutes on this patient's care on the day of their visit excluding time spent related to any billed procedures. This time includes time spent with the patient as well as time spent documenting in the medical record, reviewing patient's records and tests, obtaining history, placing orders, communicating with other healthcare professionals, counseling the patient, family, or caregiver, and/or care coordination for the diagnoses above.

## 2024-01-21 LAB — TRICHOMONAS VAGINALIS RNA
T. Vaginalis RNA: NOT DETECTED
T. Vaginalis RNA: NOT DETECTED

## 2024-01-21 LAB — SYPHILIS SCREEN BY RPR WITH REFLEX TO TREPONEMAL ANTIBODY: RPR: NONREACTIVE

## 2024-01-21 LAB — HEPATITIS B SURFACE ANTIGEN: Hep B surf Ag: NEGATIVE

## 2024-01-21 LAB — CHLAMYDIA TRACHOMATIS/NEISSERIA GONORRHOEAE RNA
CT RNA: NOT DETECTED
Comments: NOT DETECTED
GC RNA: NOT DETECTED

## 2024-01-21 LAB — HEPATITIS C ANTIBODY WITH REFLEX RNA: Hep C Ab, Qual: NEGATIVE

## 2024-01-21 LAB — HCG PREGNANCY, SERUM, QUANTITATIVE >= 18 YEARS: HCG Pregnancy, Serum, >= 18 years: <1 IU/L (ref <5)

## 2024-01-21 LAB — HIV ANTIBODY AND ANTIGEN COMBINATION TEST: HIV Ag/Ab Combo: NEGATIVE

## 2024-01-24 LAB — PRIMARY HPV W/ REFLEX CYTOLOGY
Primary HPV DNA, Genotype 16: NOT DETECTED
Primary HPV DNA, Genotype 18: NOT DETECTED
Primary HPV, HR (Non 16/18): NOT DETECTED

## 2024-02-18 NOTE — Telephone Encounter (Signed)
 This encounter was completed by a Tour manager from the [employer] of [employer] Perinatal Services Program (CPSP) and [employer] Care [facility]) team.     Follow up outreach attempt to [facility] for  ECM Check-in.  A MyChart message/letter has been sent to patient.   Patient will be outreached at a later time.         Lanette Hampshire  Health Care Navigator  Office of [facility]   [facility]

## 2024-04-08 NOTE — Progress Notes (Unsigned)
 Subjective    Kayla Frey is a 38 y.o. 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    11/16/23- last eval w me for obesity    11/2023- completed DMV form on her behalf  01/15/34- ED visit for retained condom  01/20/24- obgyn     #Positive pregnancy test    Per recent obgyn note  Patient has not had menses since February. Previous ultrasound during pregnancy did not reveal any structural abnormalities in the uterus. Most likely diagnosis is PCOS given some evidence of clinical hyperandrogenism and obesity as a risk factor. Patient did become ovulatory again when she got her weight down to 315 lbs after bariatric surgery, though she did gain weight during pregnancy and is now at 355 lbs. She has started Wegovy  and has begun to lose weight again, I suspect if she continues to do so, menses will resume.  - Pregnancy test positive today. LMP 10/02/2023  - Will obtain serum HCG to assess timing of ultrasound / pregnancy confirmation visit  - She would like to continue the pregnancy if viable - discussed risks of short interval pregnancy, particularly less than 6 months including SGA and preterm birth.  - Stop Wegovy  and Losartan . Start Nifedipine  30mg  daily. Home blood pressure monitoring and can titrate as need. Low dose aspirin  at 12 weeks.  - Taking Propranolol  PRN only for anxiety, uses rarely, likely low risk. Anxiety stable on Lexapro  daily  - Start prenatal vitamin with folic acid .    #Obesity  - started wegovy  08/2023--> stopped 12/2023 iso recent + pregnancy test  - Starting weight: 365 lbs  - last appt increased to 1.7mg      Wt Readings from Last 3 Encounters:   11/16/23 (!) 161 kg (355 lb)   11/02/23 (!) 158.8 kg (350 lb)   10/19/23 (!) 158.8 kg (350 lb)     #BV  - 10/2023- completed course for BV    #Knee pain  -    11/19/23  XR  No acute fractures. 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  Femorotibial joint:  *  Right: Mild joint space narrowing. Small osteophytes. KL score 2.  *  Left:  Mild joint space narrowing. Small osteophytes. KL score 2.  Femoropatellar joint: Bilateral joint space narrowing with patellar and trochlear osteophytes. Mild bilateral patellar tilt.  Compared to the prior exam: No priors.  No osteopenia.   Moderate bilateral suprapatellar joint effusion. No soft tissue abnormalities    #HTN  On nifedipine  started by obgyn  BP Readings from Last 3 Encounters:   11/16/23 (!) 149/85   10/19/23 126/70   09/30/23 (!) 158/103         {Optional Ambient HPI;F2 to Dxpe:45181}        Objective          Physical Exam  {Optional Ambient PE;F2 to Dxpe:45184}      Review of Prior Testing       Assessment and Plan       Kayla Frey is a 38 y.o.W w h/o prior tobacco use, PCOS, 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    {Assessment and plan options:32133}                {  Complexity of data reviewed (optional):28233::" "}    {Time spent options (optional):28235::" "}

## 2024-04-19 NOTE — Progress Notes (Signed)
 Please see the Mychart message reply for my assessment and plan.    This patient gave consent for this Medical Advice Message and is aware that it may result in a bill to their insurance, as well as the possibility of receiving a bill for a copay and/or deductible. They are an established patient, but are not seeking information exclusively about a problem treated during an in person or video visit in the last seven days. I did not recommend an in person or video visit within seven days of my reply.    I spent a total of 11 minutes on 04/22/24 reviewing the patients prior medical records and current request for medical advice, prescribing medications or ordering tests (if applicable), replying to the patient, and documenting the encounter.

## 2024-04-21 NOTE — Telephone Encounter (Signed)
 Filled out DMV form and place it on PCP red folder for review and signature.

## 2024-04-26 NOTE — Progress Notes (Unsigned)
 Called and spoke with patient regarding DMV form, which is ready for pickup. Patient requested the form be mailed to her. The form has been scanned.

## 2024-05-03 MED ORDER — PROPRANOLOL 10 MG TABLET
10 | ORAL_TABLET | Freq: Every day | ORAL | 3 refills | Status: AC | PRN
Start: 2024-05-03 — End: ?

## 2024-05-03 NOTE — Telephone Encounter (Signed)
 Current pended order:  propranoloL  (INDERAL ) 10 mg tablet [Pharmacy Med Name: PROPRANOLOL  10MG  TABLETS]  TAKE 1 TABLET(10 MG) BY MOUTH DAILY AS NEEDED FOR ANXIETY    Last time approved:  propranoloL  (INDERAL ) 10 mg tablet  TAKE 1 TABLET(10 MG) BY MOUTH DAILY AS NEEDED FOR ANXIETY  Dispense: 30 tablet Refill: 3  GEN [facility] MZ 1545 2 by ROD, JESSICA ALCALAY on 01/04/24        Last office visit:    11/16/2023   Last video visit:     09/22/2022   Last scheduled telephone encounter: Visit date not found  Last telemedicne (IVV) visit:   Visit date not found  Last telephone encounter:    08/14/2023    Next appointment:  Visit date not found    Last  BP:  BP Readings from Last 3 Encounters:   11/16/23 (!) 149/85   10/19/23 126/70   09/30/23 (!) 158/103       Last Labs:    Most recent relevant labs   Component Value Date    Creatinine 0.84 10/19/2023    Sodium, Serum / Plasma 140 10/19/2023    Potassium, Serum / Plasma 4.4 10/19/2023    Hemoglobin A1c 4.8 10/29/2022    Cholesterol, Total 198 03/05/2023    LDL Cholesterol 113 03/05/2023    Cholesterol, HDL 69 03/05/2023    Thyroid Stimulating Hormone 0.85 12/17/2022    Hemoglobin 10.7 (L) 05/21/2023    eGFRcr 92 10/19/2023    eGFR - low estimate 90 10/24/2020    eGFR - high estimate 104 10/24/2020       If patient has not been seen in clinic for 12 months or more I have routed encounter to the administrative team to book a follow-up appointment.

## 2024-05-04 NOTE — Progress Notes (Signed)
 ECM Disenrollment / Graduation Documentation Template  [employer] Pouch    Encounter type: Disenroll/Graduation  Reciprocal communication: No - outbound only  Date/time of service: 05/04/2024 2:33 PM  Best contact:  Documented ab:[email or web address] Charlie  [facility] Navigator)     Reason for Disenrollment  Patient declined continued ECM services     Summary of ECM Support Provided  ECM Start Date: [06/24/2023    Total ECM check-ins completed: [#] 4    Initial assessment completed: Yes    Care Plan reviewed and updated during enrollment: Yes    Areas supported:   Doula, Dental, or [employer] Resources     Final Outreach Summary  Final outreach attempt completed on :02/18/2024    Patient status at time of disenrollment:  Unable to reach despite multiple attempts

## 2024-05-16 MED ORDER — WEGOVY 2.4 MG/0.75 ML SUBCUTANEOUS PEN INJECTOR
2.4 | SUBCUTANEOUS | 3 refills | Status: AC
Start: 2024-05-16 — End: ?

## 2024-06-06 ENCOUNTER — Ambulatory Visit
Admit: 2024-06-06 | Discharge: 2024-06-06 | Payer: Medicaid (Managed Care) | Attending: MS" | Primary: Student in an Organized Health Care Education/Training Program

## 2024-06-06 DIAGNOSIS — Z7183 Encounter for nonprocreative genetic counseling: Secondary | ICD-10-CM

## 2024-06-06 NOTE — Progress Notes (Signed)
 GENETIC COUNSELING NOTE - INITIAL ENCOUNTER      Date of Service: 06/06/2024   Reason for Visit: Family history of colon cancer   Referring Provider: Harlene Reggie Delton, MD    I performed this evaluation using real-time telehealth tools, including a live video Zoom connection between my location and the patient's location. Prior to initiating, the patient consented to perform this evaluation using telehealth tools.    I had the pleasure of seeing Kayla Frey today for genetic counseling. The patient was unaccompanied to this appointment. [facility] Lexicographer, Odella Hamburg, participated in today's session.  _________________________________________________________________    SUMMARY / PLAN  At the conclusion of this visit, the patient chose to not proceed with germline genetic testing for hereditary cancer predisposition at this time.  Her family history that is suggestive of possible hereditary cancer is on her father's side of the family, and her father had negative genetic testing on an 84-gene multi-cancer panel.  The patient can contact me by MyChart is she learns about any other cancer diagnoses in her family.  _________________________________________________________________    PERSONAL HISTORY  Kayla Frey is a 38 y.o. female with the following relevant history:    Cancer History:  No personal history of cancer reported.    Other Medical History:  Ovaries and uterine intact    Cancer Screening:  Colon:  Colonoscopy: none, screening begins at age 36 for a person whose parent has colon cancer after age 76    Breast:  Mammogram: general population screening begins at age 54    Cervical:  Pap smear: yes, history of abnormal pap about 2 years ago, s/p LEEP, HPV negative on 01/20/24     FAMILY HISTORY  Family history has been reviewed. Reported family history of cancer is summarized below. Medical records for family members were not available for review unless otherwise noted.     Family  History       Relation Problem Comments    Mother - Mom Metallurgist) Drug abuse crack cocaine   Mental health problem unknown type   Mental illness    Ovarian cysts        Father - Dad (Deceased) Cancer - colon (Age: 90) d.58, liver failure, worked on planes for the [employer]   Colon cancer    Drug abuse crack cocaine   Heart disease    High blood pressure        Brother - Brother (Alive) Alcoholism    Anxiety disorder    Asthma    Depression    Mental health problem possible bipolar disorder   Mental illness    Suicidality attempted suicide       Paternal Aunt - Technical brewer (Alive) Early death    High blood pressure    Obesity        Paternal Uncle - archie Metallurgist) Colon cancer (Age: 33)        Maternal Grandmother - Jo ann (Deceased) Alcoholism    Cancer    Cancer - lung in 50s-60s, had pneumonia   Cervical cancer unknown age   Mental health problem unknown type   Mental illness    Uterine cancer        Paternal Grandfather - Legrand Metallurgist) Colon cancer (Age: 99) Navy seaman       Cousin (Alive) Eclampsia    Miscarriages / [place]        Cousin (Deceased) Cervical cancer from HPV       Maternal Great-Grandmother (Alive) Cervical  cancer                      Father's Genetic Test Report:        GENETICS AND CANCER  The majority of cancers happen by chance, often as part of the natural aging process.  Some cancers can be related to environmental exposures or behaviors (such as smoking).  A small portion of cancers are hereditary, meaning they are caused by a genetic risk factor passed down in a family from parent to child. Each person inherits thousands of genes from their mother and father. Some of these genes work to protect the body from developing cancer. If one of these protective genes is not working - described as a pathogenic variant or a mutation - this can increase the risk of developing certain types of cancer.       Some signs that there might be a genetic risk factor for cancer running in the family  include:  Cancer at young ages  Rare types of cancer  More than one type of cancer in the same person  Multiple people with cancer in a family, especially if they are the same kind of cancer      GENETIC RISK ASSESSMENT  Kayla Frey's paternal family history of colon cancer did appear suggestive of hereditary cancer predisposition, however, her father had comprehensive genetic testing with an 84-gene panel, which was negative, meaning no hereditary mutations were found.  Her maternal family history of cancer (lung, cervical) is not suggestive of hereditary cancer; these cancers are usually due to smoking and HPV, respectively, and not hereditary mutations.      Colorectal cancer:  Hereditary colon cancer could be caused by a mutation in the MLH1, MSH2, MSH6, PMS2 or EPCAM genes or a mutation in one of several other cancer susceptibility genes such as MUTYH, CHEK2, PTEN, POLE, POLD, or APC.   Her father's genetic test included all of the above genes and was negative.      GENETIC TESTING / MEDICAL NECESSITY  Although her father's side of the family meets NCCN criteria for genetic testing, her father already had genetic testing that is negative.  Because of this, genetic testing is not indicated for Kayla Frey at this time.    CANCER SCREENING AND PREVENTION  Screening recommendations can change over time and may also change if the family history were to change, and/or if a genetic mutation is identified in the family in the future. Family members should discuss recommendations with their healthcare providers in the context of their own personal risk factors and the cancer histories in their close relatives (if there is any).     Colorectal cancer screening:  Per the [employer] (NCCN), for individuals with a first-degree relative diagnosed at any age, begin colonoscopies by 40, or 10 years before the earliest diagnosis. Repeat every 5 years, or if positive repeat per colonoscopy findings.    For the  patient colonoscopies should therefore begin at 37 years of age.     Other cancer types:  The patient may follow general population screening guidelines for other common cancer types or follow the guidance of the patient's doctors for additional screening based on personal risk factors.     Thank you for allowing me to participate in Kayla Frey's care.  Please feel free to contact me with any questions or new information.      Sincerely,      Jolena Genoa, MMSc, CGC  Licensed [employer]  Counselor  Occupational psychologist Program  Clinic Phone [phone]  _________________________________________________________________    Time spent in direct consultation with the patient:  Start time: 12:29pm   End time: 12:55pm    GUIDELINES FOLLOWED: Genetic counseling was provided according to the guidelines and standards outlined by the American Society of Clinical Oncologists (ASCO), [employer] (NSGC), Occupational hygienist (NCCN) and the Commission on Cancer [facility]), where applicable.     FUTURE CORRESPONDENCE: Recommendations, family history analysis, and risk assessment are based on information available at this time.? Our knowledge of genetics and hereditary cancer predisposition syndromes is constantly changing and evolving.? New genes and associations continue to be discovered and characterized.? If there are any changes to the personal or family history, such as new diagnoses of tumors/cancer or new genetic test results, patients should inform their healthcare providers and re-contact Cancer Genetics as this may change the assessment and recommendations outlined above.?

## 2024-06-20 ENCOUNTER — Ambulatory Visit
Admit: 2024-06-20 | Payer: Medicaid (Managed Care) | Primary: Student in an Organized Health Care Education/Training Program

## 2024-06-20 ENCOUNTER — Ambulatory Visit
Admit: 2024-06-20 | Payer: Medicaid (Managed Care) | Attending: Nurse Practitioner | Primary: Student in an Organized Health Care Education/Training Program

## 2024-06-20 DIAGNOSIS — Z23 Encounter for immunization: Secondary | ICD-10-CM

## 2024-06-20 DIAGNOSIS — Z113 Encounter for screening for infections with a predominantly sexual mode of transmission: Secondary | ICD-10-CM

## 2024-06-20 LAB — HIV 1,2 COMBO ANTIGEN/ANTIBODY: HIV Ag/Ab Combo: NEGATIVE

## 2024-06-20 MED ORDER — FLU VACCINE TS 2025-26(6MOS UP)(PF) 45 MCG(15MCG X3)/0.5 ML IM SYRINGE
45 | Freq: Once | INTRAMUSCULAR | Status: AC
Start: 2024-06-20 — End: 2024-06-20
  Administered 2024-06-20: 16:00:00 0 mL via INTRAMUSCULAR

## 2024-06-20 MED ORDER — PNEUMOCOCCAL 21-VALENT CONJ VACCINE-DIP CRM (PF) 0.5 ML IM SYRINGE
0.5 | Freq: Once | INTRAMUSCULAR | Status: AC
Start: 2024-06-20 — End: 2024-06-20
  Administered 2024-06-20: 16:00:00 0 mL via INTRAMUSCULAR

## 2024-06-20 NOTE — Progress Notes (Signed)
 APP Visit Information:   APP Service Type:  Independent  Available MD consultant:  Cena NOVAK. Lynford, MD         I attest that I have verbally informed the patient that I am an Advanced Practice Provider and explained my role in their team-based care.    Subjective    Starsha Morning is a 38 y.o. female who presents with the following:           History of Present Illness     History of tension headache, palpitations, hypertension, varicose veins of right lower extremity, BMI 57, PCOS, mixed stress and urge urinary inconti, irregular MC, De Quervain's tenosynovitis, STI, marijuana use, hair loss, GAD    .  Altha Sweitzer is a 38 year old female who presents for an STD test after a condom mishap during intercourse.    Sexual exposure and risk assessment  - Condom malfunction occurred during intercourse approximately 1.5 weeks ago, with the condom coming off and subsequently retrieved  - Requests comprehensive sexually transmitted infection (STI) testing, including HIV, syphilis, hepatitis C, gonorrhea, and chlamydia  - Partner has no known sexually transmitted infections    Genitourinary symptoms  - No dysuria  - No abnormal vaginal bleeding    Menstrual and reproductive history  - Last menstrual period was last month; anticipates next period soon  - Acknowledges possibility of pregnancy but has experienced difficulty conceiving  - Has a one-year-old daughter    I obtained consent from the Patient or Surrogate Decision Maker, as well as from all individuals accompanying the patient, to record and utilize a transcription to assist with the creation of documentation of the visit. I also obtained consent from any others recorded during the encounter.        Objective          Physical Exam  Constitutional:       Appearance: Normal appearance. She is not toxic-appearing or diaphoretic.   Pulmonary:      Effort: Pulmonary effort is normal.   Neurological:      Mental Status: She is alert and oriented to person, place, and  time.             Review of Prior Testing       Assessment and Plan       .  Routine screening for STI (sexually transmitted infection)  New to me  Comment- No red flag. Requests STD screening after condom failure a week and a half ago. No symptoms. Partner reportedly STD-free.  Plan  -     Chlamydia Trachomatis/Neisseria Gonorrhoeae RNA; Future  -     HIV 1,2 Combo Antigen/Antibody; Future  -     Syphilis Screen by [facility] with Reflex to Treponemal Antibody; Future  -     ([facility]) Hepatitis C Antibody with Reflex RNA; Future  - Will send result note to MyChart and may need a follow up visit with concerned lab results.   - Advise to check pregnancy test if her up coming period missed.     Need for pneumococcal vaccination  Plan  -     pneumococcal 21-valent conjugate vaccine (PF) (PCV21, CAPVAXIVE) 0.5 mL injection syringe 0.5 mL    Need for influenza vaccination  Plan  -     influenza vaccine (age >/= 6 months) (PF) (FLULAVAL , FLUZONE , FLUARIX ) injection syringe 0.5 mL

## 2024-06-21 LAB — CHLAMYDIA TRACHOMATIS/NEISSERIA GONORRHOEAE RNA
CT RNA: NOT DETECTED
GC RNA: NOT DETECTED

## 2024-06-21 NOTE — Result Encounter Note (Signed)
 I reviewed the recent tests. There is nothing concerning about the results.

## 2024-06-23 MED ORDER — ESCITALOPRAM 20 MG TABLET
20 | ORAL_TABLET | Freq: Every day | ORAL | 1 refills | 30.00000 days | Status: AC
Start: 2024-06-23 — End: ?

## 2024-07-18 MED ORDER — DICLOFENAC 1 % TOPICAL GEL
1 | TOPICAL | 3 refills | 13.00000 days | Status: AC
Start: 2024-07-18 — End: ?

## 2024-07-27 MED ORDER — LAMOTRIGINE 25 MG TABLET
25 | Freq: Every day | ORAL | 2.00 refills | 30.00000 days | Status: AC
Start: 2024-07-27 — End: ?

## 2024-07-27 MED ORDER — SODIUM CHLORIDE 0.9 % IV BOLUS
0.9 | Freq: Once | INTRAVENOUS | Status: AC
Start: 2024-07-27 — End: 2024-07-27
  Administered 2024-07-28: 02:00:00 1000 mL via INTRAVENOUS

## 2024-07-27 MED ORDER — ONDANSETRON 4 MG DISINTEGRATING TABLET
4 | ORAL_TABLET | Freq: Three times a day (TID) | ORAL | 0 refills | 7.00000 days | Status: AC | PRN
Start: 2024-07-27 — End: ?

## 2024-07-27 MED ORDER — METOCLOPRAMIDE 5 MG/ML INJECTION SOLUTION
5 | Freq: Once | INTRAMUSCULAR | Status: AC
Start: 2024-07-27 — End: 2024-07-27
  Administered 2024-07-28: 03:00:00 10 mg via INTRAVENOUS

## 2024-07-27 MED ORDER — ARIPIPRAZOLE 5 MG TABLET
5 | Freq: Every day | ORAL | 1.00 refills | 30.00000 days | Status: AC
Start: 2024-07-27 — End: ?

## 2024-07-27 NOTE — ED Provider Notes (Signed)
 History of Present Illness   Chief Complaint  Chief Complaint   Patient presents with    Nausea     BIBA from home complaining of nausea since last night with epigastric pain. +vomiting. +Pregnancy last week and LMP 11/15.     History of Present Illness  Kayla Frey is a 38 y.o. female who presents to the emergency with a chief complaint of nausea and vomiting.  The patient states that about 9 PM last night she started to feel extremely nauseated and has been throwing up.  She is having some crampy upper abdominal pain.  The patient also has had some loose stools during this time.  She is not describing any pelvic or lower abdominal pain at this time.  She states that she had her last menstrual period on November 15.  She states that period came a week late and was lighter than normal so it seemed unusual to her.  She did check a home pregnancy test which was positive.  She states she is a G1P0.  No recent sick contacts with GI illness, unusual foods, travel, or recent antibiotics.  She states she takes nifedipine  for hypertension and is on 2 medications for bipolar, including Abilify .  She could not member the name of the second bipolar medication she takes.    Additional history from independent historians: EMS  Details: See triage note.      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      Abnormal Pap smear of cervix 2023    s/p LEEP    Acne 11/23/2019    New to me. Mild facial acne. Previously on topical retinoid but no longer   taking it.       AMA (advanced maternal age) primigravida 38+, unspecified trimester   12/16/2022    Anxiety 2021    Blood transfusion without reported diagnosis 05/2021    during vsg surgery at [place]    Calculus of gallbladder without cholecystitis without obstruction   11/21/2022    Calculus of gallbladder without cholecystitis without obstruction   11/21/2022     Pt found to have gallstones, no evidence of inflammation or infection.   Wants to avoid having surgery during the pregnancy.     [ ]  Nutrition   consult  [ ]  General Surgery referral if more symptoms       Chlamydia     Chlamydia infection during pregnancy 10/23/2022    10/23/22 attempted phone call - LM.   rx and MyChart message sent offering   expedited partner therapy - pt accepted and rx sent  10/2022 Negative test   of cure      cHTN 10/23/2022    Prepregnancy BP elevations: 143/64 07/26/21, 136/86 09/10/21, 152/92   10/16/21, 133/75 11/28/21  10/21/22 154/69, repeat 136/69 at initiation of   prenatal care. ASA & preE precautions discussed and ordered, as well as   preE baseline labs: nl     BP Goal: <140/90  Current Regimen: ASA 162mg ,   nifedipine  30 mg XL daily (as of 01/23/23)      01/23/23: BP >160 in clinic.   Asymptomatic. Reports similar at home. Gi    Depression 09/2019    Depression 03/09/2023    Female infertility     Gestational hypertension 09/30/2023    Glucose intolerance 05/01/2023  Elevated 1 hour  Declined 3-hour, instead checking fingersticks - all   normal      Medication management 09/22/2022    PA information for zepbound      Starting weight: 303 lb     Height: 172.1   cm     Starting BMI:46.5     Is this patient enrolled in medical weight   loss and unable to lose weight? This patient has been enrolled in a   medical weight loss program. This program includes lifestyle change   counseling on reduced calorie diets, exercise, and behavioral therapy.   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 If moderate   PTSD, offer trauma informed care plan  PCL5 > severe PTSD. 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 Pt prefers to take tablets instead of   capsules  - cont vitamin D  supplements  - follow up repeat vitamin D    levels in 3 mo             Vitamin D , 25-Hydroxy (ng/mL)      Date      Value      03/05/2023    32                      Surgical History    Past Surgical History:   Procedure Laterality Date    BARIATRIC SURGERY      LEEP                Family History    Family History   Problem Relation Name Age of Onset    Mental health problem Mother Mom         unknown type    Drug abuse Mother Mom         crack cocaine    Ovarian cysts Mother Mom     Mental illness Mother Mom     Cancer - colon Father Dad 7        d.58, liver failure, worked on planes for the [employer] blood pressure Father Dad     Drug abuse Father Dad         crack cocaine    Colon cancer Father Dad     Heart disease Father Dad     Depression Brother Brother     Anxiety  disorder Brother Brother     Mental health problem Brother Brother         possible bipolar disorder    Alcoholism Brother Brother     Asthma Brother Brother     Suicidality Brother Brother         attempted suicide    Mental illness Brother Brother     Cancer - lung Maternal Grandmother Idell ann         in 50s-60s, had pneumonia, +smoking history    Alcoholism Maternal Grandmother Idell ann     Cancer Maternal Grandmother Idell ann     Mental health problem Maternal Grandmother  Idell ann         unknown type    Cervical cancer Maternal Grandmother Idell ann 26        unknown age, before the lung cancer    Mental illness Maternal Grandmother Jo ann     Uterine cancer Maternal Grandmother Jo ann     Brain Aneurysm Paternal Grandmother  37        HTN    Colon cancer Paternal Grandfather Legrand [address]        Dealer, publishing copy marine    Early death Paternal Aunt Lonell     Obesity Paternal Wylie Lonell     High blood pressure Paternal Wylie Lonell     Colon cancer Paternal Uncle archie 51    Miscarriages / Stillbirths Cousin      Eclampsia Cousin      Cervical cancer Paternal Cousin          from HPV    Cervical cancer Maternal Great-Grandmother          or uterine cancer, had hysterectomy, [place] to [place], fertility issues   and infant deaths    Anesth problems Neg Hx                Social History     Socioeconomic History    Marital status: Single   Tobacco Use    Smoking status: Former     Current packs/day: 0.00     Types: Cigarettes     Start date: 09/14/2014     Quit date: 12/30/2019     Years since quitting: 4.5    Smokeless tobacco: Never    Tobacco comments:     taking chantix  to help with smoking cessation   Substance and Sexual Activity    Alcohol use: Yes     Alcohol/week: 0.0 - 1.0 standard drinks of alcohol     Comment: has used socially in past; no history of problem use    Drug use: Never     Comment: no history of Rx med abuse or illicit substance use    Sexual activity: Yes     Partners: Male     Birth control/protection: None     Comment: Pt denies pregnancy.  No activity for at least 1 mont     Social Drivers of Health     Financial Resource Strain: Low Risk (07/27/2024)    Overall Financial Resource Strain (CARDIA)     Difficulty of Paying Living Expenses: Not hard at all   Food Insecurity: No Food Insecurity (07/27/2024)    Hunger Vital Sign     Worried About Running Out of Food in the Last Year: Never true     Ran Out of Food in the Last Year: Never true   Transportation Needs: No  Transportation Needs (07/27/2024)    PRAPARE -  Therapist, Art (Medical): No     Lack of Transportation (Non-Medical): No   Housing Stability: Unknown (07/27/2024)    Housing Stability Vital Sign     Unable to Pay for Housing in the Last Year: No        Medications     Current Outpatient Medications   Medication Instructions    acetaminophen  (TYLENOL ) 500 mg, Oral, Every 6 Hours PRN    ARIPiprazole  (ABILIFY ) 5 mg, Daily Scheduled    blood pressure monitor KIT Monitor blood pressure weekly in setting of preparing for pregnancy    diclofenac  (VOLTAREN ) 1 % gel APPLY 1 GRAM TOPICALLY TO THE AFFECTED AREA ON LEFT WRIST EVERY 6 HOURS AS NEEDED FOR PAIN( GENERIC FOR VOLTAREN )    escitalopram  oxalate (LEXAPRO ) 20 mg, Oral, Daily Scheduled    lamoTRIgine  (LAMICTAL ) 25 mg, Daily Scheduled    LORazepam  (ATIVAN ) 1 mg, Oral, Daily PRN    NIFEdipine  (PROCARDIA  XL) 30 mg, Oral, Daily Scheduled    ondansetron  (ZOFRAN -ODT) 4 mg, Oral, Every 8 Hours PRN    propranoloL  (INDERAL ) 10 mg, Oral, Daily PRN    valACYclovir  (VALTREX ) 500 mg, Oral, Daily PRN    WEGOVY  2.4 mg, Subcutaneous, Every 7 Days Scheduled         Allergies     Allergies/Contraindications   Allergen Reactions    Adhesive Rash     Gets chemical burn like reaction, redness. Cannot use "plastic" type adhesive. Ok for 'cotton' bandages.     Iodine  Hives and Itching     IV Iodine .  Unsure about topical iodine     Shellfish Derived Anaphylaxis    Tree Nuts Anaphylaxis    Morphine  Nausea And Vomiting       Review of Systems   ROS: See HPI for Pertinent Responses    Physical Exam   Triage Vital Signs:   Temp: 36.9 C (98.4 F)  Heart Rate: 82  BP: (!) 166/92  *Resp: 20  SpO2: 99 % O2 Device: None (Room air)    Weight & BMI:    Weight: (!) 142.9 kg (315 lb)  BMI (Calculated): 49.4    O2 Saturation Interpretation:  The oxygen saturation is SpO2: 99 % [O2 Device: None (Room air)]    Physical Exam  General: Alert, uncomfortable appearing   HEENT: PERRL,  anicteric sclerae, moist mucous membranes   Neck: Supple, no rigidity  Cardiovascular: Regular rate, normal s1/2  Respiratory: Lungs CTAB, no wheezes/rales, normal work of breathing  Abdomen: Soft, mild epigastric tenderness, nondistended, no guarding/rigidity   GU: No suprapubic fullness, pelvic exam deferred  Extremities:  Warm and well perfused, 2+ pulses  Neuro: Normal mentation and speech  Skin: No rash, no diaphoresis  Psych: Calm, cooperative, normal affect       Data   Lab Tests Ordered and Reviewed  Labs Reviewed   Comprehensive Metabolic Panel   Result Value Ref Range    Sodium, Plasma/Serum 137 135 - 145 mmol/L    Potassium, Plasma/Serum 3.8 3.5 - 5.0 mmol/L    Chloride, Plasma / Serum 106 101 - 110 mmol/L    Carbon Dioxide, Total, Plasma / Serum 19 (L) 22 - 29 mmol/L    Anion Gap 12 4 - 14 mmol/L    Glucose, Non-Fasting, Plasma/Serum 112 70 - 199 mg/dL    Urea Nitrogen, Serum/Plasma 11 7 - 25 mg/dL    Creatinine, Plasma / Serum 0.77 0.57 - 1.11 mg/dL    eGFR 897 >40  mL/min/1.73 sq m    Calcium , Total, Plasma / Serum 9.0 8.4 - 10.5 mg/dL    Protein, Total, Plasma / Serum 7.9 6.3 - 8.6 g/dL    Albumin , Plasma / Serum 4.1 3.5 - 5.0 g/dL    Alanine Transaminase, Plasma / Serum 11 10 - 61 U/L    Aspartate Transaminase, Plasma / Serum 17 5 - 44 U/L    Alkaline Phosphatase, Plasma / Serum 62 38 - 108 U/L    Bilirubin, Total, Plasma/Serum 0.5 0.2 - 1.2 mg/dL   Lipase   Result Value Ref Range    Lipase, Plasma / Serum 12 8 - 78 U/L   HCG Pregnancy, Plasma/Serum, Quantitative   Result Value Ref Range    HCG Pregnancy, Quantitative, Plasma/Serum <1 <5 IU/L   Urinalysis with microscopy (R)    Specimen: Urine, clean catch   Result Value Ref Range    Glucose, UA Negative Negative mg/dL    Protein, UA 20 (A) Negative mg/dL    Bilirubin, UA Negative Negative mg/dL    Urobilinogen, UA Negative Negative mg/dL    pH, UA 7.0 5.0, 5.5, 6.0, 6.5, 7.0, 7.5, 8.0    Specific Gravity, UA 1.028 1.002 - 1.030    Blood, UA Negative  Negative mg/dL    Ketones, UA 40 (A) Negative mg/dL    Nitrites, UA Negative Negative    WBC Esterase, UA Negative Negative Leu/uL    WBCs, UA <5 <5 /HPF    RBCs, UA <3 <3 /HPF    Squamous Epithelial Cells >10 /LPF    Mucus Threads Present (A) (none)   CBC w/Auto Diff   Result Value Ref Range    WBC 10.2 (H) 3.4 - 10.0 x10E9/L    RBC 3.83 (L) 4.00 - 5.20 x10E12/L    Hemoglobin 11.0 (L) 12.0 - 15.5 g/dL    Hematocrit 66.3 (L) 36.0 - 46.0 %    MCV 88 80 - 100 fL    MCH 28.7 26.0 - 34.0 pg    MCHC 32.7 31.0 - 36.0 g/dL    RDWCV 86.6 88.2 - 85.5 %    Platelet Count 329 140 - 450 x10E9/L    Mean Platelet Volume (MPV) 10.1 9.1 - 12.6 fL    Nucleated RBCs 0.0 <=0.0 %    Preliminary ANC 9.39 (H) 1.80 - 6.80 x10E9/L    Neutrophils Absolute 9.39 (H) 1.80 - 6.80 x10E9/L    Lymphocytes Absolute 0.66 (L) 1.00 - 3.40 x10E9/L    Monocytes Absolute 0.12 (L) 0.20 - 0.80 x10E9/L    Eosinophils Absolute 0.00 0.00 - 0.40 x10E9/L    Basophils Absolute 0.01 0.00 - 0.10 x10E9/L    Immature Granulocytes Absolute 0.02 <0.10 x10E9/L   Boric Acid Extra Container   Result Value Ref Range    Extra Tube Yes        Imaging Results:  US  Female Pelvis Transabdominal and Transvaginal (Rad Performed)   Final Result      Limited transabdominal imaging only.      1. No evidence of ovarian torsion.      2.Complex heterogeneous lesion in the right ovary measuring 2.5 x 1.7 x 1.8 cm. Advise follow-up.      3. Modest cul-de-sac free fluid.      Report dictated by: Oliva Eye, MD, signed by: Oliva Eye, MD   Department of Radiology and Biomedical Imaging      US  Abdomen Complete   Final Result      1. Cholelithiasis: Multiple  large stones. No evidence of acute cholecystitis.       2. Borderline liver size with possible component of liver steatosis.      Report dictated by: Oliva Eye, MD, signed by: Oliva Eye, MD   Department of Radiology and Biomedical Imaging          Regulatory and Billing   Sepsis Documentation  [NO] Patient does not  have sepsis    Stroke Documentation  [NO] Patient does not have an acute stroke     Critical Care Documentation  [NO] The patient does not meet criteria for critical care    Medical Decision Making & ED Course   General Narrative:  Kayla Frey is a 38 y.o. female who presents to the emergency department with a chief complaint of nausea and vomiting and upper abdominal pain.  The patient has had a positive recent pregnancy test.  She states her last normal period was really in October, but she did have several days of light bleeding in November which is what prompted her to do the pregnancy test.  Differential diagnosis includes, but is not limited to, food poisoning, viral illness, gastroenteritis, gastritis, and gallbladder disease.  I independently interpreted her ED test results, which show nonspecific WBC count, mild anemia with a Hb of 11, a negative pregnancy test, and normal LFTs/lipase.  Her US  studies show gallstones but no cholecystitis and a complex appearing ovarian mass with no signs of torsion.  We gave the patient IVFs and an antiemetic.  Of note, I did speak to our pharmacist to ensure there would be no significant anticipated drug-drug interactions with her preexisting medications.  On recheck, she is much more comfortable appearing and tolerated POs.  I discussed her ED test results with her.  It appears she is not clearly pregnant based on our testing.  We discussed she would need to see a gynecologist about her US  finding.  She appears stable for discharge at this time.       ED Course  ED Course as of 08/04/24 1710   Karilyn POUR [facility] Documentation   Wed Jul 27, 2024   2248 Discussed with pharmacist.   2251 Patient reassessed and she appears much more comfortable.         Clinical Impressions as of 08/04/24 1710   Nausea vomiting and diarrhea   Ovarian mass   Gallstones       Complexity of Patient Management   Review of Records: Previous Hb levels in the EHR are similar to today's  testing.    Key differential diagnoses: See MDM.  Diagnoses considered not limited to those discussed here.    Management discussions with other Healthcare Providers: Hospital pharmacist.    Consideration of hospitalization / escalation or de-escalation of care: Admission would be considered if the patient was unable to tolerate POs.    Prescription drug management (given or considered):   Medications   0.9 % sodium chloride  bolus (0 mL Intravenous Ended 07/27/24 2259)   metoclopramide  (REGLAN ) injection 10 mg (10 mg Intravenous Given 07/27/24 1835)     Discharge Medications:  Discharge Medication List as of 07/27/2024 11:00 PM        START taking these medications    Details   ondansetron  (ZOFRAN -ODT) 4 mg rapid dissolve tablet Dissolve 1 tablet (4 mg total) in the mouth every 8 (eight) hours as needed for Nausea, Starting Wed 07/27/2024, Normal           Social determinants of health (describe):  The patient indicates she is single and currently not working.      Final Disposition     ED Disposition:   Discharge    ED Diagnosis:  1. Nausea vomiting and diarrhea    2. Ovarian mass    3. Gallstones        Follow Up:  Contact information for follow-up       Harlene Reggie Delton, MD   Specialty: Internal Medicine   Relationship: PCP - General     [facility] at [facility]  [address]   [place] [id number]   Phone: [phone]       Next Steps: Follow up    Instructions: You have gallstones and a lesion on your ovary.  Please see your primary care within 2 to 3 days and review all your ER test results, including the labs and ultrasound tests, with your provider at that visit.  Please see your gynecologist within one week to discuss your ovarian ultrsound result.             Earmon Sherrow K Kattaleya Alia, MD  08/04/24 [phone]

## 2024-07-28 LAB — COMPREHENSIVE METABOLIC PANEL
Alanine Transaminase, Plasma / Serum: 11 U/L (ref 10–61)
Albumin, Plasma / Serum: 4.1 g/dL (ref 3.5–5.0)
Alkaline Phosphatase, Plasma / Serum: 62 U/L (ref 38–108)
Anion Gap: 12 mmol/L (ref 4–14)
Aspartate Transaminase, Plasma / Serum: 17 U/L (ref 5–44)
Bilirubin, Total, Plasma/Serum: 0.5 mg/dL (ref 0.2–1.2)
Calcium, Total, Plasma / Serum: 9 mg/dL (ref 8.4–10.5)
Carbon Dioxide, Total, Plasma / Serum: 19 mmol/L — ABNORMAL LOW (ref 22–29)
Chloride, Plasma / Serum: 106 mmol/L (ref 101–110)
Creatinine, Plasma / Serum: 0.77 mg/dL (ref 0.57–1.11)
Glucose, Non-Fasting, Plasma/Serum: 112 mg/dL (ref 70–199)
Potassium, Plasma/Serum: 3.8 mmol/L (ref 3.5–5.0)
Protein, Total, Plasma / Serum: 7.9 g/dL (ref 6.3–8.6)
Sodium, Plasma/Serum: 137 mmol/L (ref 135–145)
Urea Nitrogen, Serum/Plasma: 11 mg/dL (ref 7–25)
eGFR: 102 mL/min/1.73 sq m (ref >59)

## 2024-07-28 LAB — CBC W/AUTO DIFF (LAB ONLY)
Basophils Absolute: 0.01 10*9/L (ref 0.00–0.10)
Eosinophils Absolute: 0 10*9/L (ref 0.00–0.40)
Hematocrit: 33.6 % — ABNORMAL LOW (ref 36.0–46.0)
Hemoglobin: 11 g/dL — ABNORMAL LOW (ref 12.0–15.5)
Immature Granulocytes Absolute: 0.02 10*9/L (ref <0.10)
Lymphocytes Absolute: 0.66 10*9/L — ABNORMAL LOW (ref 1.00–3.40)
MCH: 28.7 pg (ref 26.0–34.0)
MCHC: 32.7 g/dL (ref 31.0–36.0)
MCV: 88 fL (ref 80–100)
Mean Platelet Volume (MPV): 10.1 fL (ref 9.1–12.6)
Monocytes Absolute: 0.12 10*9/L — ABNORMAL LOW (ref 0.20–0.80)
Neutrophils Absolute: 9.39 10*9/L — ABNORMAL HIGH (ref 1.80–6.80)
Nucleated RBCs: 0 % (ref <=0.0)
Platelet Count: 329 10*9/L (ref 140–450)
Preliminary ANC: 9.39 10*9/L — ABNORMAL HIGH (ref 1.80–6.80)
RBC: 3.83 10*12/L — ABNORMAL LOW (ref 4.00–5.20)
RDWCV: 13.3 % (ref 11.7–14.4)
WBC: 10.2 10*9/L — ABNORMAL HIGH (ref 3.4–10.0)

## 2024-07-28 LAB — LIPASE: Lipase, Plasma / Serum: 12 U/L (ref 8–78)

## 2024-07-28 LAB — URINALYSIS WITH MICROSCOPY, REFLEX TO CULTURE
Bilirubin, UA: NEGATIVE mg/dL
Blood, UA: NEGATIVE mg/dL
Glucose, UA: NEGATIVE mg/dL
Ketones, UA: 40 mg/dL — AB
Nitrites, UA: NEGATIVE
Protein, UA: 20 mg/dL — AB
RBCs, UA: <3 /HPF (ref <3)
Specific Gravity, UA: 1.028 (ref 1.002–1.030)
Squamous Epithelial Cells: >10 /LPF
Urobilinogen, UA: NEGATIVE mg/dL
WBC Esterase, UA: NEGATIVE Leu/uL
WBCs, UA: <5 /HPF (ref <5)
pH, UA: 7

## 2024-07-28 LAB — URINE BORIC ACID EXTRA CONTAINER (LAB ONLY)

## 2024-07-28 LAB — HCG PREGNANCY, PLASMA/SERUM, QUANTITATIVE: HCG Pregnancy, Quantitative, Plasma/Serum: <1 IU/L (ref <5)

## 2024-07-28 MED FILL — METOCLOPRAMIDE 5 MG/ML INJECTION SOLUTION: 5 5 mg/mL | INTRAMUSCULAR | Qty: 2

## 2024-08-05 NOTE — Telephone Encounter (Signed)
 Please contact patient to set up an ED follow up visit in a week.

## 2024-08-11 NOTE — Telephone Encounter (Signed)
 Spoke w pt and scheduled follow up appt w NP Georgina 12/23

## 2024-08-16 NOTE — Progress Notes (Unsigned)
 APP Visit Information:   APP Service Type:  Independent  Available MD consultant:  Cena NOVAK. Lynford, MD    I spent a total of 40 non-overlapping minutes on this patient's care on the day of their visit excluding time spent related to any billed procedures. This time includes time spent with the patient as well as time spent documenting in the medical record, reviewing patient's records and tests, obtaining history, placing orders, communicating with other healthcare professionals, counseling the patient, family, or caregiver, and/or care coordination for the diagnoses above.       Subjective    Kayla Frey is a 38 y.o. female who presents with the following:      Kayla Frey is currently a patient of Kayla Reggie Delton, MD and is here being evaluated for an acute care visit.     History of Present Illness   ***  {Optional Ambient HPI;F2 to Skip:54818}     ROS     In this visit I completed an age- and gender-appropriate history and examination, counseled on anticipatory guidance and risk factor reduction, and ordered age- and risk-appropriate immunizations and laboratory/diagnostic procedures.  Objective      Patient Active Problem List   Diagnosis    BMI 57    Generalized anxiety disorder    Trauma in childhood    Irregular menstrual cycle    History of tobacco use    Family history of cancer    Marijuana use    Anxiety and depression    Healthcare maintenance    Acute pain of both knees    Hair loss    PCOS (polycystic ovarian syndrome)    ACP (advance care planning)    CIN II (cervical intraepithelial neoplasia II)    STI (sexually transmitted infection)    Mixed stress and urge urinary incontinence    Tension headache    Mass of right wrist    Asymptomatic varicose veins of right lower extremity    Palpitations, elevated BNP    Concern for LVH (left ventricular hypertrophy), resolved    Essential hypertension    De Quervain's tenosynovitis    History of abnormal cervical Pap smear     Current Outpatient  Medications on File Prior to Visit   Medication Sig Dispense Refill    acetaminophen  (TYLENOL ) 500 mg tablet Take 1 tablet (500 mg total) by mouth every 6 (six) hours as needed for Pain (Pain not relieved by ibuprofen ) 30 tablet 0    ARIPiprazole  (ABILIFY ) 5 mg tablet Take 1 tablet (5 mg total) by mouth daily      blood pressure monitor KIT Monitor blood pressure weekly in setting of preparing for pregnancy 1 kit 0    diclofenac  (VOLTAREN ) 1 % gel APPLY 1 GRAM TOPICALLY TO THE AFFECTED AREA ON LEFT WRIST EVERY 6 HOURS AS NEEDED FOR PAIN( GENERIC FOR VOLTAREN ) 100 g 3    escitalopram  oxalate (LEXAPRO ) 20 mg tablet Take 1 tablet (20 mg total) by mouth daily 90 tablet 1    lamoTRIgine  (LAMICTAL ) 25 mg tablet Take 1 tablet (25 mg total) by mouth daily      LORazepam  (ATIVAN ) 1 mg tablet Take 1 tablet (1 mg total) by mouth daily as needed for Anxiety (prior to PAP) 2 tablet 0    NIFEdipine  (PROCARDIA  XL) 30 mg 24 hr tablet Take 1 tablet (30 mg total) by mouth daily 30 tablet 11    ondansetron  (ZOFRAN -ODT) 4 mg rapid dissolve tablet Dissolve 1 tablet (4 mg  total) in the mouth every 8 (eight) hours as needed for Nausea 10 tablet 0    propranoloL  (INDERAL ) 10 mg tablet TAKE 1 TABLET(10 MG) BY MOUTH DAILY AS NEEDED FOR ANXIETY 30 tablet 3    semaglutide , weight loss, (WEGOVY ) 2.4 mg/0.75 mL injection pen Inject 2.4 mg under the skin every 7 (seven) days 9 mL 3    valACYclovir  (VALTREX ) 500 mg tablet Take 1 tablet (500 mg total) by mouth daily as needed (for oral herpes flare) 90 tablet 3     No current facility-administered medications on file prior to visit.     Allergies as of 08/16/2024  Review status set to Review Complete by Franky Galley, RN on 07/27/2024        Severity Noted Reaction Type Reactions    Adhesive High 01/08/2023    Rash    Gets chemical burn like reaction, redness. Cannot use "plastic" type adhesive. Ok for 'cotton' bandages.     Iodine  High 11/05/2018    Hives, Itching    IV Iodine .  Unsure about topical  iodine     Shellfish Derived High 12/07/2019    Anaphylaxis    Tree Nuts High 05/24/2020    Anaphylaxis    Morphine  Not Specified 11/05/2018   Intolerance Nausea And Vomiting                Physical Exam  {Optional Ambient PE;F2 to Dxpe:45184}    Review of Prior Testing  Recent Results (from the past 4 weeks)   Comprehensive Metabolic Panel    Collection Time: 07/27/24  6:05 PM   Result Value Ref Range    Sodium, Plasma/Serum 137 135 - 145 mmol/L    Potassium, Plasma/Serum 3.8 3.5 - 5.0 mmol/L    Chloride, Plasma / Serum 106 101 - 110 mmol/L    Carbon Dioxide, Total, Plasma / Serum 19 (L) 22 - 29 mmol/L    Anion Gap 12 4 - 14 mmol/L    Glucose, Non-Fasting, Plasma/Serum 112 70 - 199 mg/dL    Urea Nitrogen, Serum/Plasma 11 7 - 25 mg/dL    Creatinine, Plasma / Serum 0.77 0.57 - 1.11 mg/dL    eGFR 897 >40 fO/fpw/8.26 sq m    Calcium , Total, Plasma / Serum 9.0 8.4 - 10.5 mg/dL    Protein, Total, Plasma / Serum 7.9 6.3 - 8.6 g/dL    Albumin , Plasma / Serum 4.1 3.5 - 5.0 g/dL    Alanine Transaminase, Plasma / Serum 11 10 - 61 U/L    Aspartate Transaminase, Plasma / Serum 17 5 - 44 U/L    Alkaline Phosphatase, Plasma / Serum 62 38 - 108 U/L    Bilirubin, Total, Plasma/Serum 0.5 0.2 - 1.2 mg/dL   Lipase    Collection Time: 07/27/24  6:05 PM   Result Value Ref Range    Lipase, Plasma / Serum 12 8 - 78 U/L   HCG Pregnancy, Plasma/Serum, Quantitative    Collection Time: 07/27/24  6:05 PM   Result Value Ref Range    HCG Pregnancy, Quantitative, Plasma/Serum <1 <5 IU/L   CBC w/Auto Diff    Collection Time: 07/27/24  6:05 PM   Result Value Ref Range    WBC 10.2 (H) 3.4 - 10.0 x10E9/L    RBC 3.83 (L) 4.00 - 5.20 x10E12/L    Hemoglobin 11.0 (L) 12.0 - 15.5 g/dL    Hematocrit 66.3 (L) 36.0 - 46.0 %    MCV 88 80 - 100 fL    MCH  28.7 26.0 - 34.0 pg    MCHC 32.7 31.0 - 36.0 g/dL    RDWCV 86.6 88.2 - 85.5 %    Platelet Count 329 140 - 450 x10E9/L    Mean Platelet Volume (MPV) 10.1 9.1 - 12.6 fL    Nucleated RBCs 0.0 <=0.0 %     Preliminary ANC 9.39 (H) 1.80 - 6.80 x10E9/L    Neutrophils Absolute 9.39 (H) 1.80 - 6.80 x10E9/L    Lymphocytes Absolute 0.66 (L) 1.00 - 3.40 x10E9/L    Monocytes Absolute 0.12 (L) 0.20 - 0.80 x10E9/L    Eosinophils Absolute 0.00 0.00 - 0.40 x10E9/L    Basophils Absolute 0.01 0.00 - 0.10 x10E9/L    Immature Granulocytes Absolute 0.02 <0.10 x10E9/L   Urinalysis with microscopy (R)    Collection Time: 07/27/24  6:43 PM    Specimen: Urine, clean catch   Result Value Ref Range    Glucose, UA Negative Negative mg/dL    Protein, UA 20 (A) Negative mg/dL    Bilirubin, UA Negative Negative mg/dL    Urobilinogen, UA Negative Negative mg/dL    pH, UA 7.0 5.0, 5.5, 6.0, 6.5, 7.0, 7.5, 8.0    Specific Gravity, UA 1.028 1.002 - 1.030    Blood, UA Negative Negative mg/dL    Ketones, UA 40 (A) Negative mg/dL    Nitrites, UA Negative Negative    WBC Esterase, UA Negative Negative Leu/uL    WBCs, UA <5 <5 /HPF    RBCs, UA <3 <3 /HPF    Squamous Epithelial Cells >10 /LPF    Mucus Threads Present (A) (none)   Boric Acid Extra Container    Collection Time: 07/27/24  6:43 PM   Result Value Ref Range    Extra Tube Yes         Assessment and Plan       {Assessment and plan options:32133}                {Complexity of data reviewed (optional):28233::" "}  {Time spent options (optional):28235::" "}             I attest that I have verbally informed the patient that I am an Advanced Practice Provider and explained my role in their team-based care.      No follow-ups on file.  Your partner in health,  Elsie Alvina Ada, NP

## 2024-08-22 MED ORDER — VALACYCLOVIR 500 MG TABLET
500 | ORAL_TABLET | Freq: Every day | ORAL | 0 refills | Status: AC | PRN
Start: 2024-08-22 — End: ?

## 2024-09-28 NOTE — Progress Notes (Signed)
 Subjective    Kayla Frey is a 39 y.o. female who presents with the following:    Chief Complaint              Follow-up Follow up on the imaging fo cyst            History of Present Illness     .  Kayla Frey is a 39 year old female who presents to discuss an ovarian cyst    Approximately two months ago, she experienced severe abdominal pain, inability to retain food, sweating, and cramping in the abdominal area, prompting a visit to the emergency room at [facility]. During this visit, a cyst measuring approximately two and a half centimeters was identified on one of her ovaries, along with gallstones.    The pain during the ER visit was similar to severe cramps she experienced in her late teens and early twenties, described as 'unbearable, horrible.' These cramps had subsided in her late twenties and had not been as severe until the recent episode. She has not experienced similar pain or symptoms since the ER visit.    Her menstrual cycles are regular, occurring once a month, but she noted that her cycle was a week longer during the time of the ER visit. She currently uses condoms as her method of birth control and is not in need of other methods like Depo-Provera .    No recurrent pain or similar experiences since the initial ER visit.    I obtained permission from the Patient or Surrogate Decision Maker, as well as from all individuals accompanying the patient, to record and utilize a transcription to assist with the creation of documentation of the visit. I also obtained permission from any others recorded during the encounter.         Objective      Vitals      Flowsheet Row Most Recent Value   BP 129/60   Weight 138.3 kg (305 lb)         Physical Exam        Review of Prior Testing  US  FEMALE PELVIS TRANSABDOMINAL AND TRANSVAGINAL (RAD PERFORMED)       INDICATION: Vomiting, + home pregnancy test     COMPARISON: There are no prior related examinations at this institution. No prior outside examinations  have been provided for comparison review at this time.           TECHNIQUE: Ultrasound images were acquired through the pelvis via transabdominal imaging only.        IV CONTRAST:  None     FINDINGS:     Uterus: 8.8 x 4.2 x 4.4 cm. 80 cc      Endometrium:. 14 mm.     Right ovary: 3.6 x 1.9 x 2.3 cm. Complex heterogeneous lesion in the right ovary measuring 2.5 x 1.7 x 1.8 cm.Preserved venous and arterial flow.     Left ovary: 2.8 x 2.2 x 2.3 cm. Small follicles. Preserved venous and arterial flow.     Cul-de-sac. Modest free fluid present.     IMPRESSION:      Limited transabdominal imaging only.     1. No evidence of ovarian torsion.     2.Complex heterogeneous lesion in the right ovary measuring 2.5 x 1.7 x 1.8 cm. Advise follow-up.     3. Modest cul-de-sac free fluid.     Assessment and Plan       .  Right ovarian cyst  A 2.5 cm  right ovarian cyst identified in early December, associated with severe abdominal cramping and nausea, leading to an ER visit. No current pain or symptoms. Differential includes a functional cyst from ovulation, which can sometimes bleed and cause pain. If persistent, further evaluation may be needed to determine if removal is necessary. Hormonal treatment may be considered if cysts become recurrent.  - Ordered repeat ultrasound to assess the status of the ovarian cyst.  - Discussed potential use of birth control to prevent recurrence                      I spent a total of 20 minutes on this patient's care on the day of their visit excluding time spent related to any billed procedures. This time includes time spent with the patient as well as time spent documenting in the medical record, reviewing patient's records and tests, obtaining history, placing orders, communicating with other healthcare professionals, counseling the patient, family, or caregiver, and/or care coordination for the diagnoses above.

## 2024-09-28 NOTE — Patient Instructions (Addendum)
 Kayla Frey,    It was nice seeing you today! Here is a summary of the things we discussed:    Please schedule your pelvic ultrasound at the [facility] radiology department by calling [phone]. The order has been placed in your electronic chart, so you do not need to provide them with any paperwork.      If you have any questions, call [phone]
